# Patient Record
Sex: Male | Born: 1960 | Race: White | Hispanic: No | Marital: Single | Smoking: Current every day smoker
Health system: Southern US, Community
[De-identification: ages and names within clinical notes are randomized; demographics above are authoritative.]

## PROBLEM LIST (undated history)

## (undated) DIAGNOSIS — J439 Emphysema, unspecified: Secondary | ICD-10-CM

## (undated) DIAGNOSIS — I2510 Atherosclerotic heart disease of native coronary artery without angina pectoris: Secondary | ICD-10-CM

## (undated) DIAGNOSIS — Z789 Other specified health status: Secondary | ICD-10-CM

## (undated) DIAGNOSIS — I739 Peripheral vascular disease, unspecified: Secondary | ICD-10-CM

## (undated) DIAGNOSIS — E785 Hyperlipidemia, unspecified: Secondary | ICD-10-CM

## (undated) DIAGNOSIS — J449 Chronic obstructive pulmonary disease, unspecified: Secondary | ICD-10-CM

## (undated) DIAGNOSIS — I5189 Other ill-defined heart diseases: Secondary | ICD-10-CM

## (undated) DIAGNOSIS — Z720 Tobacco use: Secondary | ICD-10-CM

## (undated) HISTORY — DX: Tobacco use: Z72.0

## (undated) HISTORY — DX: Other ill-defined heart diseases: I51.89

## (undated) HISTORY — DX: Emphysema, unspecified: J43.9

## (undated) HISTORY — PX: BYPASS GRAFT: SHX909

## (undated) HISTORY — DX: Chronic obstructive pulmonary disease, unspecified: J44.9

## (undated) HISTORY — DX: Hyperlipidemia, unspecified: E78.5

## (undated) HISTORY — DX: Peripheral vascular disease, unspecified: I73.9

## (undated) HISTORY — DX: Atherosclerotic heart disease of native coronary artery without angina pectoris: I25.10

---

## 2003-08-18 ENCOUNTER — Other Ambulatory Visit: Payer: Self-pay

## 2011-12-07 ENCOUNTER — Ambulatory Visit: Payer: Self-pay | Admitting: Rheumatology

## 2013-02-03 ENCOUNTER — Ambulatory Visit: Payer: Self-pay | Admitting: Vascular Surgery

## 2013-02-03 LAB — CREATININE, SERUM: EGFR (Non-African Amer.): >60

## 2014-05-19 ENCOUNTER — Ambulatory Visit: Payer: Self-pay | Admitting: Vascular Surgery

## 2014-05-19 LAB — BASIC METABOLIC PANEL
Anion Gap: 5 — ABNORMAL LOW (ref 7–16)
BUN: 8 mg/dL (ref 7–18)
CHLORIDE: 106 mmol/L (ref 98–107)
CREATININE: 0.87 mg/dL (ref 0.60–1.30)
Calcium, Total: 8.8 mg/dL (ref 8.5–10.1)
Co2: 26 mmol/L (ref 21–32)
EGFR (African American): >60
EGFR (Non-African Amer.): >60
GLUCOSE: 89 mg/dL (ref 65–99)
Osmolality: 272 (ref 275–301)
Potassium: 4.8 mmol/L (ref 3.5–5.1)
Sodium: 137 mmol/L (ref 136–145)

## 2014-08-17 ENCOUNTER — Ambulatory Visit: Payer: Self-pay | Admitting: Vascular Surgery

## 2014-08-17 LAB — BASIC METABOLIC PANEL
ANION GAP: 4 — AB (ref 7–16)
BUN: 5 mg/dL — AB (ref 7–18)
Calcium, Total: 9 mg/dL (ref 8.5–10.1)
Chloride: 102 mmol/L (ref 98–107)
Co2: 30 mmol/L (ref 21–32)
Creatinine: 0.82 mg/dL (ref 0.60–1.30)
GLUCOSE: 104 mg/dL — AB (ref 65–99)
Osmolality: 270 (ref 275–301)
Potassium: 4.2 mmol/L (ref 3.5–5.1)
Sodium: 136 mmol/L (ref 136–145)

## 2014-08-17 LAB — CBC
HCT: 43.7 % (ref 40.0–52.0)
HGB: 14.7 g/dL (ref 13.0–18.0)
MCH: 30.4 pg (ref 26.0–34.0)
MCHC: 33.5 g/dL (ref 32.0–36.0)
MCV: 91 fL (ref 80–100)
PLATELETS: 263 10*3/uL (ref 150–440)
RBC: 4.83 10*6/uL (ref 4.40–5.90)
RDW: 14.3 % (ref 11.5–14.5)
WBC: 8.6 10*3/uL (ref 3.8–10.6)

## 2014-08-23 ENCOUNTER — Ambulatory Visit: Payer: Self-pay | Admitting: Vascular Surgery

## 2014-08-23 LAB — URINALYSIS, COMPLETE
BACTERIA: NONE SEEN
Bilirubin,UR: NEGATIVE
Blood: NEGATIVE
Glucose,UR: NEGATIVE mg/dL (ref 0–75)
Ketone: NEGATIVE
Leukocyte Esterase: NEGATIVE
Nitrite: NEGATIVE
PH: 6 (ref 4.5–8.0)
PROTEIN: NEGATIVE
RBC,UR: 3 /HPF (ref 0–5)
SPECIFIC GRAVITY: 1.021 (ref 1.003–1.030)
Squamous Epithelial: NONE SEEN
WBC UR: NONE SEEN /HPF (ref 0–5)

## 2014-08-23 LAB — PROTIME-INR
INR: 0.9
Prothrombin Time: 12.3 secs (ref 11.5–14.7)

## 2014-08-23 LAB — APTT: ACTIVATED PTT: 27.5 s (ref 23.6–35.9)

## 2014-08-25 ENCOUNTER — Inpatient Hospital Stay: Payer: Self-pay | Admitting: Vascular Surgery

## 2014-08-25 LAB — MRSA PCR SCREENING

## 2014-08-25 LAB — HEMATOCRIT: HCT: 37.9 % — ABNORMAL LOW (ref 40.0–52.0)

## 2014-08-25 LAB — HEMOGLOBIN: HGB: 12.5 g/dL — ABNORMAL LOW (ref 13.0–18.0)

## 2014-08-26 LAB — CBC WITH DIFFERENTIAL/PLATELET
BASOS ABS: 0.1 10*3/uL (ref 0.0–0.1)
Basophil %: 0.5 %
EOS ABS: 0 10*3/uL (ref 0.0–0.7)
Eosinophil %: 0.1 %
HCT: 35.7 % — ABNORMAL LOW (ref 40.0–52.0)
HGB: 11.9 g/dL — AB (ref 13.0–18.0)
Lymphocyte #: 1.7 10*3/uL (ref 1.0–3.6)
Lymphocyte %: 15.1 %
MCH: 30.3 pg (ref 26.0–34.0)
MCHC: 33.4 g/dL (ref 32.0–36.0)
MCV: 91 fL (ref 80–100)
MONO ABS: 1.2 x10 3/mm — AB (ref 0.2–1.0)
Monocyte %: 10.5 %
NEUTROS PCT: 73.8 %
Neutrophil #: 8.3 10*3/uL — ABNORMAL HIGH (ref 1.4–6.5)
Platelet: 157 10*3/uL (ref 150–440)
RBC: 3.93 10*6/uL — AB (ref 4.40–5.90)
RDW: 14 % (ref 11.5–14.5)
WBC: 11.3 10*3/uL — AB (ref 3.8–10.6)

## 2014-08-26 LAB — BASIC METABOLIC PANEL
ANION GAP: 8 (ref 7–16)
BUN: 9 mg/dL (ref 7–18)
CHLORIDE: 106 mmol/L (ref 98–107)
CO2: 26 mmol/L (ref 21–32)
Calcium, Total: 7.5 mg/dL — ABNORMAL LOW (ref 8.5–10.1)
Creatinine: 0.75 mg/dL (ref 0.60–1.30)
EGFR (African American): >60
EGFR (Non-African Amer.): >60
Glucose: 104 mg/dL — ABNORMAL HIGH (ref 65–99)
Osmolality: 278 (ref 275–301)
POTASSIUM: 4.1 mmol/L (ref 3.5–5.1)
Sodium: 140 mmol/L (ref 136–145)

## 2014-08-28 LAB — CBC WITH DIFFERENTIAL/PLATELET
Basophil #: 0.1 10*3/uL (ref 0.0–0.1)
Basophil %: 0.9 %
EOS ABS: 0.3 10*3/uL (ref 0.0–0.7)
Eosinophil %: 3.3 %
HCT: 34.3 % — AB (ref 40.0–52.0)
HGB: 11.7 g/dL — AB (ref 13.0–18.0)
LYMPHS ABS: 1.5 10*3/uL (ref 1.0–3.6)
LYMPHS PCT: 14.5 %
MCH: 30.4 pg (ref 26.0–34.0)
MCHC: 34 g/dL (ref 32.0–36.0)
MCV: 89 fL (ref 80–100)
Monocyte #: 1 x10 3/mm (ref 0.2–1.0)
Monocyte %: 10.3 %
NEUTROS ABS: 7.2 10*3/uL — AB (ref 1.4–6.5)
Neutrophil %: 71 %
Platelet: 165 10*3/uL (ref 150–440)
RBC: 3.84 10*6/uL — ABNORMAL LOW (ref 4.40–5.90)
RDW: 13.8 % (ref 11.5–14.5)
WBC: 10.2 10*3/uL (ref 3.8–10.6)

## 2014-08-28 LAB — BASIC METABOLIC PANEL
Anion Gap: 11 (ref 7–16)
BUN: 6 mg/dL — AB (ref 7–18)
Calcium, Total: 7.8 mg/dL — ABNORMAL LOW (ref 8.5–10.1)
Chloride: 103 mmol/L (ref 98–107)
Co2: 24 mmol/L (ref 21–32)
Creatinine: 0.65 mg/dL (ref 0.60–1.30)
EGFR (African American): >60
EGFR (Non-African Amer.): >60
Glucose: 72 mg/dL (ref 65–99)
Osmolality: 272 (ref 275–301)
Potassium: 3.6 mmol/L (ref 3.5–5.1)
Sodium: 138 mmol/L (ref 136–145)

## 2014-08-28 LAB — MAGNESIUM: MAGNESIUM: 1.6 mg/dL — AB

## 2015-02-04 NOTE — Discharge Summary (Signed)
PATIENT NAME:  [name], [name] MR#:  [id number] DATE OF BIRTH:  Mar 09, 1961  DATE OF ADMISSION:  02/03/2013 DATE OF DISCHARGE:  02/05/2013  DIAGNOSES: 1.  Atherosclerotic occlusive disease, bilateral lower extremities, with rest pain.  2.  Complication of vascular device.  3.  Tobacco abuse.   POSTOPERATIVE DIAGNOSES:  1.  Atherosclerotic occlusive disease, bilateral lower extremities, with rest pain.  2.  Complication of vascular device.  3.  Tobacco abuse.   PROCEDURES PERFORMED: 1.  Angiography, with angioplasty and stenting of the iliac arteries bilaterally, 02/03/2013.  2.  Angiography of the left lower extremity, with thrombectomy and angioplasty, 02/04/2013.   HISTORY OF PRESENT ILLNESS: The patient is a 54 year old gentleman who presented to the office with increasing pain in his left lower extremity. He had a history of angioplasty and stent placement in the iliac arteries at an outside institution, and was now found to have significant ischemia. The risks and benefits for re-intervention were discussed with the patient, and he wished to proceed.   HOSPITAL COURSE: On the day of admission he underwent successful angiography with recanalization of an occluded left common iliac stent. He also had a greater than 80% stenosis of the right external iliac. Common iliac lesions as well as the right external iliac lesion were treated with angioplasty and stent placement, as described in the operative reports. There were no immediate complications noted, however several hours after the study he was noted to have increased pain in his left leg, with coolness of the toes. His left leg remained warm, and on physical examination he had an easily palpable popliteal pulse as well as a femoral pulse, indicating patency of his recent intervention. He was started on Integrilin and observed overnight. It did not improve, and was returned to the angiography suite yesterday, 02/04/2013, where several  atherosclerotic lesions were identified as well as a probable small embolism associated with his iliac intervention. He underwent successful mechanical thrombectomy associated with t-PA infusion for lysis and then angioplasty of the distal popliteal tibioperoneal trunk as well as the posterior tibial. Following the procedure, he had a palpable posterior tibial at the ankle and his pain had resolved. He was observed overnight and continued on his Integrilin, which was stopped this morning. He is now pain-free and doing well. He is fit for discharge. He is discharged to home. He will follow up with me in the office in several weeks.   Diet is healthy-heart.   Is Plavix is added to his aspirin.   Tobacco cessation was stressed.    ____________________________ Anthony DillsGregory G. Anthony Wolf, White ggs:dm D: 02/05/2013 13:03:02 ET T: 02/05/2013 13:11:38 ET JOB#: [id number]  cc: Anthony DillsGregory G. Anthony Orr, White, <Dictator> Anthony LopeJeffrey D. Anthony SheenSparks, White Anthony DillsGREGORY G Anthony Eichel White ELECTRONICALLY SIGNED 02/23/2013 13:48

## 2015-02-04 NOTE — Op Note (Signed)
PATIENT NAME:  [name], [name] MR#:  [id number] DATE OF BIRTH:  09-17-1961  DATE OF PROCEDURE:  02/04/2013  PREOPERATIVE DIAGNOSES: 1.  Peripheral arterial disease with rest pain left lower extremity.  2.  Ischemia left lower extremity.  3.  Possible embolization.   POSTOPERATIVE DIAGNOSES:   1.  Peripheral arterial disease with rest pain left lower extremity.  2.  Ischemia left lower extremity.  3.  Embolization.   PROCEDURES PERFORMED: 1.  Left lower extremity angiography, third order catheter placement with additional third order.  2.  Percutaneous transluminal angioplasty of the left popliteal artery.  3.  Percutaneous transluminal angioplasty of the left posterior tibial artery.  4.  Mechanical thrombectomy of the popliteal artery.  5.  Mechanical thrombectomy of the posterior tibial artery on the left.  6.  Mechanical thrombectomy of the anterior tibial artery on the left.  7.  Infusion thrombolysis initial day.   SURGEON: Renford DillsGregory G Alante Weimann, MD   SEDATION: Versed 5 mg plus fentanyl 250 mcg administered IV. Continuous ECG, pulse oximetry and cardiopulmonary monitoring was performed throughout the entire procedure by the interventional radiology nurse. Total sedation time was 1 hour, 30 minutes.   ACCESS: A 6-French left common femoral artery, antegrade direction.   CONTRAST USED: Isovue 65 mL.   FLUOROSCOPY TIME:  10.5 minutes.   INDICATIONS: The patient is a 54 year old gentleman who underwent successful recanalization of his iliac artery. Last night he developed significant pain in his toes and on examination has maintained a 4+ popliteal pulse but does not have palpable pedal pulses. The risks and benefits for angiography were reviewed with the patient as well as his mother. All questions are answered, and he has agreed to proceed.   DESCRIPTION OF PROCEDURE: The patient is taken to special procedures and placed in the supine position. After adequate sedation is achieved,  the left groin is prepped and draped in a sterile fashion. Ultrasound is placed in a sterile sleeve. Ultrasound is utilized secondary to lack of appropriate landmarks and to avoid vascular injury. Under direct ultrasound visualization, the common femoral artery is identified, femoral bifurcation is also identified, and then the artery is scanned more proximally. A site estimated to be at least 2 to 3 cm above the bifurcation is selected. One percent lidocaine is infiltrated, and a Micropuncture needle is used to access the artery under direct visualization. Artery is pulsatile and echolucent, indicating patency. Image is recorded for the permanent record. Microwire is then advanced, Microsheath, J-wire followed by a 5-French sheath.  The 5-French sheath is found to be in the profunda, and it is then negotiated with a floppy Glidewire so that it is advanced into the SFA. Hand injection of contrast is then used to demonstrate the distal runoff down to the popliteal where there is an occlusion at the level of the tibial plateau, including the tibioperoneal trunk, origin of the anterior tibial and origin of the posterior tibial.   Heparin, 4000 units, is given. A wire and catheter are negotiated down and the lesion is crossed.  A Magic Torque Wire is exchanged, and 2 mg of cath flow are then reconstituted in 50 mL total volume, and this is laced through the occluded segment using the AngioJet. A 30-minute dwell time is allowed for thrombolysis using the TPA.   Multiple passes are then made across the lesion with the AngioJet in an aspiration. Total volume was 55 mL.  Follow-up demonstrated residual material present, and a 3 and subsequently a 4  mm balloon were used to angioplasty this area. Following angioplasty, there was still some residual material noted in the distal TP trunk.  A calcific stenosis was also identified in the TP trunk just above the origin of the posterior tibial, and this is what required the 4  mm balloon dilatation. Follow-up angiography now demonstrated there was thrombus within the AT and poor flow in the posterior tibial. Catheter and wire were then negotiated into posterior tibial representing first third order catheter placement, and subsequently a 3 mm balloon inflation was used in the mid posterior tibial within a stenotic area. A small amount of thrombotic material was also noted distally, and this was treated with AngioJet mechanical thrombectomy at the level of the malleolus. Follow-up imaging demonstrated significant improvement. Mechanical thrombectomy was then performed of the anterior tibial as well as the catheter was negotiated into the anterior tibial, representing the additional third order catheter placement. At the conclusion, there was flow in all 3 tibias.  There did appear to be some diffuse spasm, and a total of 400 mcg of nitroglycerin were administered intra-arterially.   Oblique view of the left groin was then obtained, and a Mynx device was deployed successfully.  Pressure was held and a safeguard was applied. The patient will be maintained overnight on an Integrilin drip.   INTERPRETATION: Initial views demonstrate that the common femoral, profunda femoris, and superficial femoral as well as the proximal 2/3 of the popliteal are widely patent. There is an occlusion which I am suspicious was a combination of calcific disease, as the lesion was identified on fluoroscopy, as well as a possible embolization from his earlier intervention. This was treated with both TPA infusion as well as a mechanical thrombectomy and a 4 mm balloon inflation. The anterior tibial and posterior tibial were both then treated with mechanical thrombectomy.  The posterior tibial had a lesion in its mid portion which was treated with a 3 mm balloon inflation.   SUMMARY: Successful salvage of left lower extremity as described above. At the conclusion of the procedure, the patient has a 4+ palpable  posterior tibial pulse.  ____________________________ Renford Dills, MD ggs:cb D: 02/04/2013 16:53:53 ET T: 02/04/2013 17:28:13 ET JOB#: [id number]  cc: Renford Dills, MD, <Dictator> Duane Lope. Judithann Sheen, MD Renford Dills MD ELECTRONICALLY SIGNED 02/23/2013 13:48

## 2015-02-04 NOTE — Op Note (Signed)
PATIENT NAME:  [name], [name] MR#:  [id number] DATE OF BIRTH:  20-Jan-1961  DATE OF PROCEDURE:  02/03/2013  PREOPERATIVE DIAGNOSIS: Atherosclerotic occlusive disease bilateral lower extremities, with rest pain of the left lower extremity and lifestyle-limiting claudication of the right.   POSTOPERATIVE DIAGNOSIS: Atherosclerotic occlusive disease bilateral lower extremities, with rest pain of the left lower extremity and lifestyle-limiting claudication of the right.  PROCEDURES PERFORMED: 1.  Abdominal aortogram.  2.  Pelvic angiography.  3.  Percutaneous transluminal angioplasty of the right common iliac artery.  4.  Percutaneous transluminal angioplasty and stent placement of the left common iliac artery.  5.  Percutaneous transluminal angioplasty and stent placement of the right external iliac artery.  6.  Bilateral StarClose devices common femoral arteries.   SURGEON: Levora Dredge, MD   SEDATION: Precedex drip with IV fentanyl. Continuous ECG, pulse oximetry and cardiopulmonary monitoring is performed throughout the entire procedure by the interventional radiology nurse. Total sedation time is 1 hour, 20 minutes.   ACCESS:  1.  A 7-French sheath, left common femoral artery.  2.  A 6-French sheath, right common femoral artery.   CONTRAST USED: Isovue 95 mL.   FLUOROSCOPY TIME: 8.0 minutes.   INDICATIONS: The patient is a 54 year old gentleman who presents with increasing pain of his left lower extremity and lifestyle-limiting claudication that has been a problem over the past many months bilaterally.  Physical examination as well as noninvasive studies confirm profound atherosclerotic occlusive disease, particularly on the left. The risks and benefits for angiography and possible intervention were reviewed. All questions were answered, and the patient agrees to proceed.  DESCRIPTION OF PROCEDURE: The patient is taken to special procedures and placed in the supine position. After  adequate sedation is achieved, both groins are prepped and draped in sterile fashion. Ultrasound is placed in a sterile sleeve. Ultrasound is utilized secondary to lack of appropriate landmarks and to avoid vascular injury. Under direct ultrasound visualization, access to the common femoral artery on the right is obtained. The common femoral artery is identified. It is pulsatile and echolucent, indicating patency. Image is recorded for the permanent record, and a Micropuncture needle is used to access the anterior wall under direct visualization, a Microwire followed by micro sheath, a J-wire followed by a 5-French sheath and a 5-French pigtail catheter. The pigtail catheter and J-wire are then advanced to the level of T12, and AP projection of the aorta is obtained. The pigtail catheter is then repositioned, and bilateral oblique views of the pelvis are obtained. It should be noted that with the sheath crossing the external iliac lesion it is occlusive. After imaging this, the sheath was repositioned so that it is below the level of the stenosis; 3000 units of heparin is given. The ultrasound previously used was maintained on the field. It is then used to image the left groin. The common femoral artery is identified. It is minimally pulsatile but it is echolucent, indicating patency. Patency is also noted from the angiography via collaterals reconstituting the external iliac and then filling a patent common femoral on the left. Image is recorded for the permanent record, and under direct ultrasound visualization a Microwire is utilized to access the common femoral artery, followed by a micro sheath, a J-wire followed by a 6-French sheath. Right side is upsized over the J-wire to a 6-French sheath as well.   Using a Stiff-Angled Glidewire and KMP catheter, the occluded common iliac artery and previously placed iliac stent are then crossed.  The  catheter is advanced into the aorta where blood is easily aspirated,  and hand injection of contrast demonstrates luminal filling.   Another 1000 units of heparin is given. Two 7 x 6 balloons are then used to dilate the right and left common iliac arteries simultaneously. Follow-up imaging on the left demonstrates significant residual stenosis. There does appear to be some thrombus noted in the distal native common iliac just below the stent. There is extensive filling of the internal iliac and then retrograde filling into the external iliac on the left. Given the appearance, clearly the patient will require stenting on the left, and it is elected to use Atrium Stents; an 8 x 59, and subsequently an additional 8 x 38 stent is then utilized to treat the entire length of the common iliac. In RAO projections with magnified views, the bifurcation of the internal and external iliacs are identified, and the second Atrium Stent is deployed so that the distal edge of the stent comes right down to the bifurcation essentially covering the common iliac artery completely. The initial stent placed also is elevated into the aorta by another 4 or 5 mm.  Again, simultaneous inflations on the right and left are performed as is typically seen in the kissing balloon fashion. A 9 x 2 balloon is then used to dilate the leading edge of the left Atrium Stent as well as the overlapped segment. Follow-up angiography via a pigtail advanced through the right side demonstrates there is now wide patency, iliac bifurcation is widely patent, as is the femoral bifurcation.   Attention is then turned to the external iliac lesion on the right, and a 6 x 28 Herculink Stent is advanced through the sheath and deployed across this lesion.  Inflation is to 14 atmospheres for 1 minute. Follow-up angiography demonstrates wide patency of the external iliac and filling of the common femoral, profunda femoris and proximal superficial femoral.   The pigtail catheter is then reintroduced, and oblique views of the pelvis are  obtained. After review of the images, oblique views of each groin are obtained, and StarClose devices are deployed without difficulty.   INTERPRETATION: The aorta is opacified with a bolus injection of contrast. There is a mild shelf-like projection in the distal aorta, but there is no evidence of a hemodynamically significant lesion within the aorta. On the right, there is moderate 50 to 60% restenosis within the common iliac artery stent. There is a high-grade greater than 85% stenosis of the right external iliac just distal to the origin. On the left, there is complete occlusion of the common iliac. There is reconstitution via extensive collaterals via the internal and then filling of the external common femoral and profunda femoris superficial femoral on the left.  Common femoral, profunda femoris and proximal superficial femoral on the right are widely patent.   After angioplasty of the common iliacs and placement of Atrium Stents on the left, there is now wide patency of the common iliac system bilaterally. After angioplasty and stenting of the right external, there is wide patency as well.   SUMMARY: Successful recanalization of the aortoiliac system as described above.   ____________________________ Renford DillsGregory G. Schnier, MD ggs:cb D: 02/04/2013 14:47:11 ET T: 02/04/2013 15:12:59 ET JOB#: [id number]  cc: Renford DillsGregory G. Schnier, MD, <Dictator> Duane LopeJeffrey D. Judithann SheenSparks, MD Renford DillsGREGORY G SCHNIER MD ELECTRONICALLY SIGNED 02/23/2013 13:47

## 2015-02-05 NOTE — Op Note (Signed)
PATIENT NAME:  [name], [name] MR#:  [id number] DATE OF BIRTH:  [birth year]  DATE OF PROCEDURE:  05/19/2014  PREOPERATIVE DIAGNOSES: 1. Atherosclerotic occlusive disease bilateral lower extremities, with rest pain of the left lower extremity.  2. Complication of vascular device with occlusion of left common iliac artery stents.  3. Complication of vascular device with occlusion of right external iliac artery stents.   PROCEDURES PERFORMED: 1. Introduction catheter into aorta, left femoral approach.  2. Introduction catheter into aorta, right femoral approach.  3. AngioJet thrombectomy of the left common iliac.  4. Percutaneous transluminal angioplasty and stent placement, left common iliac.   SURGEON: Katha Cabal, M.D.   SEDATION: Versed 5 mg plus fentanyl 200 mcg administered IV. Continuous ECG, pulse oximetry and cardiopulmonary monitoring is performed throughout the entire procedure by the interventional radiology nurse. Total sedation time was 1 hour, 30 minutes.   ACCESS:  1. A 6 French sheath, left common femoral artery.  2. A 5 French sheath, right common femoral artery.   CONTRAST USED: Isovue  FLUOROSCOPY TIME: 20.2 minutes.   INDICATIONS: [name] is a 54 year old gentleman, who has had multiple vascular procedures in the past, presented to the office with increasing pain in his left leg, and is found to have occlusion of the left common iliac artery as well as the right external iliac artery. The risks and benefits for angiography and possible intervention are reviewed. All questions are answered. The patient agrees to proceed.   DESCRIPTION OF PROCEDURE: The patient is taken to the special procedure suite, placed in the supine position. After adequate sedation is achieved, both groins are prepped and draped in sterile fashion. Ultrasound is placed in a sterile sleeve. The left common femoral artery is identified. It is echolucent and minimally pulsatile,  indicating patency. Image is recorded for the permanent record and access is obtained with a micropuncture needle under direct visualization. Microwire followed by micro sheath, J-wire followed by a 5 French sheath.   Using a combination of Glidewire and a Kumpe catheter, the occluded iliac is crossed. Subsequently, the Kumpe is advanced into the aorta and hand injection of contrast is used to verify intraluminal placement.   The right femoral artery is then identified with ultrasound. It is echolucent and minimally pulsatile, indicating patency. Image is recorded, and access is obtained in a similar fashion using a micropuncture kit. A significant amount of the fluoroscopy time used in this case was utilized in attempting to cross the occluded stent in the right external iliac. Unfortunately, a luminal pathway was never obtained. There was a subintimal plane created up to the common iliac. Multiple different wires, as well as several different catheters were utilized.   TPA 6 mg was then reconstituted in 50 mL, and using the AngioJet DVX device, this TPA was laced cross the occluded left common iliac. It was allowed to dwell for 30 minutes and then the DVX device was utilized for aspiration. The iliac artery was still occluded after this process by angiography, and, therefore, an 8 x 4 balloon was used to angioplasty, inflations were for 30 seconds to 12 to 14 atmospheres. Following inflation, there is now contrast flowing through the left common iliac. The right common iliac stent appears to be unchanged. There is a greater than 60% focal stenosis at the distal margin of the stent, and, therefore, it is elected to place an 8 x 19 Omnilink stent. Inflation was to 16 atmospheres for 30 seconds. Follow-up imaging now demonstrates  that the iliac on the left is patent. There does appear to be modest narrowing, less than 30%, at its leading edge proximally; however, given that crossing the right external iliac  occlusion was not attained, kissing balloons cannot be utilized to treat this area any further.   Oblique view was then obtained of the left groin after distal runoff has been obtained with hand injection through the 6 French sheath and a StarClose device deployed. The right sheath is pulled and pressure is held. There were no immediate complications.   INTERPRETATION: The aorta is opacified with a bolus of contrast. There does not appear to be any focal hemodynamically significant stenoses noted. There is approximately a 40% diameter reduction of the right common iliac stent. The right external iliac is occluded and there is a very large internal iliac on the right that collateralizes extensively with the profunda femoris and common femoral.   The left common iliac is occluded, internal and external are patent. Common femoral, profunda femoris, superficial femoral, popliteal and trifurcation are patent on the left.   After AngioJet, there was still no flow within the occluded segment; therefore, angioplasty was performed to 8 mm, and subsequently in a focal area where there was high-grade residual stenosis, an 8 x 19 balloon expandable stent was placed.   SUMMARY: Successful recanalization of the left common iliac as described above.    ____________________________ Katha Cabal, MD ggs:jr D: 05/19/2014 12:20:01 ET T: 05/19/2014 13:19:43 ET JOB#: [id number]  cc: Katha Cabal, MD, <Dictator> Katha Cabal MD ELECTRONICALLY SIGNED 05/25/2014 17:17

## 2015-02-05 NOTE — Discharge Summary (Signed)
PATIENT NAME:  [name], [name] MR#:  [id number] DATE OF BIRTH:  02-14-1961  DATE OF ADMISSION:  08/25/2014 DATE OF DISCHARGE:  08/29/2014   DIAGNOSES: Atherosclerotic occlusive disease, bilateral lower extremities, with lifestyle limiting claudication and mild rest pain.   PROCEDURE PERFORMED:  1. Aortobifemoral bypass grafting, 08/25/2014.  2. Endarterectomy of the common femoral and superficial femoral artery, right side.  3. Endarterectomy of the profunda femoris artery right side.   HISTORY: [name] is a 54 year old gentleman, who is still working and has had increasing difficulty with ambulation and maintaining his job secondary to his atherosclerotic occlusive disease. He has had several interventions on past, which have not been durable reconstructions. Because of his very physical occupation, I have discussed with him aortobifemoral bypass grafting and he has agreed to proceed.   HOSPITAL COURSE: On the day of admission, he underwent aortobifemoral bypass grafting without incident. He was hemodynamically stable throughout the surgery and was taken to the recovery room, where he extubated and did well. Over the course of the next 4 postoperative days, he had excellent control of his pain. He had return of his bowel function, was tolerating a regular diet. On postoperative day #4, he is felt fit for discharge. He  is discharged to home. He will follow up with me for staple removal in 2 weeks. He is not to return to work until after seen by me, no heavy lifting, no exertional activities. Medications are and unchanged from admission with the addition of Percocet for control of his pain. Diet is healthy heart. Condition on discharge is improved.   ____________________________ Renford DillsGregory G. Ivanell Deshotel, MD ggs:ap D: 09/20/2014 16:25:48 ET T: 09/20/2014 16:37:36 ET JOB#: [id number]  cc: Renford DillsGregory G. Reymundo Winship, MD, <Dictator> Renford DillsGREGORY G Britzy Graul MD ELECTRONICALLY SIGNED 09/24/2014 11:27

## 2015-02-05 NOTE — Op Note (Signed)
PATIENT NAME:  [name], [name] MR#:  [id number] DATE OF BIRTH:  24-Jun-1961  DATE OF PROCEDURE:  08/25/2014  PREOPERATIVE DIAGNOSES:  1.  Atherosclerotic occlusive disease bilateral lower extremities, with rest pain bilateral lower extremities.  2.  Complication of vascular device with occlusion/thrombosis of vascular stents.  3.  Tobacco abuse.   POSTOPERATIVE DIAGNOSES: 1.  Atherosclerotic occlusive disease bilateral lower extremities, with rest pain bilateral lower extremities.  2.  Complication of vascular device with occlusion/thrombosis of vascular stents.  3.  Tobacco abuse.   PROCEDURES PERFORMED:  1.  Aortobifemoral bypass grafting.  2.  Endarterectomy of the common femoral and superficial femoral artery.  3.  Endarterectomy of the profunda femoris.   SURGEON: Renford Dills, MD   CO-SURGEON: Annice Needy, MD  ANESTHESIA: General by endotracheal intubation.   FLUIDS: Per anesthesia record.   ESTIMATED BLOOD LOSS: 900 mL with approximately 700 returned in [facility].   SPECIMEN:  1.  Plaque from the common femoral and SFA as one specimen, right side common and superficial femoral, right deep femoral. 2.  Plaque from the deep femoral as a second specimen, right side common and superficial femoral, right deep femoral.   INDICATIONS: [name] is a 54 year old gentleman who has increasing pain in both of his legs, right side more so than left. Physical examination as well as noninvasive studies demonstrate profound ischemia, he describes rest pain like symptoms. He has undergone multiple angiograms with stenting, but these have not provided a durable reconstruction and he is therefore undergoing aortobifemoral bypassing. Risks and benefits were reviewed. All questions answered. The patient has agreed to proceed.   DESCRIPTION OF PROCEDURE: The patient is taken to the operating room and placed in the supine position. After adequate general anesthesia is induced and  appropriate invasive monitors are placed, he is positioned supine. He is then prepped from the nipple line down to the knees and then draped in a sterile fashion. Appropriate timeout is called.   The vertical incisions are then made in both groins and the dissection is carried down to expose the common femoral artery from the level of the ilioinguinal ligament down to the bifurcation. Dissection on the right side is somewhat more tedious secondary to fairly dense scar tissue on the left side. There is moderate scar tissue secondary to previous angiograms. StarClose devices are identified bilaterally. Dr. Wyn Quaker is working on the left while I am working on the right simultaneously. Once both groins have been exposed and the common femoral, profunda femoris and superficial femoral artery are looped with Silastic vessel loops, they are packed with saline moistened laparotomy pads.   Midline incision is then created and carried down to expose the fascia. The fascia is incised and the peritoneal cavity is opened without difficulty. The viscera are then inspected and then subsequently the colon is packed superiorly above the liver and the small intestines are swept into the right gutter and packed with a blue towel.   The retroperitoneal tissues are then opened, and using a combination of a Bovie cautery and scissors the dissection is carried northward up to the level of the left renal vein. The aorta is then dissected at this level. The IMA is looped with the Silastic vessel loops circumferentially and the bifurcation is exposed.   With myself working on the right, a tunnel is created staying immediately on top of the iliac artery identifying the ureter because he is so thin and ensuring that the umbilical tape is  passed posterior to the ureter. Umbilical tape is then secured from the groin the aortic bifurcation. In a similar fashion, Dr. Wyn Quaker created a tunnel on the left.   Heparin 5000 units was given and  allowed to circulate for approximately 5 minutes.   The aorta is then clamped proximally and distally. Arteriotomy is made and extended with Potts scissors. A lumen aorta appears very reasonable for bypass. A 16 x 8 collagen-impregnated Dacron graft is then delivered onto the field, soaked in sterile saline. It is then beveled to an appropriate shape and an end graft to side aorta anastomosis fashioned with 3-0 Prolene in a running fashion. Flushing maneuvers are performed and flow is then re-established to the distal aorta and IMA. Graft is irrigated copiously with heparinized saline and clamped just above the suture line.   The right limb is then pulled through the right tunnel, the left limb pulled through the left tunnel.   Because the left femoral appears to be in better condition, it was elected to perform this one first so that flow could be re-established. The arteriotomy is made slightly off-center and medially and extended with Potts scissors, 6-0 Prolene stay sutures are placed. The limb of the graft is checked for appropriate length and tension, and then subsequently beveled in graft to side, common femoral artery anastomosis is fashioned with running 4-0 Prolene. Flushing maneuvers are performed and flow is established, first in a retrograde fashion, then down the profunda and then down the SFA to prevent distal embolization. Distally, pulses are noted in the SFA and the profunda. Suture line is hemostatic. The wound is then packed with a laparotomy pad.   Attention is then turned to the right common femoral artery, which is controlled with Silastic vessel loops. Arteriotomy is made with an 11 blade and extended with Potts scissors. Extensive thromboatheromatous debris is noted, essentially occluding the common femoral and extending into the deep femoral as well as the superficial femoral. The arteriotomy is then extended with the Potts proximally and distally so that it extends approximately  0.5 cm, onto the SFA. The exposed origin of the profunda femoris is quite stenotic.   Beginning with the common femoral, endarterectomy is performed under direct visualization from the level of the ilioinguinal ligament down to the origin of the SFA. The plaque is then transected approximately 4 to 5 mm into the SFA with the Potts scissors and subsequently secured with multiple interrupted 6-0 Prolene stay sutures.   The profunda femoris is then treated using an eversion technique very similar to VAC used to the external carotid artery during carotid endarterectomy. Working with [name] forceps to control backbleeding, the profunda femoris is treated until a feathered edge is obtained. This is passed off as a second segment. The profundus is then irrigated with heparinized saline and controlled with a Silastic vessel loop.   The right limb of the graft is then checked for appropriate length and tension, beveled to cover the entire arteriotomy and an end graft to side common femoral, proximal SFA anastomosis is fashioned with running 4-0 Prolene. Flushing maneuvers are performed and subsequently flow is re-established, first retrograde, then down the profunda and then down the SFA to prevent distal embolization.   The suture line has 1 or 2 spots which are controlled with a single 5-0 Prolene suture. Subsequently, both anastomoses are re-evaluated and then packed with moistened gauze.   The proximal anastomosis once again inspected. Surgicel plus 5 mL of Evicel are placed in the  retroperitoneal space and along the suture line and then the retroperitoneal tissues are reapproximated using running 0 Vicryl. The viscera are then returned to their anatomic location and the omentum is placed over the midline. The fascia is then reapproximated using looped #1 PDS in a running fashion with interspersed [place] knots.   The groins are then closed simultaneously with Dr. Wyn Quakerew working on the left, myself working on  the right. Multiple layers are utilized, including two layers of 2-0 Vicryl, followed by three layers of 3-0 Vicryl, followed by 4-0 Monocryl subcuticular. Evicel and Surgicel were placed around the suture lines in the bed of each of these wounds as well.   The skin of the midline incision in the abdominal wall is then closed with staples. Sterile dressings are applied to all 3 incisions. The patient is awakened in the operating room and extubated, moving all extremities. He is in stable condition and transferred to the recovery area.   ____________________________ Renford DillsGregory G. Schnier, MD ggs:TT D: 08/25/2014 13:21:49 ET T: 08/25/2014 14:08:57 ET JOB#: [id number]  cc: Renford DillsGregory G. Schnier, MD, <Dictator> Duane LopeJeffrey D. Judithann SheenSparks, MD Renford DillsGREGORY G SCHNIER MD ELECTRONICALLY SIGNED 09/14/2014 12:59

## 2015-02-05 NOTE — Op Note (Signed)
PATIENT NAME:  [name], [name] MR#:  [id number] DATE OF BIRTH:  [birth year]  DATE OF PROCEDURE:  08/25/2014  PREOPERATIVE DIAGNOSES:  1.  Peripheral arterial disease with ulceration to bilateral lower extremities.  2.  Failed previous percutaneous revascularization.  3.  Chronic obstructive pulmonary disease.  4.  Hyperlipidemia.   POSTOPERATIVE DIAGNOSES: 1.  Peripheral arterial disease with ulceration to bilateral lower extremities.  2.  Failed previous percutaneous revascularization.  3.  Chronic obstructive pulmonary disease.  4.  Hyperlipidemia.   PROCEDURES:  1.  Aortobifemoral bypass with 16 mm diameter proximal 8 mm bifurcated Dacron graft.  2.  Right common femoral, superficial femoral, and profunda femoris endarterectomy.  CO-SURGEONS: Renford Dills, MD and Annice Needy, MD   ANESTHESIA: General.   ESTIMATED BLOOD LOSS: 925 mL.   INDICATION FOR PROCEDURE: A 54 year old gentleman with severe peripheral vascular disease and aortoiliac occlusion. He has ulcerations. He needs revascularization. Risks and benefits were discussed. Informed consent was obtained.   DESCRIPTION OF PROCEDURE: The patient is brought to the operative suite and after an adequate level of general anesthesia obtained, the abdomen and groins were sterilely prepped and draped and a sterile surgical field was created. We began by dissecting out the femoral artery. Dr. Gilda Crease dissected out the right femoral artery and I dissected out the left. These were encircled with vessel loops in the superficial femoral artery, profunda femoris artery and common femoral artery and prepared for control. We then made a long midline laparotomy incision and dissected out, used the Omni Trac retractor to help expose the aorta. The aorta was dissected out. The aorta and the mid to proximal segments were reasonably soft vessels and good for creation of our anastomosis. We dissected to the renal vein most north and then  prepared the artery for clamping. We then tunneled from the groin incisions through the retroperitoneum to the terminal aorta and selected a 16 x 8 bifurcated Dacron graft. After we tunneled, we gave 4000 units of intravenous heparin. The aorta was controlled proximally with the aortic clamp and a hypogastric clamp was used for retrograde control of the aorta. The IMA was encircled with vessel loop and protected from harm. An antral arteriotomy was created with an 11 blade and extended with Potts scissors, and the vessel was prepared for anastomosis. The graft was cut and beveled to an appropriate length leaving a short common lumen before the bifurcation. We created an anastomosis proximally with a 3-0 Prolene suture in the usual fashion. A single 3-0 Prolene patch suture was used for hemostasis and excellent pulsatile flow was seen through the graft. The graft was then clamped and the aorta was packed away. We pulled the limbs down to the femoral arteries. On the left, the femoral artery was controlled with vessel loops, it was opened with an 11 blade and extended with Potts scissors. This vessel was a reasonably good vessel and did not require an endarterectomy. We hooded the graft largely on the common femoral artery down to the proximal superficial femoral artery as his SFA was continuous. At this point, anastomosis was created with a running 4-0 Prolene suture in the usual fashion. The vessel was flushed and de-aired prior to releasing control. On release, there was an excellent pulse and then this artery was packed away. We then turned our attention to the right femoral artery. When this artery was opened, there was thrombus and disease within the femoral artery that required an endarterectomy. This tracked down to the  origin of the SFA, as well as to the origin of the profunda femoris artery. We took care to perform a very gentle eversion endarterectomy on the profunda femoris artery with the [facility] elevator  and get a good distal endpoint with excellent backbleeding. The SFA endpoint was cut flush and tacked down with 6-0 Prolene sutures. A long arteriotomy was created to help complete the endarterectomy proximally and a nice arteriotomy was created for the suture line. The graft was then cut and beveled to an appropriate length to match the arteriotomy and then anastomosis created with a 4-0 Prolene suture in the usual fashion. The vessel was flushed and de-aired prior to releasing control and a single 4-0 Prolene patch suture was used for hemostasis. Wounds were then irrigated. Surgicel and Evicel topical hemostatic agents were placed. Hemostasis was complete. The retroperitoneum was closed with a 0 Vicryl. The abdominal fascia was closed with 2 looped #1 PDS sutures in a running fashion and the skin was coapted with staples. The groin incisions were closed with layered 2-0 Vicryl, two layers of 3-0 Vicryl and a 4-0 Monocryl. Sterile dressings were placed. The patient was awakened from anesthesia and taken to the recovery room in stable condition.    ____________________________ Annice NeedyJason S. Jaydan Chretien, MD jsd:TT D: 08/25/2014 17:12:04 ET T: 08/25/2014 21:07:48 ET JOB#: [id number]  cc: Annice NeedyJason S. Carla Rashad, MD, <Dictator> Annice NeedyJASON S Kaikoa Magro MD ELECTRONICALLY SIGNED 08/30/2014 10:13

## 2015-02-07 LAB — SURGICAL PATHOLOGY

## 2017-08-15 DIAGNOSIS — I2510 Atherosclerotic heart disease of native coronary artery without angina pectoris: Secondary | ICD-10-CM

## 2017-08-15 HISTORY — DX: Atherosclerotic heart disease of native coronary artery without angina pectoris: I25.10

## 2017-09-13 ENCOUNTER — Other Ambulatory Visit: Payer: Self-pay

## 2017-09-13 ENCOUNTER — Inpatient Hospital Stay
Admission: EM | Admit: 2017-09-13 | Discharge: 2017-09-15 | DRG: 247 | Disposition: A | Discharge status: Home / Self Care | Payer: Medicaid Other | Attending: Internal Medicine | Admitting: Internal Medicine

## 2017-09-13 ENCOUNTER — Encounter (HOSPITAL_COMMUNITY): Payer: Self-pay

## 2017-09-13 ENCOUNTER — Ambulatory Visit (HOSPITAL_COMMUNITY): Admit: 2017-09-13 | Payer: Self-pay | Admitting: Cardiology

## 2017-09-13 ENCOUNTER — Encounter
Admission: EM | Disposition: A | Discharge status: Home / Self Care | Payer: Self-pay | Source: Home / Self Care | Attending: Internal Medicine

## 2017-09-13 DIAGNOSIS — I25119 Atherosclerotic heart disease of native coronary artery with unspecified angina pectoris: Secondary | ICD-10-CM | POA: Diagnosis present

## 2017-09-13 DIAGNOSIS — R402413 Glasgow coma scale score 13-15, at hospital admission: Secondary | ICD-10-CM | POA: Diagnosis present

## 2017-09-13 DIAGNOSIS — E785 Hyperlipidemia, unspecified: Secondary | ICD-10-CM | POA: Diagnosis present

## 2017-09-13 DIAGNOSIS — I10 Essential (primary) hypertension: Secondary | ICD-10-CM | POA: Diagnosis present

## 2017-09-13 DIAGNOSIS — F17210 Nicotine dependence, cigarettes, uncomplicated: Secondary | ICD-10-CM | POA: Diagnosis present

## 2017-09-13 DIAGNOSIS — Z95820 Peripheral vascular angioplasty status with implants and grafts: Secondary | ICD-10-CM | POA: Diagnosis not present

## 2017-09-13 DIAGNOSIS — I2102 ST elevation (STEMI) myocardial infarction involving left anterior descending coronary artery: Secondary | ICD-10-CM

## 2017-09-13 DIAGNOSIS — I2129 ST elevation (STEMI) myocardial infarction involving other sites: Secondary | ICD-10-CM | POA: Diagnosis present

## 2017-09-13 DIAGNOSIS — I2109 ST elevation (STEMI) myocardial infarction involving other coronary artery of anterior wall: Secondary | ICD-10-CM | POA: Diagnosis present

## 2017-09-13 DIAGNOSIS — Z7189 Other specified counseling: Secondary | ICD-10-CM

## 2017-09-13 DIAGNOSIS — I213 ST elevation (STEMI) myocardial infarction of unspecified site: Secondary | ICD-10-CM

## 2017-09-13 DIAGNOSIS — I739 Peripheral vascular disease, unspecified: Secondary | ICD-10-CM | POA: Diagnosis present

## 2017-09-13 DIAGNOSIS — I2510 Atherosclerotic heart disease of native coronary artery without angina pectoris: Secondary | ICD-10-CM

## 2017-09-13 DIAGNOSIS — Z8249 Family history of ischemic heart disease and other diseases of the circulatory system: Secondary | ICD-10-CM

## 2017-09-13 DIAGNOSIS — J449 Chronic obstructive pulmonary disease, unspecified: Secondary | ICD-10-CM | POA: Diagnosis present

## 2017-09-13 HISTORY — DX: Other specified health status: Z78.9

## 2017-09-13 HISTORY — PX: LEFT HEART CATH AND CORONARY ANGIOGRAPHY: CATH118249

## 2017-09-13 HISTORY — PX: CORONARY/GRAFT ACUTE MI REVASCULARIZATION: CATH118305

## 2017-09-13 LAB — PROTIME-INR
INR: 0.92
Prothrombin Time: 12.3 seconds (ref 11.4–15.2)

## 2017-09-13 LAB — CBC WITH DIFFERENTIAL/PLATELET
Basophils Absolute: 0.1 10*3/uL (ref 0–0.1)
Basophils Relative: 1 %
Eosinophils Absolute: 0.1 10*3/uL (ref 0–0.7)
Eosinophils Relative: 2 %
HEMATOCRIT: 43.3 % (ref 40.0–52.0)
Hemoglobin: 14.9 g/dL (ref 13.0–18.0)
LYMPHS ABS: 2.4 10*3/uL (ref 1.0–3.6)
LYMPHS PCT: 27 %
MCH: 30.5 pg (ref 26.0–34.0)
MCHC: 34.5 g/dL (ref 32.0–36.0)
MCV: 88.4 fL (ref 80.0–100.0)
MONOS PCT: 11 %
Monocytes Absolute: 0.9 10*3/uL (ref 0.2–1.0)
NEUTROS ABS: 5.1 10*3/uL (ref 1.4–6.5)
Neutrophils Relative %: 59 %
Platelets: 205 10*3/uL (ref 150–440)
RBC: 4.89 MIL/uL (ref 4.40–5.90)
RDW: 13.8 % (ref 11.5–14.5)
WBC: 8.7 10*3/uL (ref 3.8–10.6)

## 2017-09-13 LAB — COMPREHENSIVE METABOLIC PANEL
ALT: 31 U/L (ref 17–63)
AST: 56 U/L — AB (ref 15–41)
Albumin: 3.8 g/dL (ref 3.5–5.0)
Alkaline Phosphatase: 49 U/L (ref 38–126)
Anion gap: 11 (ref 5–15)
BILIRUBIN TOTAL: 0.6 mg/dL (ref 0.3–1.2)
BUN: 9 mg/dL (ref 6–20)
CO2: 23 mmol/L (ref 22–32)
CREATININE: 0.72 mg/dL (ref 0.61–1.24)
Calcium: 9 mg/dL (ref 8.9–10.3)
Chloride: 102 mmol/L (ref 101–111)
GFR calc Af Amer: >60 mL/min (ref >60)
GLUCOSE: 141 mg/dL — AB (ref 65–99)
Potassium: 3.9 mmol/L (ref 3.5–5.1)
Sodium: 136 mmol/L (ref 135–145)
TOTAL PROTEIN: 7.2 g/dL (ref 6.5–8.1)

## 2017-09-13 LAB — BASIC METABOLIC PANEL
ANION GAP: 6 (ref 5–15)
BUN: 9 mg/dL (ref 6–20)
CALCIUM: 8.7 mg/dL — AB (ref 8.9–10.3)
CO2: 26 mmol/L (ref 22–32)
CREATININE: 0.68 mg/dL (ref 0.61–1.24)
Chloride: 102 mmol/L (ref 101–111)
Glucose, Bld: 136 mg/dL — ABNORMAL HIGH (ref 65–99)
Potassium: 3.5 mmol/L (ref 3.5–5.1)
SODIUM: 134 mmol/L — AB (ref 135–145)

## 2017-09-13 LAB — PHOSPHORUS: PHOSPHORUS: 3.7 mg/dL (ref 2.5–4.6)

## 2017-09-13 LAB — URINE DRUG SCREEN, QUALITATIVE (ARMC ONLY)
Amphetamines, Ur Screen: NOT DETECTED
BARBITURATES, UR SCREEN: NOT DETECTED
BENZODIAZEPINE, UR SCRN: POSITIVE — AB
CANNABINOID 50 NG, UR ~~LOC~~: NOT DETECTED
Cocaine Metabolite,Ur ~~LOC~~: POSITIVE — AB
MDMA (Ecstasy)Ur Screen: NOT DETECTED
METHADONE SCREEN, URINE: NOT DETECTED
Opiate, Ur Screen: POSITIVE — AB
Phencyclidine (PCP) Ur S: NOT DETECTED
TRICYCLIC, UR SCREEN: NOT DETECTED

## 2017-09-13 LAB — LIPID PANEL
CHOL/HDL RATIO: 2.5 ratio
Cholesterol: 192 mg/dL (ref 0–200)
HDL: 78 mg/dL (ref >40)
LDL CALC: 100 mg/dL — AB (ref 0–99)
Triglycerides: 68 mg/dL (ref <150)
VLDL: 14 mg/dL (ref 0–40)

## 2017-09-13 LAB — POCT ACTIVATED CLOTTING TIME
Activated Clotting Time: 197 seconds
Activated Clotting Time: >1000 seconds

## 2017-09-13 LAB — TROPONIN I
TROPONIN I: 0.04 ng/mL — AB (ref <0.03)
Troponin I: 14.73 ng/mL (ref <0.03)

## 2017-09-13 LAB — MRSA PCR SCREENING: MRSA BY PCR: NEGATIVE

## 2017-09-13 LAB — MAGNESIUM: Magnesium: 1.7 mg/dL (ref 1.7–2.4)

## 2017-09-13 LAB — APTT: aPTT: <24 seconds — ABNORMAL LOW (ref 24–36)

## 2017-09-13 SURGERY — LEFT HEART CATH AND CORONARY ANGIOGRAPHY
Anesthesia: LOCAL

## 2017-09-13 SURGERY — CORONARY/GRAFT ACUTE MI REVASCULARIZATION
Anesthesia: Moderate Sedation

## 2017-09-13 MED ORDER — IOPAMIDOL (ISOVUE-300) INJECTION 61%
INTRAVENOUS | Status: DC | PRN
  Administered 2017-09-13: 170 mL via INTRA_ARTERIAL

## 2017-09-13 MED ORDER — HEPARIN SODIUM (PORCINE) 1000 UNIT/ML IJ SOLN
INTRAMUSCULAR | Status: AC
  Filled 2017-09-13: qty 1

## 2017-09-13 MED ORDER — NITROGLYCERIN 1 MG/10 ML FOR IR/CATH LAB
INTRA_ARTERIAL | Status: DC | PRN
  Administered 2017-09-13: 200 ug via INTRACORONARY

## 2017-09-13 MED ORDER — ATORVASTATIN CALCIUM 20 MG PO TABS
80.0000 mg | ORAL_TABLET | Freq: Once | ORAL | Status: AC
  Administered 2017-09-13: 80 mg via ORAL
  Filled 2017-09-13: qty 4

## 2017-09-13 MED ORDER — MIDAZOLAM HCL 2 MG/2ML IJ SOLN
INTRAMUSCULAR | Status: DC | PRN
  Administered 2017-09-13 (×2): 1 mg via INTRAVENOUS

## 2017-09-13 MED ORDER — TICAGRELOR 90 MG PO TABS
90.0000 mg | ORAL_TABLET | Freq: Two times a day (BID) | ORAL | Status: DC
  Administered 2017-09-13 – 2017-09-15 (×4): 90 mg via ORAL
  Filled 2017-09-13 (×4): qty 1

## 2017-09-13 MED ORDER — ONDANSETRON HCL 4 MG/2ML IJ SOLN: 4.0000 mg | Freq: Four times a day (QID) | INTRAMUSCULAR | Status: DC | PRN

## 2017-09-13 MED ORDER — LIDOCAINE HCL (PF) 1 % IJ SOLN
INTRAMUSCULAR | Status: AC
  Filled 2017-09-13: qty 30

## 2017-09-13 MED ORDER — SODIUM CHLORIDE 0.9 % WEIGHT BASED INFUSION: 1.0000 mL/kg/h | INTRAVENOUS | Status: AC

## 2017-09-13 MED ORDER — NITROGLYCERIN 0.4 MG SL SUBL
0.4000 mg | SUBLINGUAL_TABLET | SUBLINGUAL | Status: DC | PRN
  Administered 2017-09-13 – 2017-09-14 (×7): 0.4 mg via SUBLINGUAL
  Filled 2017-09-13 (×4): qty 1

## 2017-09-13 MED ORDER — ASPIRIN 81 MG PO CHEW
81.0000 mg | CHEWABLE_TABLET | Freq: Every day | ORAL | Status: DC
  Administered 2017-09-13 – 2017-09-15 (×3): 81 mg via ORAL
  Filled 2017-09-13 (×3): qty 1

## 2017-09-13 MED ORDER — HEPARIN SODIUM (PORCINE) 5000 UNIT/ML IJ SOLN: 60.0000 [IU]/kg | Freq: Once | INTRAMUSCULAR | Status: DC

## 2017-09-13 MED ORDER — ATORVASTATIN CALCIUM 80 MG PO TABS
80.0000 mg | ORAL_TABLET | Freq: Every day | ORAL | Status: DC
  Administered 2017-09-14: 80 mg via ORAL
  Filled 2017-09-13: qty 4
  Filled 2017-09-13: qty 1
  Filled 2017-09-13: qty 2

## 2017-09-13 MED ORDER — VERAPAMIL HCL 2.5 MG/ML IV SOLN
INTRAVENOUS | Status: DC | PRN
  Administered 2017-09-13: 2.5 mg via INTRAVENOUS

## 2017-09-13 MED ORDER — VERAPAMIL HCL 2.5 MG/ML IV SOLN
INTRAVENOUS | Status: AC
  Filled 2017-09-13: qty 2

## 2017-09-13 MED ORDER — HEPARIN (PORCINE) IN NACL 2-0.9 UNIT/ML-% IJ SOLN
INTRAMUSCULAR | Status: AC
  Filled 2017-09-13: qty 500

## 2017-09-13 MED ORDER — ACETAMINOPHEN 325 MG PO TABS: 650.0000 mg | ORAL_TABLET | ORAL | Status: DC | PRN

## 2017-09-13 MED ORDER — LIDOCAINE HCL (PF) 1 % IJ SOLN
INTRAMUSCULAR | Status: DC | PRN
  Administered 2017-09-13: 3 mL

## 2017-09-13 MED ORDER — ENOXAPARIN SODIUM 40 MG/0.4ML ~~LOC~~ SOLN
40.0000 mg | SUBCUTANEOUS | Status: DC
  Administered 2017-09-14 – 2017-09-15 (×2): 40 mg via SUBCUTANEOUS
  Filled 2017-09-13 (×2): qty 0.4

## 2017-09-13 MED ORDER — NITROGLYCERIN 5 MG/ML IV SOLN
INTRAVENOUS | Status: AC
  Filled 2017-09-13: qty 10

## 2017-09-13 MED ORDER — MIDAZOLAM HCL 2 MG/2ML IJ SOLN
INTRAMUSCULAR | Status: AC
  Filled 2017-09-13: qty 2

## 2017-09-13 MED ORDER — TICAGRELOR 90 MG PO TABS
180.0000 mg | ORAL_TABLET | Freq: Once | ORAL | Status: DC
  Filled 2017-09-13 (×2): qty 2

## 2017-09-13 MED ORDER — MORPHINE SULFATE (PF) 2 MG/ML IV SOLN
2.0000 mg | INTRAVENOUS | Status: DC | PRN
  Administered 2017-09-13 – 2017-09-14 (×2): 2 mg via INTRAVENOUS
  Filled 2017-09-13 (×2): qty 1

## 2017-09-13 MED ORDER — HEPARIN SODIUM (PORCINE) 1000 UNIT/ML IJ SOLN
INTRAMUSCULAR | Status: DC | PRN
  Administered 2017-09-13: 1500 [IU] via INTRAVENOUS
  Administered 2017-09-13: 4000 [IU] via INTRAVENOUS

## 2017-09-13 MED ORDER — HEPARIN (PORCINE) IN NACL 100-0.45 UNIT/ML-% IJ SOLN: 750.0000 [IU]/h | INTRAMUSCULAR | Status: DC

## 2017-09-13 MED ORDER — SODIUM CHLORIDE 0.9% FLUSH
3.0000 mL | Freq: Two times a day (BID) | INTRAVENOUS | Status: DC
  Administered 2017-09-14 – 2017-09-15 (×3): 3 mL via INTRAVENOUS

## 2017-09-13 MED ORDER — MORPHINE SULFATE (PF) 4 MG/ML IV SOLN
4.0000 mg | Freq: Once | INTRAVENOUS | Status: AC
  Administered 2017-09-13: 4 mg via INTRAVENOUS

## 2017-09-13 MED ORDER — TICAGRELOR 90 MG PO TABS
ORAL_TABLET | ORAL | Status: DC | PRN
Start: 2017-09-13 — End: 2017-09-13
  Administered 2017-09-13: 180 mg via ORAL

## 2017-09-13 MED ORDER — METOPROLOL TARTRATE 25 MG PO TABS
25.0000 mg | ORAL_TABLET | Freq: Two times a day (BID) | ORAL | Status: DC
  Administered 2017-09-13 – 2017-09-14 (×2): 25 mg via ORAL
  Filled 2017-09-13 (×3): qty 1

## 2017-09-13 MED ORDER — HEPARIN SODIUM (PORCINE) 5000 UNIT/ML IJ SOLN
60.0000 [IU]/kg | Freq: Once | INTRAMUSCULAR | Status: AC
  Administered 2017-09-13: 3800 [IU] via INTRAVENOUS

## 2017-09-13 MED ORDER — SODIUM CHLORIDE 0.9% FLUSH: 3.0000 mL | INTRAVENOUS | Status: DC | PRN

## 2017-09-13 MED ORDER — SODIUM CHLORIDE 0.9 % IV SOLN: 250.0000 mL | INTRAVENOUS | Status: DC | PRN

## 2017-09-13 SURGICAL SUPPLY — 14 items
BALLN TREK RX 2.25X20 (BALLOONS) ×3
BALLN ~~LOC~~ EUPHORA RX 2.5X20 (BALLOONS) ×3
BALLOON TREK RX 2.25X20 (BALLOONS) ×1 IMPLANT
BALLOON ~~LOC~~ EUPHORA RX 2.5X20 (BALLOONS) ×1 IMPLANT
CATH HEARTRAIL 6F IL3.5 (CATHETERS) ×3 IMPLANT
CATH INFINITI 5FR ANG PIGTAIL (CATHETERS) ×3 IMPLANT
DEVICE INFLAT 30 PLUS (MISCELLANEOUS) ×3 IMPLANT
DEVICE RAD TR BAND REGULAR (VASCULAR PRODUCTS) ×3 IMPLANT
GLIDESHEATH SLEND SS 6F .021 (SHEATH) ×3 IMPLANT
KIT MANI 3VAL PERCEP (MISCELLANEOUS) ×3 IMPLANT
PACK CARDIAC CATH (CUSTOM PROCEDURE TRAY) ×3 IMPLANT
STENT RESOLUTE ONYX 2.5X26 (Permanent Stent) ×3 IMPLANT
WIRE ROSEN-J .035X260CM (WIRE) ×3 IMPLANT
WIRE RUNTHROUGH .014X180CM (WIRE) ×3 IMPLANT

## 2017-09-13 NOTE — H&P (Signed)
[facility] - [place] [facility]   PATIENT NAME: [name]    MR#:  [id number]  DATE OF BIRTH:  [birth year]  DATE OF ADMISSION:  09/13/2017  PRIMARY CARE PHYSICIAN: Patient, No Pcp Per   REQUESTING/REFERRING PHYSICIAN: Arida  CHIEF COMPLAINT:   Chief Complaint  Patient presents with  . Chest Pain  . Code STEMI    HISTORY OF PRESENT ILLNESS: [name]  is a 56 y.o. male with a known history of No medical issues, he follows with no doctor, no OTC meds. 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 Did not get better by ASA by EMT, but NTG helped some. In ER noted to be STEMI- Cath done and drug elluting stent placed in 1st diagonal.   PAST MEDICAL HISTORY:   Past Medical History:  Diagnosis Date  . Medical history non-contributory     PAST SURGICAL HISTORY:  Past Surgical History:  Procedure Laterality Date  . BYPASS GRAFT     in both lower limbs    SOCIAL HISTORY:  Social History   Tobacco Use  . Smoking status: Current Every Day Smoker    Packs/day: 0.25    Types: Cigarettes  Substance Use Topics  . Alcohol use: Yes    Alcohol/week: 1.2 oz    Types: 2 Cans of beer per week    FAMILY HISTORY:  Family History  Problem Relation Age of Onset  . CAD Father     DRUG ALLERGIES: No Known Allergies  REVIEW OF SYSTEMS:   CONSTITUTIONAL: No fever, fatigue or weakness.  EYES: No blurred or double vision.  EARS, NOSE, AND THROAT: No tinnitus or ear pain.  RESPIRATORY: No cough, shortness of breath, wheezing or hemoptysis.  CARDIOVASCULAR: positive for chest pain,no orthopnea, edema.  GASTROINTESTINAL: No nausea, vomiting, diarrhea or abdominal pain.  GENITOURINARY: No dysuria, hematuria.  ENDOCRINE: No polyuria, nocturia,  HEMATOLOGY: No anemia, easy bruising or bleeding SKIN: No rash or lesion. MUSCULOSKELETAL: No joint pain or arthritis.   NEUROLOGIC: No tingling, numbness, weakness.   PSYCHIATRY: No anxiety or depression.   MEDICATIONS AT HOME:  Prior to Admission medications   Not on File      PHYSICAL EXAMINATION:   VITAL SIGNS: Blood pressure 128/89, pulse 88, resp. rate (!) 25, height 5\' 7"  (1.702 m), weight 63.5 kg (140 lb), SpO2 98 %.  GENERAL:  56 y.o.-year-old patient lying in the bed with no acute distress.  EYES: Pupils equal, round, reactive to light and accommodation. No scleral icterus. Extraocular muscles intact.  HEENT: Head atraumatic, normocephalic. Oropharynx and nasopharynx clear.  NECK:  Supple, no jugular venous distention. No thyroid enlargement, no tenderness.  LUNGS: Normal breath sounds bilaterally, no wheezing, rales,rhonchi or crepitation. No use of accessory muscles of respiration.  CARDIOVASCULAR: S1, S2 normal. No murmurs, rubs, or gallops.  ABDOMEN: Soft, nontender, nondistended. Bowel sounds present. No organomegaly or mass.  EXTREMITIES: No pedal edema, cyanosis, or clubbing. Right wrist- post cath pressure band present. NEUROLOGIC: Cranial nerves II through XII are intact. Muscle strength 5/5 in all extremities. Sensation intact. Gait not checked.  PSYCHIATRIC: The patient is alert and oriented x 3.  SKIN: No obvious rash, lesion, or ulcer.   LABORATORY PANEL:   CBC Recent Labs  Lab 09/13/17 1554  WBC 8.7  HGB 14.9  HCT 43.3  PLT 205  MCV 88.4  MCH 30.5  MCHC 34.5  RDW 13.8  LYMPHSABS 2.4  MONOABS 0.9  EOSABS  0.1  BASOSABS 0.1   ------------------------------------------------------------------------------------------------------------------  Chemistries  Recent Labs  Lab 09/13/17 1554  NA 136  K 3.9  CL 102  CO2 23  GLUCOSE 141*  BUN 9  CREATININE 0.72  CALCIUM 9.0  AST 56*  ALT 31  ALKPHOS 49  BILITOT 0.6   ------------------------------------------------------------------------------------------------------------------ estimated creatinine clearance is 93.7 mL/min (by C-G formula based on SCr of  0.72 mg/dL). ------------------------------------------------------------------------------------------------------------------ No results for input(s): TSH, T4TOTAL, T3FREE, THYROIDAB in the last 72 hours.  Invalid input(s): FREET3   Coagulation profile Recent Labs  Lab 09/13/17 1554  INR 0.92   ------------------------------------------------------------------------------------------------------------------- No results for input(s): DDIMER in the last 72 hours. -------------------------------------------------------------------------------------------------------------------  Cardiac Enzymes Recent Labs  Lab 09/13/17 1554  TROPONINI 0.04*   ------------------------------------------------------------------------------------------------------------------ Invalid input(s): POCBNP  ---------------------------------------------------------------------------------------------------------------  Urinalysis    Component Value Date/Time   COLORURINE Yellow 08/23/2014 0836   APPEARANCEUR Clear 08/23/2014 0836   LABSPEC 1.021 08/23/2014 0836   PHURINE 6.0 08/23/2014 0836   GLUCOSEU Negative 08/23/2014 0836   HGBUR Negative 08/23/2014 0836   BILIRUBINUR Negative 08/23/2014 0836   KETONESUR Negative 08/23/2014 0836   PROTEINUR Negative 08/23/2014 0836   NITRITE Negative 08/23/2014 0836   LEUKOCYTESUR Negative 08/23/2014 0836     RADIOLOGY: No results found.  EKG: Orders placed or performed during the hospital encounter of 09/13/17  . ED EKG  . ED EKG  . EKG 12-Lead immediately post procedure  . EKG 12-Lead  . EKG 12-Lead immediately post procedure    IMPRESSION AND PLAN:  * STEMI   Cath done- DES in 1st diagonal.   As per card- check lipid and HBa1c    Dual anti platelets, Statin, metoprolol.    Echo.  * Hyperlipidemia   LDL 100, statin.  * Hth   Metoprolol  * Active smoking   Counseled to Quit for 4 min.   All the records are reviewed and case  discussed with ED provider. Management plans discussed with the patient, family and they are in agreement.  CODE STATUS: Full.    Code Status Orders  (From admission, onward)        Start     Ordered   09/13/17 1820  Full code  Continuous     09/13/17 1819    Code Status History    Date Active Date Inactive Code Status Order ID Comments User Context   This patient has a current code status but no historical code status.       TOTAL TIME TAKING CARE OF THIS PATIENT: 45 minutes.    Altamese DillingVaibhavkumar Indigo Barbian M.D on 09/13/2017   Between 7am to 6pm - Pager - [phone]  After 6pm go to [email or web address] - password [email or web address]  [facility]  Office  [phone]  CC: Primary care physician; Patient, No Pcp Per   Note: This dictation was prepared with Dragon dictation along with smaller phrase technology. Any transcriptional errors that result from this process are unintentional.

## 2017-09-13 NOTE — ED Notes (Addendum)
Cardiologist at bedside. Pt giving verbal consent for cardiac catheretization.

## 2017-09-13 NOTE — ED Triage Notes (Signed)
Pt arrives to ER via [facility] from home. Pt reports that at 1430 he began to have centralized CP, diaphoresis and SOB. Pt received 4 nitro SL, 324 ASA and 4mg  morphine. Pt alert and oriented X 4. Pt hx cardiac stent and cardiac bypass.

## 2017-09-13 NOTE — Consult Note (Signed)
[facility] Consultation      Name: [name] Wayne Swallows MRN: [id number] DOB: 1960/10/26    ADMISSION DATE:  09/13/2017 CONSULTATION DATE:  09/13/17   CHIEF COMPLAINT:   Chest pain, STEMI   HISTORY OF PRESENT ILLNESS   56 years old gentleman with past medical history significant for peripheral arterial disease status post aortobifem bypass in 2015, dyslipidemia, ongoing tobacco abuse, reported history of COPD who presented to the hospital with chest pain and found to have ST elevation MI. He was taken to the Cath Lab emergently and underwent PCI. Post catheterization the patient was transferred to the ICU. He is hemodynamically stable.  He endorses significant improvement in his chest pain.  He stated that prior to presentation to the hospital he had severe crushing chest pain and was unable to breathe.  These symptoms have resolved.    PAST MEDICAL HISTORY    :  Past Medical History:  Diagnosis Date  . Medical history non-contributory    Past Surgical History:  Procedure Laterality Date  . BYPASS GRAFT     in both lower limbs   Prior to Admission medications   Not on File   No Known Allergies   FAMILY HISTORY   No family history on file.    SOCIAL HISTORY    reports that he has been smoking cigarettes.  He has been smoking about 0.25 packs per day. He does not have any smokeless tobacco history on file. He reports that he drinks about 1.2 oz of alcohol per week. His drug history is not on file.  ROS Full multisystem review of system was performed and was negative except for above   VITAL SIGNS    Pulse Rate:  [73-90] 88 (11/30 1614) Resp:  [14-35] 25 (11/30 1620) BP: (122-152)/(88-97) 128/89 (11/30 1620) SpO2:  [96 %-98 %] 98 % (11/30 1614) Weight:  [140 lb (63.5 kg)] 140 lb (63.5 kg) (11/30 1555) HEMODYNAMICS:   VENTILATOR SETTINGS:   INTAKE / OUTPUT: No intake or output data in the 24 hours ending 09/13/17  1837     PHYSICAL EXAM   Physical Exam Awake, alert, oriented.  No apparent distress. Sclera anicteric. Oral mucosa moist. Chest is clear to auscultation bilaterally with no rales rhonchi wheezes. CVS S1, S2, 0. Abdomen is soft, nontender, nondistended.  Bowel sounds are positive. No lower extremity edema. The catheterization performed from the right radial approach    LABS   LABS:  CBC Recent Labs  Lab 09/13/17 1554  WBC 8.7  HGB 14.9  HCT 43.3  PLT 205   Coag's Recent Labs  Lab 09/13/17 1554  APTT <24*  INR 0.92   BMET Recent Labs  Lab 09/13/17 1554  NA 136  K 3.9  CL 102  CO2 23  BUN 9  CREATININE 0.72  GLUCOSE 141*   Electrolytes Recent Labs  Lab 09/13/17 1554  CALCIUM 9.0   Sepsis Markers No results for input(s): LATICACIDVEN, PROCALCITON, O2SATVEN in the last 168 hours. ABG No results for input(s): PHART, PCO2ART, PO2ART in the last 168 hours. Liver Enzymes Recent Labs  Lab 09/13/17 1554  AST 56*  ALT 31  ALKPHOS 49  BILITOT 0.6  ALBUMIN 3.8   Cardiac Enzymes Recent Labs  Lab 09/13/17 1554  TROPONINI 0.04*   Glucose No results for input(s): GLUCAP in the last 168 hours.   No results found for this or any previous visit (from the past 240 hour(s)).   Current Facility-Administered Medications:  Marland Kitchen.  [  START ON 09/14/2017] 0.9 %  sodium chloride infusion, 250 mL, Intravenous, PRN, Kirke CorinArida, Muhammad A, MD .  0.9% sodium chloride infusion, 1 mL/kg/hr, Intravenous, Continuous, Arida, Muhammad A, MD .  acetaminophen (TYLENOL) tablet 650 mg, 650 mg, Oral, Q4H PRN, Kirke CorinArida, Muhammad A, MD .  aspirin chewable tablet 81 mg, 81 mg, Oral, Daily, Iran OuchArida, Muhammad A, MD .  Melene Muller[START ON 09/14/2017] atorvastatin (LIPITOR) tablet 80 mg, 80 mg, Oral, q1800, Iran OuchArida, Muhammad A, MD .  Melene Muller[START ON 09/14/2017] enoxaparin (LOVENOX) injection 40 mg, 40 mg, Subcutaneous, Q24H, Arida, Muhammad A, MD .  metoprolol tartrate (LOPRESSOR) tablet 25 mg, 25 mg, Oral,  BID, Arida, Muhammad A, MD .  nitroGLYCERIN (NITROSTAT) SL tablet 0.4 mg, 0.4 mg, Sublingual, Q5 min PRN, Dionne BucySiadecki, Sebastian, MD, 0.4 mg at 09/13/17 1600 .  ondansetron (ZOFRAN) injection 4 mg, 4 mg, Intravenous, Q6H PRN, Iran OuchArida, Muhammad A, MD .  Melene Muller[START ON 09/14/2017] sodium chloride flush (NS) 0.9 % injection 3 mL, 3 mL, Intravenous, Q12H, Arida, Muhammad A, MD .  Melene Muller[START ON 09/14/2017] sodium chloride flush (NS) 0.9 % injection 3 mL, 3 mL, Intravenous, PRN, Arida, Muhammad A, MD .  ticagrelor (BRILINTA) tablet 90 mg, 90 mg, Oral, BID, Arida, Muhammad A, MD  IMAGING    No results found.     MICRO DATA: MRSA PCR  Urine  Blood Resp     ASSESSMENT/PLAN   56 years old gentleman with past medical history significant for peripheral arterial disease status post aortobifem bypass in 2015, dyslipidemia, ongoing tobacco abuse, reported history of COPD who presented to the hospital with chest pain and found to have ST elevation MI.  Problem list  STEMI status post PCI Ongoing tobacco abuse Dyslipidemia Reported history of COPD though the patient denies it Peripheral arterial disease status post aortobifem bypass in 2015   Plan: Treatment of cardiac issues per cardiology. Brilinta per cardiology. Continue aspirin Continue statins Continue metoprolol Echocardiogram  Smoking cessation advised Should follow-up in pulmonary clinic after discharge for PFTs and lung cancer screening   Cristy HiltsIrtaza Kacelyn Rowzee, M.D.  Pulmonary and Critical Care Medicine

## 2017-09-13 NOTE — Consult Note (Signed)
Cardiology Consultation:   Patient ID: Anthony Snideroy Wayne Stangelo; [id number]; [birth year]   Admit date: 09/13/2017 Date of Consult: 09/13/2017  Primary Care Provider: Patient, No Pcp Per Primary Cardiologist:  Ellis ParentsNew Kirke Corin(Arida) Primary Electrophysiologist:  n/a   Patient Profile:   [name] is a 56 y.o. male with a hx of peripheral arterial disease status post aortobifemoral bypass in 2015, COPD, hyperlipidemia and tobacco use who is being seen today for the evaluation of chest pain and abnormal EKG at the request of Dr. Marisa SeverinSiadecki.  History of Present Illness:   [name] is a 56 year old male with the above medical problems who presented with acute onset of chest pain described as severe substernal chest tightness radiating to both arms which started this afternoon around 230.  It has been continuous since then and was associated with significant shortness of breath and fatigue. The patient called EMS and EKG showed anterolateral ST elevation.  A code STEMI was activated.  I saw the patient in the emergency room.  He was already given aspirin and 4000 units of unfractionated heparin.  I gave him 180 mg of Brilinta and recommended proceeding with emergent cardiac catheterization and possible PCI. The patient has not been following up with any physician in the last few years. He did have prior aortobifemoral bypass by Dr. Gilda CreaseSchnier in 2015. He saw Dr. Gwen PoundsKowalski in 2015 for chest pain with negative stress test.   History reviewed. No pertinent past medical history.  History reviewed. No pertinent surgical history.   Home Medications:  Prior to Admission medications   Not on File    Inpatient Medications: Scheduled Meds: . [MAR Hold] ticagrelor  180 mg Oral Once   Continuous Infusions: . heparin     PRN Meds: [MAR Hold] nitroGLYCERIN  Allergies:   No Known Allergies  Social History:   Social History   Socioeconomic History  . Marital status: Single    Spouse name:  Not on file  . Number of children: Not on file  . Years of education: Not on file  . Highest education level: Not on file  Social Needs  . Financial resource strain: Not on file  . Food insecurity - worry: Not on file  . Food insecurity - inability: Not on file  . Transportation needs - medical: Not on file  . Transportation needs - non-medical: Not on file  Occupational History  . Not on file  Tobacco Use  . Smoking status: Not on file  Substance and Sexual Activity  . Alcohol use: Not on file  . Drug use: Not on file  . Sexual activity: Not on file  Other Topics Concern  . Not on file  Social History Narrative  . Not on file    Family History:   There is family history of coronary artery disease.  ROS:  Please see the history of present illness.  ROS  All other ROS reviewed and negative.     Physical Exam/Data:   Vitals:   09/13/17 1610 09/13/17 1612 09/13/17 1614 09/13/17 1620  BP: (!) 140/95 130/88 (!) 141/93 128/89  Pulse: 78 85 88   Resp: (!) 35 (!) 23 14 (!) 25  SpO2: 96% 97% 98%   Weight:      Height:       No intake or output data in the 24 hours ending 09/13/17 1808 Filed Weights   09/13/17 1555  Weight: 140 lb (63.5 kg)   Body mass index is 21.93 kg/m.  General:  Well  nourished, well developed, in no acute distress HEENT: normal Lymph: no adenopathy Neck: no JVD Endocrine:  No thryomegaly Vascular: No carotid bruits; FA pulses 2+ bilaterally .  Surgical scar from previous aortobifemoral bypass Cardiac:  normal S1, S2; RRR; no murmur  Lungs:  clear to auscultation bilaterally, no wheezing, rhonchi or rales  Abd: soft, nontender, no hepatomegaly  Ext: no edema Musculoskeletal:  No deformities, BUE and BLE strength normal and equal Skin: warm and dry  Neuro:  CNs 2-12 intact, no focal abnormalities noted Psych:  Normal affect   EKG:  The EKG was personally reviewed and demonstrates: Normal sinus rhythm with anterolateral ST elevation with  reciprocal changes in the inferior leads  Relevant CV Studies:   Laboratory Data:  Chemistry Recent Labs  Lab 09/13/17 1554  NA 136  K 3.9  CL 102  CO2 23  GLUCOSE 141*  BUN 9  CREATININE 0.72  CALCIUM 9.0  GFRNONAA >60  GFRAA >60  ANIONGAP 11    Recent Labs  Lab 09/13/17 1554  PROT 7.2  ALBUMIN 3.8  AST 56*  ALT 31  ALKPHOS 49  BILITOT 0.6   Hematology Recent Labs  Lab 09/13/17 1554  WBC 8.7  RBC 4.89  HGB 14.9  HCT 43.3  MCV 88.4  MCH 30.5  MCHC 34.5  RDW 13.8  PLT 205   Cardiac Enzymes Recent Labs  Lab 09/13/17 1554  TROPONINI 0.04*   No results for input(s): TROPIPOC in the last 168 hours.  BNPNo results for input(s): BNP, PROBNP in the last 168 hours.  DDimer No results for input(s): DDIMER in the last 168 hours.  Radiology/Studies:  No results found.  Assessment and Plan:   1. Anterolateral ST elevation myocardial infarction: Cardiac catheterization showed subtotal occlusion of large first diagonal which was the culprit with moderate proximal LAD disease and moderate RCA disease.  His vessels are moderately calcified.  EF was normal.  Recommend dual antiplatelet therapy for at least one year.  I strongly advised him to improve his lifestyle.  Recommend smoking cessation and cardiac rehab. 2. Peripheral arterial disease status post aortobifemoral bypass in 2015: No significant claudication. 3. Hyperlipidemia: I started high-dose atorvastatin. 4. Tobacco use: I strongly advised him to quit smoking.   For questions or updates, please contact [facility] Please consult [email or web address] for contact info under Cardiology/STEMI.   Signed, Lorine BearsMuhammad Arida, MD  09/13/2017 6:08 PM

## 2017-09-13 NOTE — ED Notes (Signed)
Pt transferred to cath lab with this RN and Dr. Kirke CorinArida, no acute events. Reports to Fayrene FearingJames, cath lab tech.

## 2017-09-13 NOTE — ED Provider Notes (Signed)
[facility] Emergency Department Provider Note ____________________________________________   First MD Initiated Contact with Patient 09/13/17 1553     (approximate)  I have reviewed the triage vital signs and the nursing notes.   HISTORY  Chief Complaint Chest Pain and Code STEMI    HPI [name] Wayne Keenum is a 56 y.o. male with past medical history apparently of peripheral vascular disease but no prior cardiac history who presents with chest pain, acute onset when he was moving things around in his attic at approximately 2:30 PM, described as pressure-like and severe in intensity, associated with some shortness of breath.  Patient denies any prior history of similar pain.  He called EMS.  324 of aspirin, 4 nitro, and 4 of morphine given by EMS.  History reviewed. No pertinent past medical history.  There are no active problems to display for this patient.   History reviewed. No pertinent surgical history.  Prior to Admission medications   Not on File    Allergies Patient has no known allergies.  No family history on file.  Social History Social History   Tobacco Use  . Smoking status: Not on file  Substance Use Topics  . Alcohol use: Not on file  . Drug use: Not on file    Review of Systems  Constitutional: No fever.  Eyes: No redness. ENT: No neck pain. Cardiovascular: Positive for chest pain. Respiratory: Positive for shortness of breath. Gastrointestinal: No nausea, no vomiting.   Genitourinary: Negative for flank pain.  Musculoskeletal: Negative for back pain. Skin: Negative for rash. Neurological: Negative for headache.    ____________________________________________   PHYSICAL EXAM:  VITAL SIGNS: ED Triage Vitals  Enc Vitals Group     BP 09/13/17 1553 (!) 152/97     Pulse Rate 09/13/17 1558 73     Resp 09/13/17 1553 (!) 22     Temp --      Temp src --      SpO2 09/13/17 1558 97 %     Weight 09/13/17 1555  140 lb (63.5 kg)     Height 09/13/17 1555 5\' 7"  (1.702 m)     Head Circumference --      Peak Flow --      Pain Score 09/13/17 1553 8     Pain Loc --      Pain Edu? --      Excl. in GC? --     Constitutional: Alert and oriented.  Uncomfortable appearing and in no acute distress. Eyes: Conjunctivae are normal.  Head: Atraumatic. Nose: No congestion/rhinnorhea. Mouth/Throat: Mucous membranes are moist.   Neck: Normal range of motion.  Cardiovascular: Normal rate, regular rhythm. Grossly normal heart sounds.  Good peripheral circulation. Respiratory: Normal respiratory effort.  No retractions. Lungs CTAB. Gastrointestinal: Soft and nontender. No distention.  Genitourinary: No flank tenderness. Musculoskeletal: No lower extremity edema.  Extremities warm and well perfused.  Neurologic:  Normal speech and language. No gross focal neurologic deficits are appreciated.  Skin:  Skin is warm and dry. No rash noted. Psychiatric: Mood and affect are normal. Speech and behavior are normal.  ____________________________________________   LABS (all labs ordered are listed, but only abnormal results are displayed)  Labs Reviewed  CBC WITH DIFFERENTIAL/PLATELET  PROTIME-INR  APTT  COMPREHENSIVE METABOLIC PANEL  TROPONIN I  LIPID PANEL   ____________________________________________  EKG  ED ECG REPORT I, Dionne BucySebastian Sanjith Siwek, the attending physician, personally viewed and interpreted this ECG.  Date: 09/13/2017 EKG Time: 1555 Rate: 79  Rhythm: normal sinus rhythm QRS Axis: normal Intervals: normal ST/T Wave abnormalities: ST elevation in V2 and aVL, with reciprocal change in II, III, and aVF Narrative Interpretation: Acute ST elevation MI  ____________________________________________  RADIOLOGY    ____________________________________________   PROCEDURES  Procedure(s) performed: No    Critical Care performed: Yes  CRITICAL CARE Performed by: Dionne BucySebastian  Tyrek Lawhorn   Total critical care time: 40 minutes  Critical care time was exclusive of separately billable procedures and treating other patients.  Critical care was necessary to treat or prevent imminent or life-threatening deterioration.  Critical care was time spent personally by me on the following activities: development of treatment plan with patient and/or surrogate as well as nursing, discussions with consultants, evaluation of patient's response to treatment, examination of patient, obtaining history from patient or surrogate, ordering and performing treatments and interventions, ordering and review of laboratory studies, ordering and review of radiographic studies, pulse oximetry and re-evaluation of patient's condition. ____________________________________________   INITIAL IMPRESSION / ASSESSMENT AND PLAN / ED COURSE  Pertinent labs & imaging results that were available during my care of the patient were reviewed by me and considered in my medical decision making (see chart for details).  56 year old male with past medical history of peripheral vascular disease presents with acute onset of chest pain with EKG consistent with STEMI.  Patient given aspirin and nitro by EMS.  STEMI code activated.  On arrival to the ED, vital signs are stable, patient is uncomfortable but not acutely ill-appearing, and the remainder the exam is unremarkable.  EKG here confirms STEMI.  Patient given heparin and additional nitro, as well as Brilinta.  Evaluated by Dr. Kirke CorinArida in the emergency department, who I consulted with on patient's care.  He will proceed to cath lab.           ____________________________________________   FINAL CLINICAL IMPRESSION(S) / ED DIAGNOSES  Final diagnoses:  ST elevation myocardial infarction (STEMI), unspecified artery (HCC)      NEW MEDICATIONS STARTED DURING THIS VISIT:  This SmartLink is deprecated. Use AVSMEDLIST instead to display the medication list  for a patient.   Note:  This document was prepared using Dragon voice recognition software and may include unintentional dictation errors.    Dionne BucySiadecki, Drew Lips, MD 09/13/17 [phone]

## 2017-09-13 NOTE — Progress Notes (Signed)
ANTICOAGULATION CONSULT NOTE - Initial Consult  Pharmacy Consult for heparin Indication: chest pain/ACS  No Known Allergies  Patient Measurements: Height: 5\' 7"  (170.2 cm) Weight: 140 lb (63.5 kg) IBW/kg (Calculated) : 66.1 Heparin Dosing Weight: 63.5 kg  Vital Signs: BP: 128/89 (11/30 1620) Pulse Rate: 88 (11/30 1614)  Labs: Recent Labs    09/13/17 1554  HGB 14.9  HCT 43.3  PLT 205  APTT <24*  LABPROT 12.3  INR 0.92  CREATININE 0.72  TROPONINI 0.04*    Estimated Creatinine Clearance: 93.7 mL/min (by C-G formula based on SCr of 0.72 mg/dL).   Medical History: History reviewed. No pertinent past medical history.  Medications:  Infusions:  . heparin      Assessment: 56 yom cc CP, diaphoresis, SOB. PMH cardiac stent and bypass. Pharmacy consulted to dose UFH for ACS. PTA list not finalized. Need to f/u PTA meds.   Goal of Therapy:  Heparin level 0.3-0.7 units/ml Monitor platelets by anticoagulation protocol: Yes   Plan:  Give 3800 units bolus x 1, 4000 units IV x 1, 1500 units IV x 1.  Start heparin infusion at 750 units/hr Check anti-Xa level in 6 hours and daily while on heparin Continue to monitor H&H and platelets  It looks like post cath cardiologist has ordered LMWH 40 once daily for DVT ppx. Will follow up on the fate of this heparin consult.   Carola FrostNathan A Holt Woolbright, Pharm.D., BCPS Clinical Pharmacist 09/13/2017,5:58 PM

## 2017-09-13 NOTE — ED Notes (Signed)
Pharmacy called for Brilenta.

## 2017-09-14 ENCOUNTER — Inpatient Hospital Stay (HOSPITAL_COMMUNITY)
Admit: 2017-09-14 | Discharge: 2017-09-14 | Disposition: A | Discharge status: Home / Self Care | Payer: Medicaid Other | Attending: Cardiovascular Disease | Admitting: Cardiovascular Disease

## 2017-09-14 ENCOUNTER — Other Ambulatory Visit: Payer: Self-pay

## 2017-09-14 DIAGNOSIS — I739 Peripheral vascular disease, unspecified: Secondary | ICD-10-CM

## 2017-09-14 DIAGNOSIS — I5030 Unspecified diastolic (congestive) heart failure: Secondary | ICD-10-CM

## 2017-09-14 DIAGNOSIS — I2109 ST elevation (STEMI) myocardial infarction involving other coronary artery of anterior wall: Secondary | ICD-10-CM

## 2017-09-14 LAB — ECHOCARDIOGRAM COMPLETE
Area-P 1/2: 5.37 cm2
E decel time: 141 ms
E/e' ratio: 7.16
FS: 39 % (ref 28–44)
Height: 67 in
IVS/LV PW RATIO, ED: 0.98
LA ID, A-P, ES: 31 mm
LA diam end sys: 31 mm
LA diam index: 1.79 cm/m2
LA vol A4C: 23.3 mL
LA vol index: 14.3 mL/m2
LA vol: 24.8 mL
LV E/e' medial: 7.16
LV E/e'average: 7.16
LV PW d: 8.87 mm — AB (ref 0.6–1.1)
LV e' LATERAL: 10.3 cm/s
Lateral S' vel: 11.7 cm/s
MV Dec: 141
MV Peak grad: 2 mmHg
MV pk A vel: 61.1 m/s
MV pk E vel: 73.7 m/s
P 1/2 time: 41 ms
TAPSE: 24.3 mm
TDI e' lateral: 10.3
TDI e' medial: 8.27
Weight: 2240 [oz_av]

## 2017-09-14 LAB — BASIC METABOLIC PANEL
ANION GAP: 8 (ref 5–15)
BUN: 7 mg/dL (ref 6–20)
CHLORIDE: 107 mmol/L (ref 101–111)
CO2: 21 mmol/L — ABNORMAL LOW (ref 22–32)
Calcium: 8.6 mg/dL — ABNORMAL LOW (ref 8.9–10.3)
Creatinine, Ser: 0.52 mg/dL — ABNORMAL LOW (ref 0.61–1.24)
GFR calc Af Amer: >60 mL/min (ref >60)
Glucose, Bld: 96 mg/dL (ref 65–99)
POTASSIUM: 3.6 mmol/L (ref 3.5–5.1)
Sodium: 136 mmol/L (ref 135–145)

## 2017-09-14 LAB — TROPONIN I
Troponin I: 31.47 ng/mL
Troponin I: 23.96 ng/mL

## 2017-09-14 LAB — CBC
HCT: 39.4 % — ABNORMAL LOW (ref 40.0–52.0)
Hemoglobin: 13.4 g/dL (ref 13.0–18.0)
MCH: 30.4 pg (ref 26.0–34.0)
MCHC: 34 g/dL (ref 32.0–36.0)
MCV: 89.4 fL (ref 80.0–100.0)
Platelets: 187 10*3/uL (ref 150–440)
RBC: 4.41 MIL/uL (ref 4.40–5.90)
RDW: 14.2 % (ref 11.5–14.5)
WBC: 7.2 10*3/uL (ref 3.8–10.6)

## 2017-09-14 LAB — LIPID PANEL
Cholesterol: 171 mg/dL (ref 0–200)
HDL: 68 mg/dL
LDL Cholesterol: 85 mg/dL (ref 0–99)
Total CHOL/HDL Ratio: 2.5 ratio
Triglycerides: 91 mg/dL
VLDL: 18 mg/dL (ref 0–40)

## 2017-09-14 LAB — HEMOGLOBIN A1C
Hgb A1c MFr Bld: 5.5 % (ref 4.8–5.6)
Mean Plasma Glucose: 111.15 mg/dL

## 2017-09-14 MED ORDER — MAGNESIUM SULFATE 2 GM/50ML IV SOLN
2.0000 g | Freq: Once | INTRAVENOUS | Status: AC
  Administered 2017-09-14: 2 g via INTRAVENOUS
  Filled 2017-09-14: qty 50

## 2017-09-14 MED ORDER — LISINOPRIL 5 MG PO TABS
5.0000 mg | ORAL_TABLET | Freq: Every day | ORAL | Status: DC
  Administered 2017-09-14: 5 mg via ORAL
  Filled 2017-09-14 (×2): qty 1

## 2017-09-14 NOTE — Progress Notes (Signed)
[employer] -  at [facility]   PATIENT NAME: [name]    MR#:  [id number]  DATE OF BIRTH:  1961-01-08  SUBJECTIVE:  CHIEF COMPLAINT:   Chief Complaint  Patient presents with  . Chest Pain  . Code STEMI   came with STEMI- 1st diagonal stent is placed, stable now.  REVIEW OF SYSTEMS:  CONSTITUTIONAL: No fever, fatigue or weakness.  EYES: No blurred or double vision.  EARS, NOSE, AND THROAT: No tinnitus or ear pain.  RESPIRATORY: No cough, shortness of breath, wheezing or hemoptysis.  CARDIOVASCULAR: No chest pain, orthopnea, edema.  GASTROINTESTINAL: No nausea, vomiting, diarrhea or abdominal pain.  GENITOURINARY: No dysuria, hematuria.  ENDOCRINE: No polyuria, nocturia,  HEMATOLOGY: No anemia, easy bruising or bleeding SKIN: No rash or lesion. MUSCULOSKELETAL: No joint pain or arthritis.   NEUROLOGIC: No tingling, numbness, weakness.  PSYCHIATRY: No anxiety or depression.   ROS  DRUG ALLERGIES:  No Known Allergies  VITALS:  Blood pressure 111/75, pulse 78, temperature 98.2 F (36.8 C), temperature source Oral, resp. rate 18, height 5\' 7"  (1.702 m), weight 63.5 kg (140 lb), SpO2 98 %.  PHYSICAL EXAMINATION:  GENERAL:  56 y.o.-year-old patient lying in the bed with no acute distress.  EYES: Pupils equal, round, reactive to light and accommodation. No scleral icterus. Extraocular muscles intact.  HEENT: Head atraumatic, normocephalic. Oropharynx and nasopharynx clear.  NECK:  Supple, no jugular venous distention. No thyroid enlargement, no tenderness.  LUNGS: Normal breath sounds bilaterally, no wheezing, rales,rhonchi or crepitation. No use of accessory muscles of respiration.  CARDIOVASCULAR: S1, S2 normal. No murmurs, rubs, or gallops.  ABDOMEN: Soft, nontender, nondistended. Bowel sounds present. No organomegaly or mass.  EXTREMITIES: No pedal edema, cyanosis, or clubbing.  NEUROLOGIC: Cranial nerves II through XII are intact. Muscle  strength 5/5 in all extremities. Sensation intact. Gait not checked.  PSYCHIATRIC: The patient is alert and oriented x 3.  SKIN: No obvious rash, lesion, or ulcer.   Physical Exam LABORATORY PANEL:   CBC Recent Labs  Lab 09/14/17 0514  WBC 7.2  HGB 13.4  HCT 39.4*  PLT 187   ------------------------------------------------------------------------------------------------------------------  Chemistries  Recent Labs  Lab 09/13/17 1554 09/13/17 1925 09/14/17 0514  NA 136 134* 136  K 3.9 3.5 3.6  CL 102 102 107  CO2 23 26 21*  GLUCOSE 141* 136* 96  BUN 9 9 7   CREATININE 0.72 0.68 0.52*  CALCIUM 9.0 8.7* 8.6*  MG  --  1.7  --   AST 56*  --   --   ALT 31  --   --   ALKPHOS 49  --   --   BILITOT 0.6  --   --    ------------------------------------------------------------------------------------------------------------------  Cardiac Enzymes Recent Labs  Lab 09/13/17 2324 09/14/17 0514  TROPONINI 31.47* 23.96*   ------------------------------------------------------------------------------------------------------------------  RADIOLOGY:  No results found.  ASSESSMENT AND PLAN:   Active Problems:   ST elevation myocardial infarction (STEMI) (HCC)   PAD (peripheral artery disease) (HCC)   Acute ST elevation myocardial infarction (STEMI) of anterolateral wall (HCC)   * STEMI   Cath done- DES in 1st diagonal.   As per card- check lipid and HBa1c    Dual anti platelets, Statin, metoprolol.    Echo.   Monitor on tele today.  * Hyperlipidemia   LDL 100, statin.  * Hth   Metoprolol  * PAD- bypass- stable.  * Drug use    Counseled to stop.  * Active  smoking   Counseled to Quit for 4 min.    All the records are reviewed and case discussed with Care Management/Social Workerr. Management plans discussed with the patient, family and they are in agreement.  CODE STATUS: Full.  TOTAL TIME TAKING CARE OF THIS PATIENT: 35 minutes.     POSSIBLE D/C  IN 1-2 DAYS, DEPENDING ON CLINICAL CONDITION.   Altamese DillingVaibhavkumar Jasnoor Trussell M.D on 09/14/2017   Between 7am to 6pm - Pager - [phone]  After 6pm go to [email or web address] - password [email or web address]  [facility]  Office  [phone]  CC: Primary care physician; Patient, No Pcp Per  Note: This dictation was prepared with Dragon dictation along with smaller phrase technology. Any transcriptional errors that result from this process are unintentional.

## 2017-09-14 NOTE — Progress Notes (Addendum)
Pt. HR dropped to 27, upon assessing pt,  HR was back up to 70,  he was asleep and woke to voice, no c/o pain, SOB or acute distress. Pt is alert and oriented. When I explained to pt. What had happened he said his HR dropped on 11/30 to 12 and 13. Will continue to monitor pt. MD aware, no new orders at this time.

## 2017-09-14 NOTE — Progress Notes (Signed)
CRITICAL VALUE ALERT  Critical Value:  Troponin I 31.47  Date & Time Notied:  09/13/17 @ 2330  Provider Notified: Luci Bankukov, NP  Orders Received/Actions taken: Acknowledged, no new orders received at this time.  Also notified her of UDS results for positive benzodiazepine, cocaine, and opiate.

## 2017-09-14 NOTE — Progress Notes (Signed)
[facility] Pulmonary Medicine Consultation     Date: 09/14/2017,   MRN# [id number] [name] 02-Mar-1961 Code Status:     Code Status Orders  (From admission, onward)        Start     Ordered   09/13/17 1820  Full code  Continuous     09/13/17 1819    Code Status History    Date Active Date Inactive Code Status Order ID Comments User Context   This patient has a current code status but no historical code status.     Hosp day:@LENGTHOFSTAYDAYS @ Referring MD: @ATDPROV @     PCP:      AdmissionWeight: 140 lb (63.5 kg)                 CurrentWeight: 140 lb (63.5 kg) [name] is a 56 y.o. old male      CHIEF COMPLAINT:   STEMI status post PCI   SUBJECTIVE:   No acute complaints.  Endorses very mild chest soreness, 1-2 on the scale.  States this is much better compared to before. Denies shortness of breath.  No acute complaints.  MEDICATIONS    Home Medication:    Current Medication:   Current Facility-Administered Medications:  .  0.9 %  sodium chloride infusion, 250 mL, Intravenous, PRN, Kirke CorinArida, Muhammad A, MD .  acetaminophen (TYLENOL) tablet 650 mg, 650 mg, Oral, Q4H PRN, Arida, Muhammad A, MD .  aspirin chewable tablet 81 mg, 81 mg, Oral, Daily, Lorine BearsArida, Muhammad A, MD, 81 mg at 09/14/17 1038 .  atorvastatin (LIPITOR) tablet 80 mg, 80 mg, Oral, q1800, Arida, Muhammad A, MD .  enoxaparin (LOVENOX) injection 40 mg, 40 mg, Subcutaneous, Q24H, Arida, Muhammad A, MD, 40 mg at 09/14/17 1038 .  lisinopril (PRINIVIL,ZESTRIL) tablet 5 mg, 5 mg, Oral, Daily, Laurey MoraleMcLean, Dalton S, MD, 5 mg at 09/14/17 1250 .  metoprolol tartrate (LOPRESSOR) tablet 25 mg, 25 mg, Oral, BID, Arida, Muhammad A, MD, 25 mg at 09/14/17 1038 .  morphine 2 MG/ML injection 2-4 mg, 2-4 mg, Intravenous, Q3H PRN, Luci Bankukov, Magadalene S, NP, 2 mg at 09/14/17 0234 .  nitroGLYCERIN (NITROSTAT) SL tablet 0.4 mg, 0.4 mg, Sublingual, Q5 min PRN, Dionne BucySiadecki, Sebastian, MD, 0.4 mg at  09/14/17 0220 .  ondansetron (ZOFRAN) injection 4 mg, 4 mg, Intravenous, Q6H PRN, Arida, Muhammad A, MD .  sodium chloride flush (NS) 0.9 % injection 3 mL, 3 mL, Intravenous, Q12H, Arida, Muhammad A, MD, 3 mL at 09/14/17 1040 .  sodium chloride flush (NS) 0.9 % injection 3 mL, 3 mL, Intravenous, PRN, Kirke CorinArida, Muhammad A, MD .  ticagrelor (BRILINTA) tablet 90 mg, 90 mg, Oral, BID, Kirke CorinArida, Muhammad A, MD, 90 mg at 09/14/17 1038     REVIEW OF SYSTEMS     VS: BP 111/75   Pulse 78   Temp 98.2 F (36.8 C) (Oral)   Resp 18   Ht 5\' 7"  (1.702 m)   Wt 140 lb (63.5 kg)   SpO2 98%   BMI 21.93 kg/m      PHYSICAL EXAM   Physical Exam  Awake, alert, oriented.  No apparent distress. Sclera anicteric. Oral mucosa moist. Chest is clear to auscultation bilaterally with no rales rhonchi wheezes. CVS S1, S2, 0. Abdomen is soft, nontender, nondistended.  Bowel sounds are positive. No lower extremity edema.     LABS    Recent Labs    09/13/17 1554 09/13/17 1925 09/14/17 0514  HGB 14.9  --  13.4  HCT  43.3  --  39.4*  MCV 88.4  --  89.4  WBC 8.7  --  7.2  BUN 9 9 7   CREATININE 0.72 0.68 0.52*  GLUCOSE 141* 136* 96  CALCIUM 9.0 8.7* 8.6*  INR 0.92  --   --   ,    No results for input(s): PH in the last 72 hours.  Invalid input(s): PCO2, PO2, BASEEXCESS, BASEDEFICITE, TFT    CULTURE RESULTS   Recent Results (from the past 240 hour(s))  MRSA PCR Screening     Status: None   Collection Time: 09/13/17  6:10 PM  Result Value Ref Range Status   MRSA by PCR NEGATIVE NEGATIVE Final    Comment:        The GeneXpert MRSA Assay (FDA approved for NASAL specimens only), is one component of a comprehensive MRSA colonization surveillance program. It is not intended to diagnose MRSA infection nor to guide or monitor treatment for MRSA infections.           IMAGING    No results found.       ASSESSMENT/PLAN    56 years old gentleman with past medical history  significant for peripheral arterial disease status post aortobifem bypass in 2015, dyslipidemia, ongoing tobacco abuse, reported history of COPD who presented to the hospital with chest pain and found to have ST elevation MI.  Problem list  Antero-lateral STEMI status post PCI Ongoing tobacco abuse Dyslipidemia [place] positive for benzos, cocaine and opiates. Patient denies cocaine use.  Reported history of COPD though the patient denies it Peripheral arterial disease status post aortobifem bypass in 2015   Plan: Treatment of cardiac issues per cardiology. Brilinta per cardiology. Continue aspirin Continue statins Continue metoprolol ACEI per cards. Echocardiogram awaited  Smoking cessation repeatedly advised. Patient denies using cocaine or any other drugs of recreation/abuse.  Benzos and opiates positive could be from the medications he has received in the hospital but I cannot explain why he is testing positive for cocaine when he himself is denying its use. Regardless, I advised him to refrain from any drugs of abuse/recreation.  Should follow-up in pulmonary clinic after discharge for PFTs and lung cancer screening.  Stable to be transferred to telemetry today.    Cristy HiltsIrtaza Miniya Miguez, M.D.  Pulmonary & Critical Care Medicine

## 2017-09-14 NOTE — Progress Notes (Signed)
Patient ID: [name] Deblois, male   DOB: Nov 03, 1960, 56 y.o.   MRN: [id number]   Progress Note  Patient Name: [name] Emanuele Date of Encounter: 09/14/2017  Primary Cardiologist: Kirke CorinArida  Subjective   Very minimal chest soreness.  Overall feels fine.  Has not been out of bed.   LHC (11/30): 60% pLAD, 99% D1, 50% mRCA => DES to culprit D1.    Inpatient Medications    Scheduled Meds: . aspirin  81 mg Oral Daily  . atorvastatin  80 mg Oral q1800  . enoxaparin (LOVENOX) injection  40 mg Subcutaneous Q24H  . lisinopril  5 mg Oral Daily  . metoprolol tartrate  25 mg Oral BID  . sodium chloride flush  3 mL Intravenous Q12H  . ticagrelor  90 mg Oral BID   Continuous Infusions: . sodium chloride     PRN Meds: sodium chloride, acetaminophen, morphine injection, nitroGLYCERIN, ondansetron (ZOFRAN) IV, sodium chloride flush   Vital Signs    Vitals:   09/14/17 0700 09/14/17 0800 09/14/17 0900 09/14/17 1000  BP: 108/84 113/75 108/75 106/77  Pulse: 68 (!) 55 67 68  Resp: 13 20 13 15   Temp:  97.6 F (36.4 C)    TempSrc:  Oral    SpO2: 95% 97% 94% 95%  Weight:      Height:        Intake/Output Summary (Last 24 hours) at 09/14/2017 1119 Last data filed at 09/14/2017 1000 Gross per 24 hour  Intake 508 ml  Output 1675 ml  Net -1167 ml   Filed Weights   09/13/17 1555  Weight: 140 lb (63.5 kg)    Telemetry    NSR - Personally Reviewed   Physical Exam   General: NAD Neck: No JVD, no thyromegaly or thyroid nodule.  Lungs: Occasional rhonchi. CV: Nondisplaced PMI.  Heart regular S1/S2, no S3/S4, no murmur.  No peripheral edema.  No carotid bruit.  Normal pedal pulses.  Abdomen: Soft, nontender, no hepatosplenomegaly, no distention.  Skin: Intact without lesions or rashes.  Neurologic: Alert and oriented x 3.  Psych: Normal affect. Extremities: No clubbing or cyanosis. Right radial cath site benign.   HEENT: Normal.    Labs    Chemistry Recent Labs  Lab  09/13/17 1554 09/13/17 1925 09/14/17 0514  NA 136 134* 136  K 3.9 3.5 3.6  CL 102 102 107  CO2 23 26 21*  GLUCOSE 141* 136* 96  BUN 9 9 7   CREATININE 0.72 0.68 0.52*  CALCIUM 9.0 8.7* 8.6*  PROT 7.2  --   --   ALBUMIN 3.8  --   --   AST 56*  --   --   ALT 31  --   --   ALKPHOS 49  --   --   BILITOT 0.6  --   --   GFRNONAA >60 >60 >60  GFRAA >60 >60 >60  ANIONGAP 11 6 8      Hematology Recent Labs  Lab 09/13/17 1554 09/14/17 0514  WBC 8.7 7.2  RBC 4.89 4.41  HGB 14.9 13.4  HCT 43.3 39.4*  MCV 88.4 89.4  MCH 30.5 30.4  MCHC 34.5 34.0  RDW 13.8 14.2  PLT 205 187    Cardiac Enzymes Recent Labs  Lab 09/13/17 1554 09/13/17 1925 09/13/17 2324 09/14/17 0514  TROPONINI 0.04* 14.73* 31.47* 23.96*   No results for input(s): TROPIPOC in the last 168 hours.   BNPNo results for input(s): BNP, PROBNP in the last 168 hours.  DDimer No results for input(s): DDIMER in the last 168 hours.   Radiology    No results found.   Patient Profile     56 y.o. male with PAD s/p aortobifemoral bypass, COPD/active smoker presented with anterolateral STEMI.   Assessment & Plan    1. CAD: Anterolateral STEMI, s/p DES to D1 on 11/30. Stable today.  - Continue ASA 81 + ticagrelor 90 bid.  - Continue high dose statin.  - Continue metoprolol 25 mg bid.   - Add ACEI, lisinopril 5 mg daily.  - EF preserved on LV-gram, echo has been ordered.  2. Smoking/COPD: I strongly encouraged him to stop smoking.  3. PAD: s/p aortobifemoral bypass, denies claudication.   If he remains stable, may go home tomorrow.  Can go to telemetry today.   For questions or updates, please contact [facility] Please consult [email or web address] for contact info under Cardiology/STEMI.      Signed, Marca Anconaalton Woodie Trusty, MD  09/14/2017, 11:19 AM

## 2017-09-14 NOTE — Progress Notes (Signed)
Paged Dr. Anne HahnWillis for BP running 98/65, with HR 64, new order to hold HS lopressor.

## 2017-09-15 LAB — CBC
HCT: 39.8 % — ABNORMAL LOW (ref 40.0–52.0)
Hemoglobin: 13.7 g/dL (ref 13.0–18.0)
MCH: 30.3 pg (ref 26.0–34.0)
MCHC: 34.3 g/dL (ref 32.0–36.0)
MCV: 88.2 fL (ref 80.0–100.0)
PLATELETS: 206 10*3/uL (ref 150–440)
RBC: 4.52 MIL/uL (ref 4.40–5.90)
RDW: 13.7 % (ref 11.5–14.5)
WBC: 8.1 10*3/uL (ref 3.8–10.6)

## 2017-09-15 LAB — BASIC METABOLIC PANEL
Anion gap: 7 (ref 5–15)
BUN: 6 mg/dL (ref 6–20)
CHLORIDE: 107 mmol/L (ref 101–111)
CO2: 22 mmol/L (ref 22–32)
CREATININE: 0.69 mg/dL (ref 0.61–1.24)
Calcium: 8.8 mg/dL — ABNORMAL LOW (ref 8.9–10.3)
Glucose, Bld: 93 mg/dL (ref 65–99)
Potassium: 3.5 mmol/L (ref 3.5–5.1)
SODIUM: 136 mmol/L (ref 135–145)

## 2017-09-15 MED ORDER — NITROGLYCERIN 0.4 MG SL SUBL: 0.4000 mg | SUBLINGUAL_TABLET | SUBLINGUAL | 0 refills | Status: DC | PRN

## 2017-09-15 MED ORDER — LISINOPRIL 5 MG PO TABS: 2.5000 mg | ORAL_TABLET | Freq: Every day | ORAL | Status: DC

## 2017-09-15 MED ORDER — TICAGRELOR 90 MG PO TABS: 90.0000 mg | ORAL_TABLET | Freq: Two times a day (BID) | ORAL | 0 refills | Status: DC

## 2017-09-15 MED ORDER — METOPROLOL TARTRATE 25 MG PO TABS: 12.5000 mg | ORAL_TABLET | Freq: Two times a day (BID) | ORAL | Status: DC

## 2017-09-15 MED ORDER — ATORVASTATIN CALCIUM 80 MG PO TABS: 80.0000 mg | ORAL_TABLET | Freq: Every day | ORAL | 0 refills | Status: DC

## 2017-09-15 MED ORDER — METOPROLOL TARTRATE 25 MG PO TABS: 12.5000 mg | ORAL_TABLET | Freq: Two times a day (BID) | ORAL | 0 refills | Status: DC

## 2017-09-15 MED ORDER — ASPIRIN 81 MG PO CHEW: 81.0000 mg | CHEWABLE_TABLET | Freq: Every day | ORAL | 0 refills | Status: AC

## 2017-09-15 NOTE — Progress Notes (Signed)
Pt. HR dropped to 36 unsustained,per CCMD he also had a junctional rhythm, also unsustained. Pt. Is alert and oriented with no c/o pain, SOB or acute distress observed. MD aware no new orders. Will continue to monitor pt.

## 2017-09-15 NOTE — Plan of Care (Signed)
Pt verifies understanding of medication regimen, and need to follow up with cardiologist at this time.

## 2017-09-15 NOTE — Progress Notes (Addendum)
Pt. Is very anxious to leave this morning, he reports he has a funeral to attend tomorrow, BP 99/71  soft this am, HR 73, however pt is fully dressed and pacing room. MD notified of v/s and pt activity/anxiety and concerns will await further interventions at this time.

## 2017-09-15 NOTE — Discharge Summary (Signed)
[facility] [place] [facility]   PATIENT NAME: [name]    MR#:  [id number]  DATE OF BIRTH:  24-Jul-1961  DATE OF ADMISSION:  09/13/2017 ADMITTING PHYSICIAN: Iran Ouch, MD  DATE OF DISCHARGE: 09/15/2017   PRIMARY CARE PHYSICIAN: Iran Ouch, MD    ADMISSION DIAGNOSIS:  ST elevation myocardial infarction (STEMI), unspecified artery (HCC) [I21.3] Acute ST elevation myocardial infarction (STEMI) of anterolateral wall (HCC) [I21.09]  DISCHARGE DIAGNOSIS:  Active Problems:   ST elevation myocardial infarction (STEMI) (HCC)   PAD (peripheral artery disease) (HCC)   Acute ST elevation myocardial infarction (STEMI) of anterolateral wall (HCC)   SECONDARY DIAGNOSIS:   Past Medical History:  Diagnosis Date  . Medical history non-contributory     HOSPITAL COURSE:   * STEMI Cath done- DES in 1st diagonal. As per card- check lipid and HBa1c Dual anti platelets, Statin, metoprolol. Echo reviewed EF 55%   Monitor on tele today.   Refer to cardio pulm rehab clinic and to cardiology clinic.   Case manager provided with coupons for meds.  * Hyperlipidemia LDL 100, statin.  * Hth Metoprolol, had lower normal BP- so could not give lisinopril for now, may add from clinic - if needed.  * PAD- bypass- stable.  * Drug use    Counseled to stop.  * Active smoking Counseled to Quit for 4 min.   MI / NSTEMI Discharge Checklist  Aspirin prescribed at discharge:   Yes   High Intensity Statin Prescribed? (Lipitor 40-80mg  or Crestor 20-40mg ):Yes   Beta Blocker Prescribed: Yes   ADP Receptor Inhibitor Prescribed? (i.e. Plavix etc.- Includes Medically Managed Patients): Yes   Was EF assessed during THIS hospitalization? Yes  (YES = Measured in current episode of care or document plan to evaluate after discharge.)  Was EF < 40%?   no For EF < 40%, was ACE/ARB prescribed?  For EF < 40%, was Aldosterone  Inhibitor prescribed?   (If contraindicated, provide explanation.)    Was Cardiac Rehab Phase II ordered prior to discharge? (Includes Medically Managed Patients): Yes    DISCHARGE CONDITIONS:   Stable.  CONSULTS OBTAINED:    DRUG ALLERGIES:  No Known Allergies  DISCHARGE MEDICATIONS:   Allergies as of 09/15/2017   No Known Allergies     Medication List    TAKE these medications   aspirin 81 MG chewable tablet Chew 1 tablet (81 mg total) by mouth daily. Start taking on:  09/16/2017   atorvastatin 80 MG tablet Commonly known as:  LIPITOR Take 1 tablet (80 mg total) by mouth daily at 6 PM.   metoprolol tartrate 25 MG tablet Commonly known as:  LOPRESSOR Take 0.5 tablets (12.5 mg total) by mouth 2 (two) times daily.   nitroGLYCERIN 0.4 MG SL tablet Commonly known as:  NITROSTAT Place 1 tablet (0.4 mg total) under the tongue every 5 (five) minutes as needed for chest pain.   ticagrelor 90 MG Tabs tablet Commonly known as:  BRILINTA Take 1 tablet (90 mg total) by mouth 2 (two) times daily.        DISCHARGE INSTRUCTIONS:    Follow with cardiology clinic in 1-2 weeks.  If you experience worsening of your admission symptoms, develop shortness of breath, life threatening emergency, suicidal or homicidal thoughts you must seek medical attention immediately by calling 911 or calling your MD immediately  if symptoms less severe.  You Must read complete instructions/literature along with all the possible adverse reactions/side effects  for all the Medicines you take and that have been prescribed to you. Take any new Medicines after you have completely understood and accept all the possible adverse reactions/side effects.   Please note  You were cared for by a hospitalist during your hospital stay. If you have any questions about your discharge medications or the care you received while you were in the hospital after you are discharged, you can call the unit and asked to  speak with the hospitalist on call if the hospitalist that took care of you is not available. Once you are discharged, your primary care physician will handle any further medical issues. Please note that NO REFILLS for any discharge medications will be authorized once you are discharged, as it is imperative that you return to your primary care physician (or establish a relationship with a primary care physician if you do not have one) for your aftercare needs so that they can reassess your need for medications and monitor your lab values.    Today   CHIEF COMPLAINT:   Chief Complaint  Patient presents with  . Chest Pain  . Code STEMI    HISTORY OF PRESENT ILLNESS:  [name]  is a 56 y.o. male with a known history of No medical issues, he follows with no doctor, no OTC meds. 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 Did not get better by ASA by EMT, but NTG helped some. In ER noted to be STEMI- Cath done and drug elluting stent placed in 1st diagonal.    VITAL SIGNS:  Blood pressure 99/71, pulse 73, temperature 97.9 F (36.6 C), temperature source Oral, resp. rate 18, height 5\' 7"  (1.702 m), weight 63.5 kg (140 lb), SpO2 98 %.  I/O:    Intake/Output Summary (Last 24 hours) at 09/15/2017 1141 Last data filed at 09/15/2017 0955 Gross per 24 hour  Intake 3 ml  Output 600 ml  Net -597 ml    PHYSICAL EXAMINATION:  GENERAL:  56 y.o.-year-old patient lying in the bed with no acute distress.  EYES: Pupils equal, round, reactive to light and accommodation. No scleral icterus. Extraocular muscles intact.  HEENT: Head atraumatic, normocephalic. Oropharynx and nasopharynx clear.  NECK:  Supple, no jugular venous distention. No thyroid enlargement, no tenderness.  LUNGS: Normal breath sounds bilaterally, no wheezing, rales,rhonchi or crepitation. No use of accessory muscles of respiration.  CARDIOVASCULAR: S1, S2 normal. No murmurs, rubs, or  gallops.  ABDOMEN: Soft, non-tender, non-distended. Bowel sounds present. No organomegaly or mass.  EXTREMITIES: No pedal edema, cyanosis, or clubbing.  NEUROLOGIC: Cranial nerves II through XII are intact. Muscle strength 5/5 in all extremities. Sensation intact. Gait not checked.  PSYCHIATRIC: The patient is alert and oriented x 3.  SKIN: No obvious rash, lesion, or ulcer.   DATA REVIEW:   CBC Recent Labs  Lab 09/15/17 0419  WBC 8.1  HGB 13.7  HCT 39.8*  PLT 206    Chemistries  Recent Labs  Lab 09/13/17 1554 09/13/17 1925  09/15/17 0419  NA 136 134*   < > 136  K 3.9 3.5   < > 3.5  CL 102 102   < > 107  CO2 23 26   < > 22  GLUCOSE 141* 136*   < > 93  BUN 9 9   < > 6  CREATININE 0.72 0.68   < > 0.69  CALCIUM 9.0 8.7*   < > 8.8*  MG  --  1.7  --   --   AST 56*  --   --   --   ALT 31  --   --   --   ALKPHOS 49  --   --   --   BILITOT 0.6  --   --   --    < > = values in this interval not displayed.    Cardiac Enzymes Recent Labs  Lab 09/14/17 0514  TROPONINI 23.96*    Microbiology Results  Results for orders placed or performed during the hospital encounter of 09/13/17  MRSA PCR Screening     Status: None   Collection Time: 09/13/17  6:10 PM  Result Value Ref Range Status   MRSA by PCR NEGATIVE NEGATIVE Final    Comment:        The GeneXpert MRSA Assay (FDA approved for NASAL specimens only), is one component of a comprehensive MRSA colonization surveillance program. It is not intended to diagnose MRSA infection nor to guide or monitor treatment for MRSA infections.     RADIOLOGY:  No results found.  EKG:   Orders placed or performed during the hospital encounter of 09/13/17  . ED EKG  . ED EKG  . EKG 12-Lead immediately post procedure  . EKG 12-Lead  . EKG 12-Lead immediately post procedure  . EKG 12-Lead      Management plans discussed with the patient, family and they are in agreement.  CODE STATUS:     Code Status Orders   (From admission, onward)        Start     Ordered   09/13/17 1820  Full code  Continuous     09/13/17 1819    Code Status History    Date Active Date Inactive Code Status Order ID Comments User Context   This patient has a current code status but no historical code status.      TOTAL TIME TAKING CARE OF THIS PATIENT: 35 minutes.    Altamese DillingVaibhavkumar Myson Levi M.D on 09/15/2017 at 11:41 AM  Between 7am to 6pm - Pager - [phone]  After 6pm go to [email or web address] - password [email or web address]  [facility]  Office  [phone]  CC: Primary care physician; Iran OuchArida, Muhammad A, MD   Note: This dictation was prepared with Dragon dictation along with smaller phrase technology. Any transcriptional errors that result from this process are unintentional.

## 2017-09-15 NOTE — Care Management Note (Signed)
Case Management Note  Patient Details  Name: [name] Wayne Mcclarty MRN: [id number] Date of Birth: October 12, 1961  Subjective/Objective:      Uninsured Mr Thamas JaegersWinchester was provided with a MATCH coupon and instructed about how and where to use it. A Brilinta coupon was provided with a note and verbal instruction to contact Dr Jari SportsmanArida's office on Monday if pharmacy would not honor the [employer] coupon. No home health services ordered.             Action/Plan:   Expected Discharge Date:  09/15/17               Expected Discharge Plan:     In-House Referral:     Discharge planning Services     Post Acute Care Choice:    Choice offered to:     DME Arranged:    DME Agency:     HH Arranged:    HH Agency:     Status of Service:     If discussed at [employer] Length of [employer] Meetings, dates discussed:    Additional Comments:  Emonte Dieujuste A, RN 09/15/2017, 12:44 PM

## 2017-09-15 NOTE — Progress Notes (Signed)
Patient ID: [name], male   DOB: 11/01/1960, 56 y.o.   MRN: [id number]   Progress Note  Patient Name: [name] Date of Encounter: 09/15/2017  Primary Cardiologist: Kirke CorinArida  Subjective   No chest pain, no complaints.   Echo: EF 55-60%, mid anterolateral severe hypokinesis.   LHC (11/30): 60% pLAD, 99% D1, 50% mRCA => DES to culprit D1.    Inpatient Medications    Scheduled Meds: . aspirin  81 mg Oral Daily  . atorvastatin  80 mg Oral q1800  . enoxaparin (LOVENOX) injection  40 mg Subcutaneous Q24H  . metoprolol tartrate  12.5 mg Oral BID  . sodium chloride flush  3 mL Intravenous Q12H  . ticagrelor  90 mg Oral BID   Continuous Infusions: . sodium chloride     PRN Meds: sodium chloride, acetaminophen, morphine injection, nitroGLYCERIN, ondansetron (ZOFRAN) IV, sodium chloride flush   Vital Signs    Vitals:   09/14/17 1947 09/15/17 0449 09/15/17 0952 09/15/17 0954  BP: 98/65 (!) 94/58  99/71  Pulse: 64 65 77 73  Resp: 18 17 18 18   Temp: 98.5 F (36.9 C) 98.3 F (36.8 C)  97.9 F (36.6 C)  TempSrc: Oral Oral  Oral  SpO2: 96% 94% 98% 98%  Weight:      Height:        Intake/Output Summary (Last 24 hours) at 09/15/2017 1203 Last data filed at 09/15/2017 0955 Gross per 24 hour  Intake 3 ml  Output 600 ml  Net -597 ml   Filed Weights   09/13/17 1555  Weight: 140 lb (63.5 kg)    Telemetry    NSR - Personally Reviewed   Physical Exam   General: NAD Neck: No JVD, no thyromegaly or thyroid nodule.  Lungs: Occasional rhonchi. CV: Nondisplaced PMI.  Heart regular S1/S2, no S3/S4, no murmur.  No peripheral edema.  No carotid bruit.  Normal pedal pulses.  Abdomen: Soft, nontender, no hepatosplenomegaly, no distention.  Skin: Intact without lesions or rashes.  Neurologic: Alert and oriented x 3.  Psych: Normal affect. Extremities: No clubbing or cyanosis. Right radial cath site benign.   HEENT: Normal.    Labs    Chemistry Recent  Labs  Lab 09/13/17 1554 09/13/17 1925 09/14/17 0514 09/15/17 0419  NA 136 134* 136 136  K 3.9 3.5 3.6 3.5  CL 102 102 107 107  CO2 23 26 21* 22  GLUCOSE 141* 136* 96 93  BUN 9 9 7 6   CREATININE 0.72 0.68 0.52* 0.69  CALCIUM 9.0 8.7* 8.6* 8.8*  PROT 7.2  --   --   --   ALBUMIN 3.8  --   --   --   AST 56*  --   --   --   ALT 31  --   --   --   ALKPHOS 49  --   --   --   BILITOT 0.6  --   --   --   GFRNONAA >60 >60 >60 >60  GFRAA >60 >60 >60 >60  ANIONGAP 11 6 8 7      Hematology Recent Labs  Lab 09/13/17 1554 09/14/17 0514 09/15/17 0419  WBC 8.7 7.2 8.1  RBC 4.89 4.41 4.52  HGB 14.9 13.4 13.7  HCT 43.3 39.4* 39.8*  MCV 88.4 89.4 88.2  MCH 30.5 30.4 30.3  MCHC 34.5 34.0 34.3  RDW 13.8 14.2 13.7  PLT 205 187 206    Cardiac Enzymes Recent Labs  Lab 09/13/17  1554 09/13/17 1925 09/13/17 2324 09/14/17 0514  TROPONINI 0.04* 14.73* 31.47* 23.96*   No results for input(s): TROPIPOC in the last 168 hours.   BNPNo results for input(s): BNP, PROBNP in the last 168 hours.   DDimer No results for input(s): DDIMER in the last 168 hours.   Radiology    No results found.   Patient Profile     56 y.o. male with PAD s/p aortobifemoral bypass, COPD/active smoker presented with anterolateral STEMI.   Assessment & Plan    1. CAD: Anterolateral STEMI, s/p DES to D1 on 11/30. Stable today.  - Continue ASA 81 + ticagrelor 90 bid.  - Continue high dose statin.  - Continue metoprolol 12.5 mg bid.   - BP too soft to add ACEI.   - EF preserved on echo with anterolateral hypokinesis.   2. Smoking/COPD: I strongly encouraged him to stop smoking.  3. PAD: s/p aortobifemoral bypass, denies claudication.   Home today, followup Dr Kirke CorinArida.   For questions or updates, please contact [facility] Please consult [email or web address] for contact info under Cardiology/STEMI.      Signed, Marca Anconaalton Evert Wenrich, MD  09/15/2017, 12:03 PM

## 2017-09-15 NOTE — Progress Notes (Signed)
[facility]         [place]   09/15/2017  Patient: [name]   Date of Birth:  1961-03-06  Date of admission:  09/13/2017  Date of Discharge  09/15/2017    To Whom it May Concern:   [name]  may return to work on 09/22/17.  PHYSICAL ACTIVITY:  Full  If you have any questions or concerns, please don't hesitate to call.  Sincerely,   Altamese DillingVaibhavkumar Jaqulyn Chancellor M.D Pager [phone]- [phone] Office : [phone]   .

## 2017-09-15 NOTE — Progress Notes (Signed)
Pt. Slept well throughout the night. HR ranged between 40's to 80 throughout the night except the 2 incidents already charted on.

## 2017-09-15 NOTE — Progress Notes (Signed)
Pt. Given discharge instructions, and prescriptions, voices understanding of medication regimen and site care.  VSS at this time, will discharge home via wheelchair with his friend.

## 2017-09-16 ENCOUNTER — Telehealth: Payer: Self-pay | Admitting: *Deleted

## 2017-09-16 ENCOUNTER — Encounter: Payer: Self-pay | Admitting: Cardiovascular Disease

## 2017-09-16 LAB — GLUCOSE, CAPILLARY: GLUCOSE-CAPILLARY: 114 mg/dL — AB (ref 65–99)

## 2017-09-16 NOTE — Telephone Encounter (Signed)
Left voicemail message to call back  

## 2017-09-16 NOTE — Telephone Encounter (Signed)
Pt returning our call °Please call back ° °

## 2017-09-16 NOTE — Telephone Encounter (Signed)
Patient contacted regarding discharge from [facility] on 09/15/17.  Patient understands to follow up with provider Dr. Kirke CorinArida on 09/23/17 at 2:40 PM [facility]. Patient understands discharge instructions? Yes Patient understands medications and regiment? Yes Patient understands to bring all medications to this visit? Yes  Patient verbalized understanding and confirmed appointment with no further questions at this time.

## 2017-09-16 NOTE — Telephone Encounter (Signed)
-----   Message from Coralee RudSabrina F Gilley sent at 09/16/2017  9:31 AM EST ----- Regarding: tcm/ph 12/10 2:40 Dr. Kirke CorinArida

## 2017-09-19 ENCOUNTER — Telehealth: Payer: Self-pay | Admitting: Cardiovascular Disease

## 2017-09-19 ENCOUNTER — Encounter: Payer: Self-pay | Admitting: Cardiovascular Disease

## 2017-09-19 ENCOUNTER — Ambulatory Visit: Payer: Self-pay | Admitting: Cardiovascular Disease

## 2017-09-19 VITALS — BP 106/78 | HR 76 | Ht 67.0 in | Wt 141.5 lb

## 2017-09-19 DIAGNOSIS — E785 Hyperlipidemia, unspecified: Secondary | ICD-10-CM

## 2017-09-19 DIAGNOSIS — Z720 Tobacco use: Secondary | ICD-10-CM

## 2017-09-19 DIAGNOSIS — I252 Old myocardial infarction: Secondary | ICD-10-CM

## 2017-09-19 DIAGNOSIS — I739 Peripheral vascular disease, unspecified: Secondary | ICD-10-CM

## 2017-09-19 MED ORDER — CLOPIDOGREL BISULFATE 75 MG PO TABS: 75.0000 mg | ORAL_TABLET | Freq: Every day | ORAL | 1 refills | Status: DC

## 2017-09-19 NOTE — Patient Instructions (Signed)
Medication Instructions:  Your physician has recommended you make the following change in your medication:  START taking plavix 75mg  daily AFTER you finish your Brilinta prescription. On the first day you take plavix, take 2 tablets (150mg ) then start taking plavix, 1 tablet daily.    Labwork: none  Testing/Procedures: none  Follow-Up: Your physician recommends that you schedule a follow-up appointment in: 3 months with Dr. Kirke CorinArida.    Any Other Special Instructions Will Be Listed Below (If Applicable).     If you need a refill on your cardiac medications before your next appointment, please call your pharmacy.

## 2017-09-19 NOTE — Telephone Encounter (Signed)
Pt given verbal and written instructed to wait until he finishes current Brilinta prescription before starting Plavix.  [facility] to confirm pt may not fill new Plavix prescription until December 30.

## 2017-09-19 NOTE — Progress Notes (Signed)
Cardiology Office Note   Date:  09/19/2017   ID:  Darrol [place]okeroy [place], [place] 1961/09/24, MRN [id number]  PCP:  Iran OuchArida, Kalijah Zeiss A, MD  Cardiologist:   Lorine BearsMuhammad Yazmyn Valbuena, MD   Chief Complaint  Patient presents with  . other    Follow up from cardiac cath & stent placement. Meds reviewed by the pt. verbally. Pt. c/o chest pain and some shortness of breath.       History of Present Illness: [name] Wayne Vierra is a 56 y.o. male who presents for follow-up visit regarding coronary artery disease with recent anterolateral ST elevation myocardial infarction. He has known history of peripheral arterial disease status post aortobifemoral bypass in 2015 done by Dr. Gilda CreaseSchnier, COPD, hyperlipidemia and tobacco use who presented recently with acute onset of chest pain.  He was found to have anterolateral ST elevation.  I proceeded with emergent cardiac catheterization which showed subtotal occlusion of large first diagonal with moderate proximal LAD disease and moderate RCA disease.  Coronary arteries were moderately calcified.  Ejection fraction was normal.  I performed successful angioplasty and drug-eluting stent placement to the first diagonal.  Drug screen was positive for cocaine. He has been doing well since hospital discharge.  He reports chest pain only in the morning after he wakes up described as aching different from his myocardial infarction.  He has no exertional symptoms.  No shortness of breath.  He reports that he quit smoking since hospital discharge. He has been taking his medications regularly.  Past Medical History:  Diagnosis Date  . COPD (chronic obstructive pulmonary disease) (HCC)   . Coronary artery disease 08/2017   Anterolateral ST elevation myocardial infarction.  Cardiac catheterization showed subtotal occlusion of first diagonal treated successfully with PCI and drug-eluting stent placement.  There was moderate disease affecting the proximal LAD and mid to distal RCA.   Ejection fraction was normal.  . Hyperlipidemia   . Medical history non-contributory   . PAD (peripheral artery disease) (HCC)    Status post aortobifemoral bypass in 2015  . Tobacco use     Past Surgical History:  Procedure Laterality Date  . BYPASS GRAFT     in both lower limbs  . CORONARY/GRAFT ACUTE MI REVASCULARIZATION N/A 09/13/2017   Procedure: Coronary/Graft Acute MI Revascularization;  Surgeon: Iran OuchArida, Keiara Sneeringer A, MD;  Location: [facility] INVASIVE CV LAB;  Service: Cardiovascular;  Laterality: N/A;  . LEFT HEART CATH AND CORONARY ANGIOGRAPHY N/A 09/13/2017   Procedure: LEFT HEART CATH AND CORONARY ANGIOGRAPHY;  Surgeon: Iran OuchArida, Afia Messenger A, MD;  Location: [facility] INVASIVE CV LAB;  Service: Cardiovascular;  Laterality: N/A;     Current Outpatient Medications  Medication Sig Dispense Refill  . aspirin 81 MG chewable tablet Chew 1 tablet (81 mg total) by mouth daily. 30 tablet 0  . atorvastatin (LIPITOR) 80 MG tablet Take 1 tablet (80 mg total) by mouth daily at 6 PM. 30 tablet 0  . metoprolol tartrate (LOPRESSOR) 25 MG tablet Take 0.5 tablets (12.5 mg total) by mouth 2 (two) times daily. 60 tablet 0  . nitroGLYCERIN (NITROSTAT) 0.4 MG SL tablet Place 1 tablet (0.4 mg total) under the tongue every 5 (five) minutes as needed for chest pain. 20 tablet 0  . clopidogrel (PLAVIX) 75 MG tablet Take 1 tablet (75 mg total) by mouth daily. 90 tablet 1   No current facility-administered medications for this visit.     Allergies:   Patient has no known allergies.    Social History:  The  patient  reports that he quit smoking 6 days ago. His smoking use included cigarettes. He has a 11.25 pack-year smoking history. he has never used smokeless tobacco. He reports that he drinks about 1.2 oz of alcohol per week. He reports that he does not use drugs.   Family History:  The patient's family history includes CAD in his father; Heart attack (age of onset: 3152) in his father.    ROS:  Please see the  history of present illness.   Otherwise, review of systems are positive for none.   All other systems are reviewed and negative.    PHYSICAL EXAM: VS:  BP 106/78 (BP Location: Left Arm, Patient Position: Sitting, Cuff Size: Normal)   Pulse 76   Ht 5\' 7"  (1.702 m)   Wt 141 lb 8 oz (64.2 kg)   BMI 22.16 kg/m  , BMI Body mass index is 22.16 kg/m. GEN: Well nourished, well developed, in no acute distress  HEENT: normal  Neck: no JVD, carotid bruits, or masses Cardiac: RRR; no murmurs, rubs, or gallops,no edema  Respiratory:  clear to auscultation bilaterally, normal work of breathing GI: soft, nontender, nondistended, + BS MS: no deformity or atrophy  Skin: warm and dry, no rash Neuro:  Strength and sensation are intact Psych: euthymic mood, full affect Right radial pulse is normal with no hematoma   EKG:  EKG is ordered today. The ekg ordered today demonstrates normal sinus rhythm with anterior T wave changes suggestive of ischemia.   Recent Labs: 09/13/2017: ALT 31; Magnesium 1.7 09/15/2017: BUN 6; Creatinine, Ser 0.69; Hemoglobin 13.7; Platelets 206; Potassium 3.5; Sodium 136    Lipid Panel    Component Value Date/Time   CHOL 171 09/14/2017 0514   TRIG 91 09/14/2017 0514   HDL 68 09/14/2017 0514   CHOLHDL 2.5 09/14/2017 0514   VLDL 18 09/14/2017 0514   LDLCALC 85 09/14/2017 0514      Wt Readings from Last 3 Encounters:  09/19/17 141 lb 8 oz (64.2 kg)  09/13/17 140 lb (63.5 kg)      No flowsheet data found.    ASSESSMENT AND PLAN:  1.  Recent anterolateral ST elevation myocardial infarction: Treated successfully with PCI and drug-eluting stent placement to the first diagonal.  The patient has residual moderate disease involving proximal LAD and right coronary artery.  I discussed with him the importance of healthy lifestyle changes. Unfortunately, he is not able to attend cardiac rehab.  He does not have health insurance. I stressed the importance of taking  medications regularly.  He is not going to be able to afford Brilinta and thus we will switch him to clopidogrel after 1 month of treatment.  2.  Hyperlipidemia: Continue high-dose atorvastatin.  He will need a follow-up lipid and liver profile in 2 months.  3.  Tobacco use: He reports quitting smoking after his cardiac event.  4.  Peripheral arterial disease status post aortobifemoral bypass: No claudication.  5.  Drug use: I discussed with him the issue of positive cocaine.  He reports that he does not use this on a regular basis.  I strongly advised him to avoid this altogether.    Disposition:   FU with me in 3 months  Signed,  Lorine BearsMuhammad Lenton Gendreau, MD  09/19/2017 2:30 PM    [facility]

## 2017-09-23 ENCOUNTER — Ambulatory Visit: Payer: Self-pay | Admitting: Cardiovascular Disease

## 2017-10-28 ENCOUNTER — Other Ambulatory Visit: Payer: Self-pay | Admitting: Cardiovascular Disease

## 2017-12-05 ENCOUNTER — Other Ambulatory Visit: Payer: Self-pay

## 2017-12-05 MED ORDER — METOPROLOL TARTRATE 25 MG PO TABS: 12.5000 mg | ORAL_TABLET | Freq: Two times a day (BID) | ORAL | 0 refills | Status: DC

## 2017-12-05 MED ORDER — ATORVASTATIN CALCIUM 80 MG PO TABS: ORAL_TABLET | ORAL | 0 refills | Status: DC

## 2017-12-05 MED ORDER — CLOPIDOGREL BISULFATE 75 MG PO TABS: 75.0000 mg | ORAL_TABLET | Freq: Every day | ORAL | 0 refills | Status: DC

## 2017-12-05 NOTE — Telephone Encounter (Signed)
*  STAT* If patient is at the pharmacy, call can be transferred to refill team.   1. Which medications need to be refilled? (please list name of each medication and dose if known) Lipitor, Plavix, Metoprolol  2. Which pharmacy/location (including street and city if local pharmacy) is medication to be sent to?[facility] [address]  3. Do they need a 30 day or 90 day supply? Pt not sure

## 2017-12-20 ENCOUNTER — Encounter: Payer: Self-pay | Admitting: Cardiovascular Disease

## 2017-12-20 ENCOUNTER — Ambulatory Visit: Payer: Medicaid Other | Admitting: Cardiovascular Disease

## 2017-12-20 VITALS — BP 128/82 | HR 84 | Ht 67.0 in | Wt 152.0 lb

## 2017-12-20 DIAGNOSIS — I252 Old myocardial infarction: Secondary | ICD-10-CM

## 2017-12-20 DIAGNOSIS — I739 Peripheral vascular disease, unspecified: Secondary | ICD-10-CM | POA: Diagnosis not present

## 2017-12-20 DIAGNOSIS — I2510 Atherosclerotic heart disease of native coronary artery without angina pectoris: Secondary | ICD-10-CM

## 2017-12-20 DIAGNOSIS — E7800 Pure hypercholesterolemia, unspecified: Secondary | ICD-10-CM

## 2017-12-20 MED ORDER — NITROGLYCERIN 0.4 MG SL SUBL: 0.4000 mg | SUBLINGUAL_TABLET | SUBLINGUAL | 3 refills | Status: DC | PRN

## 2017-12-20 MED ORDER — ATORVASTATIN CALCIUM 80 MG PO TABS: ORAL_TABLET | ORAL | 5 refills | Status: DC

## 2017-12-20 MED ORDER — CLOPIDOGREL BISULFATE 75 MG PO TABS: 75.0000 mg | ORAL_TABLET | Freq: Every day | ORAL | 5 refills | Status: DC

## 2017-12-20 MED ORDER — METOPROLOL SUCCINATE ER 25 MG PO TB24: 25.0000 mg | ORAL_TABLET | Freq: Every day | ORAL | 5 refills | Status: DC

## 2017-12-20 NOTE — Progress Notes (Signed)
Cardiology Office Note   Date:  12/20/2017   ID:  [name] [place], [place] July 20, 1961, MRN [id number]  PCP:  Iran OuchArida, Muhammad A, MD  Cardiologist:   Lorine BearsMuhammad Arida, MD   Chief Complaint  Patient presents with  . Other    3 month follow up. Patient c/o SOB and Chest pain. Meds reviewed verbally with patient.       History of Present Illness: [name] [name] is a 57 y.o. male who presents for follow-up visit regarding coronary artery disease. He has known history of peripheral arterial disease status post aortobifemoral bypass in 2015 done by Dr. Gilda CreaseSchnier, COPD, hyperlipidemia and tobacco use.  He had anterolateral ST elevation myocardial infarction in November 2018.  Emergent cardiac catheterization showed subtotal occlusion of large first diagonal with moderate proximal LAD disease and moderate RCA disease.  Coronary arteries were moderately calcified.  Ejection fraction was normal.  I performed successful angioplasty and drug-eluting stent placement to the first diagonal.  Drug screen was positive for cocaine.  He quit smoking after myocardial infarction.  He has been doing well overall from a cardiac standpoint.  However, he is suffering from an upper respiratory tract infection.  He reports recent episodes of sharp chest pain when he takes a deep breath.  He has been taking his medications regularly but he forgets to take the evening dose of metoprolol.  Past Medical History:  Diagnosis Date  . COPD (chronic obstructive pulmonary disease) (HCC)   . Coronary artery disease 08/2017   Anterolateral ST elevation myocardial infarction.  Cardiac catheterization showed subtotal occlusion of first diagonal treated successfully with PCI and drug-eluting stent placement.  There was moderate disease affecting the proximal LAD and mid to distal RCA.  Ejection fraction was normal.  . Hyperlipidemia   . Medical history non-contributory   . PAD (peripheral artery disease) (HCC)    Status post aortobifemoral bypass in 2015  . Tobacco use     Past Surgical History:  Procedure Laterality Date  . BYPASS GRAFT     in both lower limbs  . CORONARY/GRAFT ACUTE MI REVASCULARIZATION N/A 09/13/2017   Procedure: Coronary/Graft Acute MI Revascularization;  Surgeon: Iran OuchArida, Muhammad A, MD;  Location: [facility] INVASIVE CV LAB;  Service: Cardiovascular;  Laterality: N/A;  . LEFT HEART CATH AND CORONARY ANGIOGRAPHY N/A 09/13/2017   Procedure: LEFT HEART CATH AND CORONARY ANGIOGRAPHY;  Surgeon: Iran OuchArida, Muhammad A, MD;  Location: [facility] INVASIVE CV LAB;  Service: Cardiovascular;  Laterality: N/A;     Current Outpatient Medications  Medication Sig Dispense Refill  . aspirin 81 MG chewable tablet Chew 1 tablet (81 mg total) by mouth daily. 30 tablet 0  . atorvastatin (LIPITOR) 80 MG tablet TAKE 1 TABLET BY MOUTH EVERY DAY AT 6PM 30 tablet 0  . clopidogrel (PLAVIX) 75 MG tablet Take 1 tablet (75 mg total) by mouth daily. 30 tablet 0  . metoprolol tartrate (LOPRESSOR) 25 MG tablet Take 0.5 tablets (12.5 mg total) by mouth 2 (two) times daily. 60 tablet 0  . nitroGLYCERIN (NITROSTAT) 0.4 MG SL tablet Place 1 tablet (0.4 mg total) under the tongue every 5 (five) minutes as needed for chest pain. 20 tablet 0   No current facility-administered medications for this visit.     Allergies:   Patient has no known allergies.    Social History:  The patient  reports that he quit smoking about 3 months ago. His smoking use included cigarettes. He has a 11.25 pack-year smoking history. he has  never used smokeless tobacco. He reports that he drinks about 1.2 oz of alcohol per week. He reports that he does not use drugs.   Family History:  The patient's family history includes CAD in his father; Heart attack (age of onset: 36) in his father.    ROS:  Please see the history of present illness.   Otherwise, review of systems are positive for none.   All other systems are reviewed and negative.     PHYSICAL EXAM: VS:  BP 128/82 (BP Location: Left Arm, Patient Position: Sitting, Cuff Size: Normal)   Pulse 84   Ht 5\' 7"  (1.702 m)   Wt 152 lb (68.9 kg)   BMI 23.81 kg/m  , BMI Body mass index is 23.81 kg/m. GEN: Well nourished, well developed, in no acute distress  HEENT: normal  Neck: no JVD, carotid bruits, or masses Cardiac: RRR; no murmurs, rubs, or gallops,no edema  Respiratory:  clear to auscultation bilaterally, normal work of breathing GI: soft, nontender, nondistended, + BS MS: no deformity or atrophy  Skin: warm and dry, no rash Neuro:  Strength and sensation are intact Psych: euthymic mood, full affect   EKG:  EKG is ordered today. The ekg ordered today demonstrates normal sinus rhythm with no significant ST or T wave changes.   Recent Labs: 09/13/2017: ALT 31; Magnesium 1.7 09/15/2017: BUN 6; Creatinine, Ser 0.69; Hemoglobin 13.7; Platelets 206; Potassium 3.5; Sodium 136    Lipid Panel    Component Value Date/Time   CHOL 171 09/14/2017 0514   TRIG 91 09/14/2017 0514   HDL 68 09/14/2017 0514   CHOLHDL 2.5 09/14/2017 0514   VLDL 18 09/14/2017 0514   LDLCALC 85 09/14/2017 0514      Wt Readings from Last 3 Encounters:  12/20/17 152 lb (68.9 kg)  09/19/17 141 lb 8 oz (64.2 kg)  09/13/17 140 lb (63.5 kg)      No flowsheet data found.    ASSESSMENT AND PLAN:  1.  Coronary artery disease involving native coronary arteries without angina: He is overall doing very well.  His current chest pain is atypical and seems to be pleuritic likely related to his recent upper respiratory tract infection.  EKG is back to normal.  Continue dual antiplatelet therapy at least until November 2019. I switched metoprolol tartrate to metoprolol succinate 25 mg once daily for ease of administration given that he forgets the evening dose.  2.  Hyperlipidemia: Continue high-dose atorvastatin.  I ordered lipid and liver profile to be done today.  3.  Previous tobacco  use: No recurrent tobacco use since his myocardial infarction.  4.  Peripheral arterial disease status post aortobifemoral bypass: No claudication.   Disposition:   FU with me in 6 months  Signed,  Lorine Bears, MD  12/20/2017 10:54 AM    [facility]

## 2017-12-20 NOTE — Patient Instructions (Signed)
Medication Instructions:  Your physician has recommended you make the following change in your medication:  STOP taking metoprolol tartrate START taking metoprolol ER 25mg  once daily. [facility] [address] [place]    Labwork: Lipid and liver profile today  Testing/Procedures: none  Follow-Up: Your physician wants you to follow-up in: 6 months with Dr. Kirke CorinArida.  You will receive a reminder letter in the mail two months in advance. If you don't receive a letter, please call our office to schedule the follow-up appointment.   Any Other Special Instructions Will Be Listed Below (If Applicable).     If you need a refill on your cardiac medications before your next appointment, please call your pharmacy.

## 2017-12-21 LAB — LIPID PANEL
CHOLESTEROL TOTAL: 170 mg/dL (ref 100–199)
Chol/HDL Ratio: 3.3 ratio (ref 0.0–5.0)
HDL: 51 mg/dL (ref >39)
LDL CALC: 94 mg/dL (ref 0–99)
Triglycerides: 123 mg/dL (ref 0–149)
VLDL Cholesterol Cal: 25 mg/dL (ref 5–40)

## 2017-12-21 LAB — HEPATIC FUNCTION PANEL
ALT: 20 IU/L (ref 0–44)
AST: 28 IU/L (ref 0–40)
Albumin: 4.5 g/dL (ref 3.5–5.5)
Alkaline Phosphatase: 75 IU/L (ref 39–117)
Bilirubin Total: 0.3 mg/dL (ref 0.0–1.2)
Bilirubin, Direct: 0.09 mg/dL (ref 0.00–0.40)
Total Protein: 8 g/dL (ref 6.0–8.5)

## 2018-01-02 ENCOUNTER — Telehealth: Payer: Self-pay | Admitting: Cardiovascular Disease

## 2018-01-02 NOTE — Telephone Encounter (Signed)
Received records request Disability Determination Services, forwarded to [employer] for processing.  

## 2018-02-10 NOTE — Telephone Encounter (Signed)
Received records request Disability Determination Services via mail , forwarded to [facility] for processing.

## 2018-07-11 ENCOUNTER — Other Ambulatory Visit: Payer: Self-pay | Admitting: Cardiovascular Disease

## 2018-07-11 ENCOUNTER — Telehealth: Payer: Self-pay | Admitting: Cardiovascular Disease

## 2018-07-11 NOTE — Telephone Encounter (Signed)
°*  STAT* If patient is at the pharmacy, call can be transferred to refill team.   1. Which medications need to be refilled? (please list name of each medication and dose if known) Metoprolol  and Clopidogrel  2. Which pharmacy/location (including street and city if local pharmacy) is medication to be sent to? [facility] [address]   3. Do they need a 30 day or 90 day supply? 30 day

## 2018-07-16 ENCOUNTER — Telehealth: Payer: Self-pay | Admitting: Cardiovascular Disease

## 2018-07-16 NOTE — Telephone Encounter (Signed)
Recieved request from : [employer] [place]   Scanned to release   Forwarded to [employer] for processing

## 2018-07-22 ENCOUNTER — Ambulatory Visit (INDEPENDENT_AMBULATORY_CARE_PROVIDER_SITE_OTHER): Payer: Medicaid Other | Admitting: Cardiovascular Disease

## 2018-07-22 ENCOUNTER — Encounter: Payer: Self-pay | Admitting: Cardiovascular Disease

## 2018-07-22 ENCOUNTER — Other Ambulatory Visit: Payer: Self-pay | Admitting: Cardiovascular Disease

## 2018-07-22 VITALS — BP 132/66 | HR 89 | Ht 67.0 in | Wt 150.8 lb

## 2018-07-22 DIAGNOSIS — I739 Peripheral vascular disease, unspecified: Secondary | ICD-10-CM | POA: Diagnosis not present

## 2018-07-22 DIAGNOSIS — I2510 Atherosclerotic heart disease of native coronary artery without angina pectoris: Secondary | ICD-10-CM | POA: Diagnosis not present

## 2018-07-22 DIAGNOSIS — E785 Hyperlipidemia, unspecified: Secondary | ICD-10-CM

## 2018-07-22 DIAGNOSIS — Z720 Tobacco use: Secondary | ICD-10-CM

## 2018-07-22 MED ORDER — ATORVASTATIN CALCIUM 80 MG PO TABS: ORAL_TABLET | ORAL | 5 refills | Status: DC

## 2018-07-22 MED ORDER — CLOPIDOGREL BISULFATE 75 MG PO TABS: 75.0000 mg | ORAL_TABLET | Freq: Every day | ORAL | 3 refills | Status: DC

## 2018-07-22 MED ORDER — NITROGLYCERIN 0.4 MG SL SUBL: 0.4000 mg | SUBLINGUAL_TABLET | SUBLINGUAL | 3 refills | Status: DC | PRN

## 2018-07-22 MED ORDER — METOPROLOL SUCCINATE ER 25 MG PO TB24: 25.0000 mg | ORAL_TABLET | Freq: Every day | ORAL | 3 refills | Status: DC

## 2018-07-22 NOTE — Progress Notes (Signed)
Cardiology Office Note   Date:  07/22/2018   ID:  [name], [place] September 15, 1961, MRN [id number]  PCP:  Iran Ouch, MD  Cardiologist:   Lorine Bears, MD   Chief Complaint  Patient presents with  . other    6 mo F/u CP and SOB in the AM and right arm pain since stent..Medications reviewed verbally.       History of Present Illness: [name] is a 57 y.o. male who presents for follow-up visit regarding coronary artery disease. He has known history of peripheral arterial disease status post aortobifemoral bypass in 2015 done by Dr. Gilda Crease, COPD, hyperlipidemia and tobacco use.  He had anterolateral ST elevation myocardial infarction in November 2018.  Emergent cardiac catheterization showed subtotal occlusion of large first diagonal with moderate proximal LAD disease and moderate RCA disease.  Coronary arteries were moderately calcified.  Ejection fraction was normal.  I performed successful angioplasty and drug-eluting stent placement to the first diagonal.  Drug screen was positive for cocaine.  He quit smoking after myocardial infarction.  However, he relapsed again after the death of his friend and he is smoking again.  He does not think he can quit at the present time.  He has been relatively stable from a cardiac standpoint with no chest pain.  He has chronic exertional dyspnea with no recent worsening.  Past Medical History:  Diagnosis Date  . COPD (chronic obstructive pulmonary disease) (HCC)   . Coronary artery disease 08/2017   Anterolateral ST elevation myocardial infarction.  Cardiac catheterization showed subtotal occlusion of first diagonal treated successfully with PCI and drug-eluting stent placement.  There was moderate disease affecting the proximal LAD and mid to distal RCA.  Ejection fraction was normal.  . Hyperlipidemia   . Medical history non-contributory   . PAD (peripheral artery disease) (HCC)    Status post aortobifemoral  bypass in 2015  . Tobacco use     Past Surgical History:  Procedure Laterality Date  . BYPASS GRAFT     in both lower limbs  . CORONARY/GRAFT ACUTE MI REVASCULARIZATION N/A 09/13/2017   Procedure: Coronary/Graft Acute MI Revascularization;  Surgeon: Iran Ouch, MD;  Location: [facility] INVASIVE CV LAB;  Service: Cardiovascular;  Laterality: N/A;  . LEFT HEART CATH AND CORONARY ANGIOGRAPHY N/A 09/13/2017   Procedure: LEFT HEART CATH AND CORONARY ANGIOGRAPHY;  Surgeon: Iran Ouch, MD;  Location: [facility] INVASIVE CV LAB;  Service: Cardiovascular;  Laterality: N/A;     Current Outpatient Medications  Medication Sig Dispense Refill  . aspirin 81 MG chewable tablet Chew 1 tablet (81 mg total) by mouth daily. 30 tablet 0  . atorvastatin (LIPITOR) 80 MG tablet TAKE 1 TABLET BY MOUTH EVERY DAY AT 6PM 30 tablet 5  . clopidogrel (PLAVIX) 75 MG tablet TAKE 1 TABLET BY MOUTH ONCE DAILY 90 tablet 3  . metoprolol succinate (TOPROL-XL) 25 MG 24 hr tablet TAKE 1 TABLET BY MOUTH ONCE DAILY (TAKE  WITH  OR  IMMEDIATELY  FOLLOWING  A  MEAL) 90 tablet 3  . nitroGLYCERIN (NITROSTAT) 0.4 MG SL tablet Place 1 tablet (0.4 mg total) under the tongue every 5 (five) minutes as needed for chest pain. 25 tablet 3   No current facility-administered medications for this visit.     Allergies:   Patient has no known allergies.    Social History:  The patient  reports that he quit smoking about 10 months ago. His smoking use included cigarettes. He  has a 11.25 pack-year smoking history. He has never used smokeless tobacco. He reports that he drinks about 2.0 standard drinks of alcohol per week. He reports that he does not use drugs.   Family History:  The patient's family history includes CAD in his father; Heart attack (age of onset: 30) in his father.    ROS:  Please see the history of present illness.   Otherwise, review of systems are positive for none.   All other systems are reviewed and negative.     PHYSICAL EXAM: VS:  BP 132/66 (BP Location: Left Arm, Patient Position: Sitting, Cuff Size: Normal)   Pulse 89   Ht 5\' 7"  (1.702 m)   Wt 150 lb 12 oz (68.4 kg)   BMI 23.61 kg/m  , BMI Body mass index is 23.61 kg/m. GEN: Well nourished, well developed, in no acute distress  HEENT: normal  Neck: no JVD, carotid bruits, or masses Cardiac: RRR; no murmurs, rubs, or gallops,no edema  Respiratory:  clear to auscultation bilaterally, normal work of breathing GI: soft, nontender, nondistended, + BS MS: no deformity or atrophy  Skin: warm and dry, no rash Neuro:  Strength and sensation are intact Psych: euthymic mood, full affect   EKG:  EKG is ordered today. The ekg ordered today demonstrates normal sinus rhythm with no significant ST or T wave changes.   Recent Labs: 09/13/2017: Magnesium 1.7 09/15/2017: BUN 6; Creatinine, Ser 0.69; Hemoglobin 13.7; Platelets 206; Potassium 3.5; Sodium 136 12/20/2017: ALT 20    Lipid Panel    Component Value Date/Time   CHOL 170 12/20/2017 1124   TRIG 123 12/20/2017 1124   HDL 51 12/20/2017 1124   CHOLHDL 3.3 12/20/2017 1124   CHOLHDL 2.5 09/14/2017 0514   VLDL 18 09/14/2017 0514   LDLCALC 94 12/20/2017 1124      Wt Readings from Last 3 Encounters:  07/22/18 150 lb 12 oz (68.4 kg)  12/20/17 152 lb (68.9 kg)  09/19/17 141 lb 8 oz (64.2 kg)      No flowsheet data found.    ASSESSMENT AND PLAN:  1.  Coronary artery disease involving native coronary arteries without angina: He is overall doing very well.  No anginal symptoms.  Continue medical therapy.  Dual antiplatelet therapy is optional after November.  I might consider switching him from Plavix to low-dose Xarelto given CAD and PAD.  2.  Hyperlipidemia: Continue high-dose atorvastatin.  I reviewed lipid profile with him which showed a total cholesterol of 170, triglyceride of 123, HDL of 51 and an LDL of 94.  I discussed with him the importance of improving his diet.  If LDL  remains above 70, I will consider adding Zetia.  3.  Tobacco use: He quit after his myocardial infarction but unfortunately started smoking again.  I discussed with him the importance of smoking cessation.  4.  Peripheral arterial disease status post aortobifemoral bypass: No claudication.   Disposition:   FU with me in 6 months  Signed,  Lorine Bears, MD  07/22/2018 3:27 PM    [facility]

## 2018-07-22 NOTE — Patient Instructions (Signed)
Medication Instructions: Continue same medications.   Labwork: None.   Procedures/Testing: None.   Follow-Up: 6 months with Dr. Arida.   Any Additional Special Instructions Will Be Listed Below (If Applicable).     If you need a refill on your cardiac medications before your next appointment, please call your pharmacy.   

## 2018-07-22 NOTE — Addendum Note (Signed)
Addended by: Aurelio Jew on: 07/22/2018 04:04 PM   Modules accepted: Orders

## 2018-08-08 ENCOUNTER — Telehealth: Payer: Self-pay | Admitting: Cardiovascular Disease

## 2018-08-08 NOTE — Telephone Encounter (Signed)
  Received records request [employer], forwarded to [facility] for processing. Faxed and interoffice to [employer]

## 2018-12-31 ENCOUNTER — Emergency Department: Payer: Medicaid Other

## 2018-12-31 ENCOUNTER — Emergency Department
Admission: EM | Admit: 2018-12-31 | Discharge: 2018-12-31 | Disposition: A | Discharge status: Home / Self Care | Payer: Medicaid Other | Attending: Emergency Medicine | Admitting: Emergency Medicine

## 2018-12-31 ENCOUNTER — Encounter: Payer: Self-pay | Admitting: Emergency Medicine

## 2018-12-31 ENCOUNTER — Other Ambulatory Visit: Payer: Self-pay

## 2018-12-31 DIAGNOSIS — Z87891 Personal history of nicotine dependence: Secondary | ICD-10-CM | POA: Insufficient documentation

## 2018-12-31 DIAGNOSIS — Z7901 Long term (current) use of anticoagulants: Secondary | ICD-10-CM | POA: Diagnosis not present

## 2018-12-31 DIAGNOSIS — Z7982 Long term (current) use of aspirin: Secondary | ICD-10-CM | POA: Insufficient documentation

## 2018-12-31 DIAGNOSIS — R0789 Other chest pain: Secondary | ICD-10-CM | POA: Diagnosis present

## 2018-12-31 DIAGNOSIS — J449 Chronic obstructive pulmonary disease, unspecified: Secondary | ICD-10-CM | POA: Diagnosis not present

## 2018-12-31 DIAGNOSIS — I2510 Atherosclerotic heart disease of native coronary artery without angina pectoris: Secondary | ICD-10-CM | POA: Diagnosis not present

## 2018-12-31 DIAGNOSIS — J40 Bronchitis, not specified as acute or chronic: Secondary | ICD-10-CM | POA: Insufficient documentation

## 2018-12-31 DIAGNOSIS — R0602 Shortness of breath: Secondary | ICD-10-CM

## 2018-12-31 DIAGNOSIS — Z79899 Other long term (current) drug therapy: Secondary | ICD-10-CM | POA: Diagnosis not present

## 2018-12-31 LAB — COMPREHENSIVE METABOLIC PANEL
ALT: 15 U/L (ref 0–44)
AST: 21 U/L (ref 15–41)
Albumin: 4.1 g/dL (ref 3.5–5.0)
Alkaline Phosphatase: 63 U/L (ref 38–126)
Anion gap: 11 (ref 5–15)
BILIRUBIN TOTAL: 0.7 mg/dL (ref 0.3–1.2)
BUN: 7 mg/dL (ref 6–20)
CO2: 21 mmol/L — ABNORMAL LOW (ref 22–32)
Calcium: 8.8 mg/dL — ABNORMAL LOW (ref 8.9–10.3)
Chloride: 102 mmol/L (ref 98–111)
Creatinine, Ser: 0.67 mg/dL (ref 0.61–1.24)
GFR calc Af Amer: >60 mL/min (ref >60)
GFR calc non Af Amer: >60 mL/min (ref >60)
Glucose, Bld: 105 mg/dL — ABNORMAL HIGH (ref 70–99)
POTASSIUM: 3.9 mmol/L (ref 3.5–5.1)
Sodium: 134 mmol/L — ABNORMAL LOW (ref 135–145)
TOTAL PROTEIN: 7.8 g/dL (ref 6.5–8.1)

## 2018-12-31 LAB — DIFFERENTIAL
BASOS ABS: 0.1 10*3/uL (ref 0.0–0.1)
Band Neutrophils: 0 %
Basophils Relative: 1 %
Blasts: 0 %
EOS ABS: 0.1 10*3/uL (ref 0.0–0.5)
Eosinophils Relative: 1 %
LYMPHS PCT: 13 %
Lymphs Abs: 1.8 10*3/uL (ref 0.7–4.0)
MONO ABS: 1 10*3/uL (ref 0.1–1.0)
Metamyelocytes Relative: 0 %
Monocytes Relative: 7 %
Myelocytes: 0 %
Neutro Abs: 11.2 10*3/uL — ABNORMAL HIGH (ref 1.7–7.7)
Neutrophils Relative %: 78 %
Other: 0 %
Promyelocytes Relative: 0 %
nRBC: 0 /100 WBC

## 2018-12-31 LAB — TROPONIN I: Troponin I: <0.03 ng/mL (ref <0.03)

## 2018-12-31 LAB — CBC
HCT: 43.8 % (ref 39.0–52.0)
HCT: 43.9 % (ref 39.0–52.0)
Hemoglobin: 15.2 g/dL (ref 13.0–17.0)
Hemoglobin: 15.2 g/dL (ref 13.0–17.0)
MCH: 30 pg (ref 26.0–34.0)
MCH: 30 pg (ref 26.0–34.0)
MCHC: 34.6 g/dL (ref 30.0–36.0)
MCHC: 34.7 g/dL (ref 30.0–36.0)
MCV: 86.6 fL (ref 80.0–100.0)
MCV: 86.6 fL (ref 80.0–100.0)
PLATELETS: 281 10*3/uL (ref 150–400)
Platelets: 280 10*3/uL (ref 150–400)
RBC: 5.06 MIL/uL (ref 4.22–5.81)
RBC: 5.07 MIL/uL (ref 4.22–5.81)
RDW: 13.9 % (ref 11.5–15.5)
RDW: 13.9 % (ref 11.5–15.5)
WBC: 14.6 10*3/uL — ABNORMAL HIGH (ref 4.0–10.5)
WBC: 14.2 10*3/uL — ABNORMAL HIGH (ref 4.0–10.5)
nRBC: 0 % (ref 0.0–0.2)
nRBC: 0 % (ref 0.0–0.2)

## 2018-12-31 LAB — INFLUENZA PANEL BY PCR (TYPE A & B)
Influenza A By PCR: NEGATIVE
Influenza B By PCR: NEGATIVE

## 2018-12-31 MED ORDER — ALBUTEROL SULFATE HFA 108 (90 BASE) MCG/ACT IN AERS
1.0000 | INHALATION_SPRAY | RESPIRATORY_TRACT | Status: DC
  Administered 2018-12-31 (×2): 1 via RESPIRATORY_TRACT
  Filled 2018-12-31: qty 6.7

## 2018-12-31 MED ORDER — DOXYCYCLINE HYCLATE 100 MG PO CAPS: 100.0000 mg | ORAL_CAPSULE | Freq: Two times a day (BID) | ORAL | 0 refills | Status: AC

## 2018-12-31 MED ORDER — PREDNISONE 20 MG PO TABS: 60.0000 mg | ORAL_TABLET | Freq: Every day | ORAL | 0 refills | Status: AC

## 2018-12-31 MED ORDER — DOXYCYCLINE HYCLATE 100 MG PO TABS
100.0000 mg | ORAL_TABLET | Freq: Once | ORAL | Status: AC
  Administered 2018-12-31: 100 mg via ORAL
  Filled 2018-12-31: qty 1

## 2018-12-31 MED ORDER — ALBUTEROL SULFATE (2.5 MG/3ML) 0.083% IN NEBU: 3.0000 mL | INHALATION_SOLUTION | RESPIRATORY_TRACT | Status: DC

## 2018-12-31 MED ORDER — IOHEXOL 350 MG/ML SOLN
75.0000 mL | Freq: Once | INTRAVENOUS | Status: AC | PRN
  Administered 2018-12-31: 75 mL via INTRAVENOUS

## 2018-12-31 MED ORDER — METHYLPREDNISOLONE SODIUM SUCC 125 MG IJ SOLR
125.0000 mg | Freq: Once | INTRAMUSCULAR | Status: AC
  Administered 2018-12-31: 125 mg via INTRAVENOUS
  Filled 2018-12-31: qty 2

## 2018-12-31 MED ORDER — ALBUTEROL SULFATE HFA 108 (90 BASE) MCG/ACT IN AERS: 2.0000 | INHALATION_SPRAY | Freq: Four times a day (QID) | RESPIRATORY_TRACT | 2 refills | Status: AC | PRN

## 2018-12-31 NOTE — ED Provider Notes (Signed)
[facility] Emergency Department Provider Note  ____________________________________________  Time seen: Approximately 12:32 PM  I have reviewed the triage vital signs and the nursing notes.   HISTORY  Chief Complaint Chest Pain; Cough; and Shortness of Breath   HPI [name] is a 58 y.o. male with a history of COPD, CAD status post stent, PAD status post aortobifemoral bypass on Plavix who presents for evaluation of chest pain or shortness of breath.  Patient endorses 5 days of sore throat, congestion, cough, intermittent hemoptysis and progressively worsening shortness of breath and chest tightness.  This morning patient's shortness of breath became more severe.  He called his primary care doctor who recommended that he came to the emergency room for evaluation.  Patient has had subjective fevers at home.  No vomiting or diarrhea, no abdominal pain.  He denies any personal or family history of blood clots, recent travel immobilization, leg pain or swelling, or exogenous hormones.   Past Medical History:  Diagnosis Date  . COPD (chronic obstructive pulmonary disease) (HCC)   . Coronary artery disease 08/2017   Anterolateral ST elevation myocardial infarction.  Cardiac catheterization showed subtotal occlusion of first diagonal treated successfully with PCI and drug-eluting stent placement.  There was moderate disease affecting the proximal LAD and mid to distal RCA.  Ejection fraction was normal.  . Hyperlipidemia   . Medical history non-contributory   . PAD (peripheral artery disease) (HCC)    Status post aortobifemoral bypass in 2015  . Tobacco use     Patient Active Problem List   Diagnosis Date Noted  . PAD (peripheral artery disease) (HCC) 09/13/2017  . Acute ST elevation myocardial infarction (STEMI) of anterolateral wall (HCC) 09/13/2017  . ST elevation myocardial infarction (STEMI) [facility])     Past Surgical History:  Procedure  Laterality Date  . BYPASS GRAFT     in both lower limbs  . CORONARY/GRAFT ACUTE MI REVASCULARIZATION N/A 09/13/2017   Procedure: Coronary/Graft Acute MI Revascularization;  Surgeon: Iran Ouch, MD;  Location: [facility] INVASIVE CV LAB;  Service: Cardiovascular;  Laterality: N/A;  . LEFT HEART CATH AND CORONARY ANGIOGRAPHY N/A 09/13/2017   Procedure: LEFT HEART CATH AND CORONARY ANGIOGRAPHY;  Surgeon: Iran Ouch, MD;  Location: [facility] INVASIVE CV LAB;  Service: Cardiovascular;  Laterality: N/A;    Prior to Admission medications   Medication Sig Start Date End Date Taking? Authorizing Provider  albuterol (PROVENTIL HFA;VENTOLIN HFA) 108 (90 Base) MCG/ACT inhaler Inhale 2 puffs into the lungs every 6 (six) hours as needed for wheezing or shortness of breath. 12/31/18   Nita Sickle, MD  aspirin 81 MG chewable tablet Chew 1 tablet (81 mg total) by mouth daily. 09/16/17   Altamese Dilling, MD  atorvastatin (LIPITOR) 80 MG tablet TAKE 1 TABLET BY MOUTH EVERY DAY AT 6PM 07/22/18   Iran Ouch, MD  atorvastatin (LIPITOR) 80 MG tablet TAKE 1 TABLET BY MOUTH ONCE DAILY AT  6PM 07/22/18   Iran Ouch, MD  clopidogrel (PLAVIX) 75 MG tablet Take 1 tablet (75 mg total) by mouth daily. 07/22/18   Iran Ouch, MD  doxycycline (VIBRAMYCIN) 100 MG capsule Take 1 capsule (100 mg total) by mouth 2 (two) times daily for 7 days. 12/31/18 01/07/19  Nita Sickle, MD  metoprolol succinate (TOPROL-XL) 25 MG 24 hr tablet Take 1 tablet (25 mg total) by mouth daily. 07/22/18   Iran Ouch, MD  nitroGLYCERIN (NITROSTAT) 0.4 MG SL tablet Place 1 tablet (  0.4 mg total) under the tongue every 5 (five) minutes as needed for chest pain. 07/22/18   Iran Ouch, MD  predniSONE (DELTASONE) 20 MG tablet Take 3 tablets (60 mg total) by mouth daily for 4 days. 12/31/18 01/04/19  Nita Sickle, MD    Allergies Patient has no known allergies.  Family History  Problem Relation Age of  Onset  . CAD Father   . Heart attack Father 81    Social History Social History   Tobacco Use  . Smoking status: Former Smoker    Packs/day: 0.25    Years: 45.00    Pack years: 11.25    Types: Cigarettes    Last attempt to quit: 09/13/2017    Years since quitting: 1.2  . Smokeless tobacco: Never Used  Substance Use Topics  . Alcohol use: Yes    Alcohol/week: 2.0 standard drinks    Types: 2 Cans of beer per week  . Drug use: No    Review of Systems  Constitutional: + subjective fever. Eyes: Negative for visual changes. ENT: + sore throat, congestion Neck: No neck pain  Cardiovascular: + chest tightness Respiratory: + shortness of breath, cough, hemoptysis Gastrointestinal: Negative for abdominal pain, vomiting or diarrhea. Genitourinary: Negative for dysuria. Musculoskeletal: Negative for back pain. Skin: Negative for rash. Neurological: Negative for headaches, weakness or numbness. Psych: No SI or HI  ____________________________________________   PHYSICAL EXAM:  VITAL SIGNS: ED Triage Vitals  Enc Vitals Group     BP 12/31/18 1210 127/90     Pulse Rate 12/31/18 1210 (!) 103     Resp 12/31/18 1210 (!) 22     Temp 12/31/18 1210 98.2 F (36.8 C)     Temp Source 12/31/18 1210 Oral     SpO2 12/31/18 1210 96 %     Weight 12/31/18 1208 145 lb (65.8 kg)     Height 12/31/18 1208  (1.702 m)     Head Circumference --      Peak Flow --      Pain Score 12/31/18 1208 7     Pain Loc --      Pain Edu? --      Excl. in GC? --     Constitutional: Alert and oriented, mild increased work of breathing  HEENT:      Head: Normocephalic and atraumatic.         Eyes: Conjunctivae are normal. Sclera is non-icteric.       Mouth/Throat: Mucous membranes are moist.       Neck: Supple with no signs of meningismus. Cardiovascular: Tachycardic with regular rhythm. No murmurs, gallops, or rubs. 2+ symmetrical distal pulses are present in all extremities. No JVD.  Respiratory: Tachypneic with diffuse coarse rhonchi bilaterally, normal sats  Gastrointestinal: Soft, non tender, and non distended with positive bowel sounds. No rebound or guarding. Musculoskeletal: Nontender with normal range of motion in all extremities. No edema, cyanosis, or erythema of extremities. Neurologic: Normal speech and language. Face is symmetric. Moving all extremities. No gross focal neurologic deficits are appreciated. Skin: Skin is warm, dry and intact. No rash noted. Psychiatric: Mood and affect are normal. Speech and behavior are normal.  ____________________________________________   LABS (all labs ordered are listed, but only abnormal results are displayed)  Labs Reviewed  CBC - Abnormal; Notable for the following components:      Result Value   WBC 14.6 (*)    All other components within normal limits  DIFFERENTIAL - Abnormal; Notable for  the following components:   Neutro Abs 11.2 (*)    All other components within normal limits  CBC - Abnormal; Notable for the following components:   WBC 14.2 (*)    All other components within normal limits  COMPREHENSIVE METABOLIC PANEL - Abnormal; Notable for the following components:   Sodium 134 (*)    CO2 21 (*)    Glucose, Bld 105 (*)    Calcium 8.8 (*)    All other components within normal limits  INFLUENZA PANEL BY PCR (TYPE A & B)  TROPONIN I   ____________________________________________  EKG  ED ECG REPORT I, Nita Sickle, the attending physician, personally viewed and interpreted this ECG.  Normal sinus rhythm, rate of 96, normal intervals, normal axis, no ST elevations or depressions.  Unchanged from prior. ____________________________________________  RADIOLOGY  I have personally reviewed the images performed during this visit and I agree with the Radiologist's read.   Interpretation by Radiologist:  Ct Angio Chest Pe W Or Wo Contrast  Result Date: 12/31/2018 CLINICAL DATA:  Hemoptysis  EXAM: CT ANGIOGRAPHY CHEST WITH CONTRAST TECHNIQUE: Multidetector CT imaging of the chest was performed using the standard protocol during bolus administration of intravenous contrast. Multiplanar CT image reconstructions and MIPs were obtained to evaluate the vascular anatomy. CONTRAST:  75mL OMNIPAQUE IOHEXOL 350 MG/ML SOLN COMPARISON:  Plain film from earlier in the same day. FINDINGS: Cardiovascular: Thoracic aorta demonstrates mild atherosclerotic calcifications without aneurysmal dilatation or dissection. Coronary calcifications are noted. Pulmonary artery demonstrates a normal branching pattern without evidence of pulmonary embolism. No cardiac enlargement is seen. No pericardial effusion is seen. Mediastinum/Nodes: Thoracic inlet is within normal limits. Some circumferential thickening of the esophagus is noted which may be related to reflux disease. No significant hilar or mediastinal adenopathy is noted. Lungs/Pleura: Diffuse emphysematous changes are noted. The lungs are well aerated bilaterally with mild bibasilar atelectasis. No focal infiltrate or sizable effusion is seen. No parenchymal nodule is noted. Upper Abdomen: Within normal limits. Musculoskeletal: Mild degenerative change of thoracic spine without acute bony abnormality. Review of the MIP images confirms the above findings. IMPRESSION: No evidence of pulmonary emboli. Mild bibasilar atelectasis. Aortic Atherosclerosis (ICD10-I70.0) and Emphysema (ICD10-J43.9). Electronically Signed   By: Alcide Clever M.D.   On: 12/31/2018 14:52   Dg Chest Port 1 View  Result Date: 12/31/2018 CLINICAL DATA:  Shortness of breath and cough. EXAM: PORTABLE CHEST 1 VIEW COMPARISON:  08/17/2014 FINDINGS: The cardiomediastinal silhouette is within normal limits. Aortic atherosclerosis is noted. Lung volumes are lower than on the prior study, and the interstitial markings are mildly prominent diffusely. No confluent airspace opacity, overt pulmonary edema,  pleural effusion, or pneumothorax is identified. No acute osseous abnormality is seen. IMPRESSION: Decreased lung volumes with mild prominence of the interstitial markings, possibly viral/atypical infection. Electronically Signed   By: Sebastian Ache M.D.   On: 12/31/2018 13:02     ____________________________________________   PROCEDURES  Procedure(s) performed: None Procedures Critical Care performed:  None ____________________________________________   INITIAL IMPRESSION / ASSESSMENT AND PLAN / ED COURSE  58 y.o. male with a history of COPD, CAD status post stent, PAD status post aortobifemoral bypass on Plavix who presents for evaluation of chest tightness, shortness of breath, cough, hemoptysis, subjective fever, sore throat, congestion.  Patient with mild increased work of breathing, no hypoxia, coarse rhonchi bilaterally.  Differential diagnosis including COPD exacerbation versus pneumonia versus flu versus Covid versus PE versus bronchitis. Labs showing leukocytosis, normal troponin, flu negative. CXR concerning for  viral or atypical infection. Will get CT of the chest. Will give albuterol, solumedrol.    _________________________ 3:00 PM on 12/31/2018 -----------------------------------------  Based on results of labs, CT, and lack of fever I have very low suspicion for Covid 19.  CT with no evidence of groundglass infiltrates or pneumonia or PE.  After albuterol and Solu-Medrol patient's breathing is back to normal, he is moving good air with no further episodes of coarse rhonchi or wheezing, normal sats.  Will discharge patient home on prednisone, albuterol, doxycycline for bronchitis.  Discussed return precautions for worsening shortness of breath, fever or chest pain   As part of my medical decision making, I reviewed the following data within the electronic MEDICAL RECORD NUMBER Nursing notes reviewed and incorporated, Labs reviewed , EKG interpreted , Old EKG reviewed, Old chart  reviewed, Radiograph reviewed  Notes from prior ED visits and [place] Controlled Substance Database    Pertinent labs & imaging results that were available during my care of the patient were reviewed by me and considered in my medical decision making (see chart for details).    ____________________________________________   FINAL CLINICAL IMPRESSION(S) / ED DIAGNOSES  Final diagnoses:  Bronchitis      NEW MEDICATIONS STARTED DURING THIS VISIT:  ED Discharge Orders         Ordered    albuterol (PROVENTIL HFA;VENTOLIN HFA) 108 (90 Base) MCG/ACT inhaler  Every 6 hours PRN     12/31/18 1454    predniSONE (DELTASONE) 20 MG tablet  Daily     12/31/18 1454    doxycycline (VIBRAMYCIN) 100 MG capsule  2 times daily     12/31/18 1454           Note:  This document was prepared using Dragon voice recognition software and may include unintentional dictation errors.    Don Perking, [place], MD 12/31/18 [phone]

## 2018-12-31 NOTE — ED Triage Notes (Signed)
Pt states he has URI symptoms that started Friday including sore throat, runny nose, and cough that had progressively worsened in severity and now are causing pt chest pain and shortness of breath. Pt denies travel or known exposure to sick individuals.

## 2018-12-31 NOTE — ED Notes (Signed)
Patient transported to CT 

## 2018-12-31 NOTE — ED Notes (Signed)
When patient tries to swallow he reports central chest pain.

## 2019-01-15 ENCOUNTER — Telehealth: Payer: Self-pay | Admitting: Cardiovascular Disease

## 2019-01-15 NOTE — Telephone Encounter (Signed)
Patient declined Evisit .  Rescheduled to Preferred date in June.

## 2019-01-22 ENCOUNTER — Ambulatory Visit: Payer: Medicaid Other | Admitting: Cardiovascular Disease

## 2019-03-13 ENCOUNTER — Telehealth: Payer: Self-pay

## 2019-03-13 NOTE — Telephone Encounter (Signed)
Left voicemail message requesting for patient to call back to discuss switching 03/20/2019 face to face visit with Dr. Kirke Corin to a telehealth visit however patient previously declined.

## 2019-03-20 ENCOUNTER — Ambulatory Visit: Payer: Medicaid Other | Admitting: Cardiovascular Disease

## 2019-04-27 ENCOUNTER — Other Ambulatory Visit: Payer: Self-pay | Admitting: Cardiovascular Disease

## 2019-05-14 ENCOUNTER — Ambulatory Visit: Payer: Medicaid Other | Admitting: Cardiovascular Disease

## 2019-06-23 ENCOUNTER — Ambulatory Visit (INDEPENDENT_AMBULATORY_CARE_PROVIDER_SITE_OTHER): Payer: Medicaid Other | Admitting: Physician Assistant

## 2019-06-23 ENCOUNTER — Other Ambulatory Visit: Payer: Self-pay

## 2019-06-23 ENCOUNTER — Encounter: Payer: Self-pay | Admitting: Cardiovascular Disease

## 2019-06-23 ENCOUNTER — Other Ambulatory Visit
Admission: RE | Admit: 2019-06-23 | Discharge: 2019-06-23 | Disposition: A | Discharge status: Home / Self Care | Payer: Medicaid Other | Source: Ambulatory Visit | Attending: Physician Assistant | Admitting: Physician Assistant

## 2019-06-23 VITALS — BP 100/78 | HR 87 | Ht 68.0 in | Wt 148.8 lb

## 2019-06-23 DIAGNOSIS — I739 Peripheral vascular disease, unspecified: Secondary | ICD-10-CM

## 2019-06-23 DIAGNOSIS — I25118 Atherosclerotic heart disease of native coronary artery with other forms of angina pectoris: Secondary | ICD-10-CM

## 2019-06-23 DIAGNOSIS — E785 Hyperlipidemia, unspecified: Secondary | ICD-10-CM | POA: Insufficient documentation

## 2019-06-23 DIAGNOSIS — R0600 Dyspnea, unspecified: Secondary | ICD-10-CM

## 2019-06-23 DIAGNOSIS — R0609 Other forms of dyspnea: Secondary | ICD-10-CM

## 2019-06-23 DIAGNOSIS — Z720 Tobacco use: Secondary | ICD-10-CM

## 2019-06-23 LAB — CBC WITH DIFFERENTIAL/PLATELET
Abs Immature Granulocytes: 0.02 10*3/uL (ref 0.00–0.07)
Basophils Absolute: 0.1 10*3/uL (ref 0.0–0.1)
Basophils Relative: 2 %
Eosinophils Absolute: 0.1 10*3/uL (ref 0.0–0.5)
Eosinophils Relative: 2 %
HCT: 40.5 % (ref 39.0–52.0)
Hemoglobin: 14 g/dL (ref 13.0–17.0)
Immature Granulocytes: 0 %
Lymphocytes Relative: 29 %
Lymphs Abs: 2.4 10*3/uL (ref 0.7–4.0)
MCH: 29.7 pg (ref 26.0–34.0)
MCHC: 34.6 g/dL (ref 30.0–36.0)
MCV: 86 fL (ref 80.0–100.0)
Monocytes Absolute: 0.7 10*3/uL (ref 0.1–1.0)
Monocytes Relative: 9 %
Neutro Abs: 4.9 10*3/uL (ref 1.7–7.7)
Neutrophils Relative %: 58 %
Platelets: 241 10*3/uL (ref 150–400)
RBC: 4.71 MIL/uL (ref 4.22–5.81)
RDW: 14 % (ref 11.5–15.5)
WBC: 8.3 10*3/uL (ref 4.0–10.5)
nRBC: 0 % (ref 0.0–0.2)

## 2019-06-23 LAB — LIPID PANEL
Cholesterol: 170 mg/dL (ref 0–200)
HDL: 67 mg/dL
LDL Cholesterol: 80 mg/dL (ref 0–99)
Total CHOL/HDL Ratio: 2.5 ratio
Triglycerides: 113 mg/dL
VLDL: 23 mg/dL (ref 0–40)

## 2019-06-23 LAB — COMPREHENSIVE METABOLIC PANEL WITH GFR
ALT: 25 U/L (ref 0–44)
AST: 33 U/L (ref 15–41)
Albumin: 4.3 g/dL (ref 3.5–5.0)
Alkaline Phosphatase: 69 U/L (ref 38–126)
Anion gap: 13 (ref 5–15)
BUN: 5 mg/dL — ABNORMAL LOW (ref 6–20)
CO2: 20 mmol/L — ABNORMAL LOW (ref 22–32)
Calcium: 8.8 mg/dL — ABNORMAL LOW (ref 8.9–10.3)
Chloride: 97 mmol/L — ABNORMAL LOW (ref 98–111)
Creatinine, Ser: 0.59 mg/dL — ABNORMAL LOW (ref 0.61–1.24)
GFR calc Af Amer: >60 mL/min
GFR calc non Af Amer: >60 mL/min
Glucose, Bld: 90 mg/dL (ref 70–99)
Potassium: 3.7 mmol/L (ref 3.5–5.1)
Sodium: 130 mmol/L — ABNORMAL LOW (ref 135–145)
Total Bilirubin: 0.6 mg/dL (ref 0.3–1.2)
Total Protein: 7.7 g/dL (ref 6.5–8.1)

## 2019-06-23 LAB — LDL CHOLESTEROL, DIRECT: Direct LDL: 87.4 mg/dL (ref 0–99)

## 2019-06-23 NOTE — Progress Notes (Signed)
Cardiology Office Note    Date:  06/23/2019   ID:  [name], [place] 1961/02/10, MRN [id number]  PCP:  Iran Ouch, MD  Cardiologist:  Lorine Bears, MD  Electrophysiologist:  None   Chief Complaint: Follow up  History of Present Illness:   [name] is a 58 y.o. male with history of CAD, PAD status post aortobifemoral bypass in 2015 by Dr. Gilda Crease, COPD secondary to tobacco abuse, and HLD who presents for follow-up of his CAD.  Patient was admitted to the hospital with an anterior lateral ST elevation MI in 08/2017.  Emergent cardiac cath showed subtotal occlusion of a large D1 with moderate proximal LAD disease and moderate RCA disease as outlined below.  The coronary arteries were moderately calcified.  EF was normal.  Patient underwent successful PCI/DES to the D1.  Of note, patient was positive for cocaine at that time.  Following his MI, he briefly quit smoking though did relapse.  He was most recently seen in the office in 07/2018 and was doing reasonably well from a cardiac perspective.  He was noted to have chronic exertional dyspnea that was stable.  He was seen in the ED in 12/2018 with bronchitis.  Troponin negative x 1.  EKG showed sinus rhythm with no acute ischemic changes.   Patient indicates he has not done well for the better part of this year.  He notes daily substernal chest discomfort that is described as "a burning."  He states this feels like reflux and he typically does not get heartburn.  He has tried Tums with minimal relief.  Chest pain is nonexertional.  His chronic dyspnea is stable.  He currently notes a 2 out of 10 substernal chest pain and is speaking in 4-5 word phrases.  He also notes worsening lower extremity claudication, particularly when ambulating up an incline which seems to be new over the past 6 to 9 months as well.  He continues to smoke less than half pack daily and is uninterested in quitting completely.  He is compliant with  all medications including dual antiplatelet therapy.  He denies any falls, BRBPR, or melena.  No lower extremity swelling, abdominal distention, dizziness, presyncope, or syncope.   Labs: 12/2018 - K+ 3.9, SCr 0.67, albumin 4.1, AST/ALT normal, WBC 14.2, HGB 15.2, PLT 280 12/2017 - TC 170, TG 123, HDL 51, LDL 94   Past Medical History:  Diagnosis Date  . COPD (chronic obstructive pulmonary disease) (HCC)   . Coronary artery disease 08/2017   Anterolateral ST elevation myocardial infarction.  Cardiac catheterization showed subtotal occlusion of first diagonal treated successfully with PCI and drug-eluting stent placement.  There was moderate disease affecting the proximal LAD and mid to distal RCA.  Ejection fraction was normal.  . Emphysema lung (HCC)   . Hyperlipidemia   . Medical history non-contributory   . PAD (peripheral artery disease) (HCC)    Status post aortobifemoral bypass in 2015  . Tobacco use     Past Surgical History:  Procedure Laterality Date  . BYPASS GRAFT     in both lower limbs  . CORONARY/GRAFT ACUTE MI REVASCULARIZATION N/A 09/13/2017   Procedure: Coronary/Graft Acute MI Revascularization;  Surgeon: Iran Ouch, MD;  Location: [facility] INVASIVE CV LAB;  Service: Cardiovascular;  Laterality: N/A;  . LEFT HEART CATH AND CORONARY ANGIOGRAPHY N/A 09/13/2017   Procedure: LEFT HEART CATH AND CORONARY ANGIOGRAPHY;  Surgeon: Iran Ouch, MD;  Location: [facility] INVASIVE CV LAB;  Service: Cardiovascular;  Laterality: N/A;    Current Medications: Current Meds  Medication Sig  . albuterol (PROVENTIL HFA;VENTOLIN HFA) 108 (90 Base) MCG/ACT inhaler Inhale 2 puffs into the lungs every 6 (six) hours as needed for wheezing or shortness of breath.  Marland Kitchen aspirin 81 MG chewable tablet Chew 1 tablet (81 mg total) by mouth daily.  Marland Kitchen atorvastatin (LIPITOR) 80 MG tablet TAKE 1 TABLET BY MOUTH EVERY DAY AT 6PM  . clopidogrel (PLAVIX) 75 MG tablet Take 1 tablet (75 mg total) by  mouth daily.  . metoprolol succinate (TOPROL-XL) 25 MG 24 hr tablet Take 1 tablet (25 mg total) by mouth daily.  . nitroGLYCERIN (NITROSTAT) 0.4 MG SL tablet Place 1 tablet (0.4 mg total) under the tongue every 5 (five) minutes as needed for chest pain.    Allergies:   Patient has no known allergies.   Social History   Socioeconomic History  . Marital status: Single    Spouse name: Not on file  . Number of children: Not on file  . Years of education: Not on file  . Highest education level: Not on file  Occupational History  . Not on file  Social Needs  . Financial resource strain: Not on file  . Food insecurity    Worry: Not on file    Inability: Not on file  . Transportation needs    Medical: Not on file    Non-medical: Not on file  Tobacco Use  . Smoking status: Current Every Day Smoker    Packs/day: 0.25    Years: 45.00    Pack years: 11.25    Types: Cigarettes    Last attempt to quit: 09/13/2017    Years since quitting: 1.7  . Smokeless tobacco: Never Used  Substance and Sexual Activity  . Alcohol use: Yes    Alcohol/week: 2.0 standard drinks    Types: 2 Cans of beer per week  . Drug use: No  . Sexual activity: Not on file  Lifestyle  . Physical activity    Days per week: Not on file    Minutes per session: Not on file  . Stress: Not on file  Relationships  . Social Musician on phone: Not on file    Gets together: Not on file    Attends religious service: Not on file    Active member of club or organization: Not on file    Attends meetings of clubs or organizations: Not on file    Relationship status: Not on file  Other Topics Concern  . Not on file  Social History Narrative  . Not on file     Family History:  The patient's family history includes CAD in his father; Heart attack (age of onset: 43) in his father.  ROS:   Review of Systems  Constitutional: Positive for malaise/fatigue. Negative for chills, diaphoresis, fever and weight  loss.  HENT: Negative for congestion.   Eyes: Negative for discharge and redness.  Respiratory: Positive for shortness of breath. Negative for cough, hemoptysis, sputum production and wheezing.   Cardiovascular: Positive for chest pain and claudication. Negative for palpitations, orthopnea, leg swelling and PND.  Gastrointestinal: Positive for heartburn. Negative for abdominal pain, blood in stool, melena, nausea and vomiting.  Genitourinary: Negative for hematuria.  Musculoskeletal: Negative for falls and myalgias.  Skin: Negative for rash.  Neurological: Positive for weakness. Negative for dizziness, tingling, tremors, sensory change, speech change, focal weakness and loss of consciousness.  Endo/Heme/Allergies: Bruises/bleeds  easily.  Psychiatric/Behavioral: Negative for substance abuse. The patient is not nervous/anxious.   All other systems reviewed and are negative.    EKGs/Labs/Other Studies Reviewed:    Studies reviewed were summarized above. The additional studies were reviewed today:  LHC 08/2017:  Prox LAD lesion is 60% stenosed.  Ost 1st Diag to 1st Diag lesion is 99% stenosed.  A drug-eluting stent was successfully placed using a STENT RESOLUTE ONYX 2.5X26.  Post intervention, there is a 100% residual stenosis.  Mid RCA lesion is 45% stenosed.  The left ventricular systolic function is normal.  LV end diastolic pressure is mildly elevated.  There is trivial (1+) mitral regurgitation.  The left ventricular ejection fraction is 55-65% by visual estimate.   1.  Severe one-vessel coronary artery disease with subtotal occlusion of large first diagonal which is the culprit for anterolateral ST elevation myocardial infarction.  There is also moderate proximal LAD disease at the origin of the first diagonal branch and moderate RCA disease. 2.  Normal LV systolic function and mildly elevated left ventricular end-diastolic pressure. 3.  Successful angioplasty and  drug-eluting stent placement to the first diagonal.  Recommendations: Dual antiplatelet therapy for at least one year.  Aggressive treatment of risk factors, smoking cessation and cardiac rehab. __________  2D Echo 09/2017: - Left ventricle: The cavity size was normal. Wall thickness was   normal. Systolic function was normal. The estimated ejection   fraction was in the range of 55% to 60%. Mid anterolateral severe   hypokinesis. Doppler parameters are consistent with abnormal left   ventricular relaxation (grade 1 diastolic dysfunction). - Aortic valve: There was no stenosis. - Mitral valve: There was no regurgitation. - Right ventricle: The cavity size was normal. Systolic function   was normal. - Pulmonary arteries: PA peak pressure: 28 mm Hg (S). - Inferior vena cava: The vessel was normal in size. The   respirophasic diameter changes were in the normal range (>= 50%),   consistent with normal central venous pressure.  Impressions:  - Normal LV size with EF 55-60%. Severe mid anterolateral   hypokinesis. Normal RV size and systolic function. No significant   valvular abnormalities.   EKG:  EKG is ordered today.  The EKG ordered today demonstrates NSR, 87 bpm, anterior T wave inversion disease consistent with prior tracing)  Recent Labs: 12/31/2018: ALT 15; BUN 7; Creatinine, Ser 0.67; Hemoglobin 15.2; Hemoglobin 15.2; Platelets 281; Platelets 280; Potassium 3.9; Sodium 134  Recent Lipid Panel    Component Value Date/Time   CHOL 170 12/20/2017 1124   TRIG 123 12/20/2017 1124   HDL 51 12/20/2017 1124   CHOLHDL 3.3 12/20/2017 1124   CHOLHDL 2.5 09/14/2017 0514   VLDL 18 09/14/2017 0514   LDLCALC 94 12/20/2017 1124    PHYSICAL EXAM:    VS:  BP 100/78 (BP Location: Left Arm, Patient Position: Sitting, Cuff Size: Normal)   Pulse 87   Ht 5\' 8"  (1.727 m)   Wt 148 lb 12 oz (67.5 kg)   BMI 22.62 kg/m   BMI: Body mass index is 22.62 kg/m.  Physical Exam   Constitutional: He is oriented to person, place, and time. He appears well-developed and well-nourished.  HENT:  Head: Normocephalic and atraumatic.  Eyes: Right eye exhibits no discharge. Left eye exhibits no discharge.  Neck: Normal range of motion. No JVD present.  Cardiovascular: Normal rate, regular rhythm, S1 normal, S2 normal and normal heart sounds. Exam reveals no distant heart sounds, no friction rub,  no midsystolic click and no opening snap.  No murmur heard. Pulses:      Dorsalis pedis pulses are 2+ on the right side and 1+ on the left side.       Posterior tibial pulses are 1+ on the right side and 1+ on the left side.  Palpation of the anterior chest wall does not reproduce symptoms  Pulmonary/Chest: Effort normal and breath sounds normal. No respiratory distress. He has no decreased breath sounds. He has no wheezes. He has no rales. He exhibits no tenderness.  Abdominal: Soft. He exhibits no distension. There is no abdominal tenderness.  Musculoskeletal:        General: No edema.  Neurological: He is alert and oriented to person, place, and time.  Skin: Skin is warm and dry. No cyanosis. Nails show no clubbing.  Psychiatric: He has a normal mood and affect. His speech is normal and behavior is normal. Judgment and thought content normal.    Wt Readings from Last 3 Encounters:  06/23/19 148 lb 12 oz (67.5 kg)  12/31/18 145 lb (65.8 kg)  07/22/18 150 lb 12 oz (68.4 kg)     ASSESSMENT & PLAN:   1. CAD involving the native coronary arteries with ongoing angina: Patient currently notes 2 out of 10 chest pain with self-reported chronic stable dyspnea and is speaking in 4-5 word phrases.  I have strongly recommended the patient be transferred from our office directly to the ED for further evaluation including high-sensitivity troponin cycling, CBC, CMP, and follow-up EKG.  Patient adamantly refuses this and is aware of the risks of this decision up to and including potential  death.  I then outlined a plan for the patient to undergo outpatient diagnostic ischemic evaluation via noninvasive or invasive measures which were also refused by the patient at this time.  Again, he is aware of the risks of this decision including death.  Ultimately, the patient has agreed to obtain a CMP, CBC, lipid, and direct LDL today and he will then decide after hearing these results if he would like to proceed with a [place] for diagnostic cardiac cath.  He is unable to treadmill secondary to chronic dyspnea and known PAD with worsening claudication.  For now, he will continue dual antiplatelet therapy with aspirin and Plavix along with continued secondary prevention with atorvastatin and Toprol-XL.  2. PAD: Patient has noted worsening claudication-like symptoms over the past 6 to 9 months, particularly when ambulating up an incline.  He has 1-2+ bilateral dorsalis pedis, posterior tibialis pulses on exam today.  Schedule lower extremity ABIs and aortoiliac ultrasound.  3. Hyperlipidemia: LDL of 94 from 12/2017 with a goal LDL less than 70.  Check liver function, lipid panel, and direct LDL.  Remains on atorvastatin 80 mg daily.  If his LDL remains above goal at today's check recommend adding Zetia 10 mg daily.  4. Chronic dyspnea: Discussed with patient unable to completely exclude cardiac etiology even in the setting of his known pulmonary disease secondary to tobacco abuse.  Recommended ED evaluation as above which was declined.  Recommended outpatient ischemic evaluation as above which was declined.  Patient will let us know when he has made a decision regarding further work-up.  He is aware of the risks of his decision.  5. Tobacco abuse: Complete cessation is strongly recommended.  Disposition: F/u with Dr. Fletcher Anon in 2-3 weeks.   Medication Adjustments/Labs and Tests Ordered: Current medicines are reviewed at length with the patient today.  Concerns regarding medicines are  outlined above. Medication changes, Labs and Tests ordered today are summarized above and listed in the Patient Instructions accessible in Encounters.   Signed, Eula Listenyan Maliya Marich, PA-C 06/23/2019 3:12 PM     [facility] [address] [place], [place] [address] [phone]

## 2019-06-23 NOTE — Patient Instructions (Signed)
Medication Instructions:  Your physician recommends that you continue on your current medications as directed. Please refer to the Current Medication list given to you today.  If you need a refill on your cardiac medications before your next appointment, please call your pharmacy.   Lab work: Health visitor, Lipid, Cbc, Direct LDL  Please have your labs drawn today upstairs at the medical mall  If you have labs (blood work) drawn today and your tests are completely normal, you will receive your results only by: Marland Kitchen MyChart Message (if you have MyChart) OR . A paper copy in the mail If you have any lab test that is abnormal or we need to change your treatment, we will call you to review the results.  Testing/Procedures: Your physician has requested that you have an ankle brachial index (ABI). During this test an ultrasound and blood pressure cuff are used to evaluate the arteries that supply the arms and legs with blood. Allow thirty minutes for this exam. There are no restrictions or special instructions.  Iliac/Aorta duplex    Follow-Up: At [facility], you and your health needs are our priority.  As part of our continuing mission to provide you with exceptional heart care, we have created designated Provider Care Teams.  These Care Teams include your primary Cardiologist (physician) and Advanced Practice Providers (APPs -  Physician Assistants and Nurse Practitioners) who all work together to provide you with the care you need, when you need it.  You will need a follow up appointment in 2-3 weeks with Dr. Fletcher Anon.     Any Other Special Instructions Will Be Listed Below (If Applicable). Please let us know if you decide to proceed with a stress test vs cardiac cath

## 2019-06-24 ENCOUNTER — Telehealth: Payer: Self-pay

## 2019-06-24 MED ORDER — EZETIMIBE 10 MG PO TABS: 10.0000 mg | ORAL_TABLET | Freq: Every day | ORAL | 3 refills | Status: DC

## 2019-06-24 NOTE — Telephone Encounter (Signed)
Call to patient to discuss lab results and POC. Pt verbalized understanding and cooperative w POC>   Zetia Rx sent to pt preferred pharmacy.   Advised pt to call for any further questions or concerns.

## 2019-06-24 NOTE — Telephone Encounter (Signed)
-----   Message from Rise Mu, PA-C sent at 06/23/2019  4:49 PM EDT ----- Sodium level is low, not necessarily a new finding, though lower than prior. Liver function normal Potassium is low normal Renal function is normal Bad cholesterol is above goal Blood count is normal  Recommendations: 1) restrict fluids 2) follow up with pcp this week regarding further testing of low sodium (urine sodium, etc) 3) add Zetia 10 mg daily 4) let us know what he decided regarding stress testing vs cath given symptoms

## 2019-06-26 ENCOUNTER — Telehealth: Payer: Self-pay | Admitting: Cardiovascular Disease

## 2019-06-26 NOTE — Telephone Encounter (Signed)
Recieved request from : scanned Forwarded to [employer] for processing

## 2019-07-17 ENCOUNTER — Other Ambulatory Visit: Payer: Self-pay | Admitting: Physician Assistant

## 2019-07-17 DIAGNOSIS — I739 Peripheral vascular disease, unspecified: Secondary | ICD-10-CM

## 2019-07-21 ENCOUNTER — Ambulatory Visit (INDEPENDENT_AMBULATORY_CARE_PROVIDER_SITE_OTHER): Payer: Medicaid Other

## 2019-07-21 ENCOUNTER — Other Ambulatory Visit: Payer: Self-pay

## 2019-07-21 ENCOUNTER — Encounter

## 2019-07-21 DIAGNOSIS — I739 Peripheral vascular disease, unspecified: Secondary | ICD-10-CM

## 2019-07-22 ENCOUNTER — Other Ambulatory Visit: Payer: Self-pay | Admitting: Cardiovascular Disease

## 2019-07-27 ENCOUNTER — Other Ambulatory Visit: Payer: Self-pay | Admitting: Cardiovascular Disease

## 2019-07-31 ENCOUNTER — Other Ambulatory Visit: Payer: Self-pay

## 2019-07-31 ENCOUNTER — Encounter

## 2019-07-31 ENCOUNTER — Ambulatory Visit (INDEPENDENT_AMBULATORY_CARE_PROVIDER_SITE_OTHER): Payer: Medicaid Other | Admitting: Cardiovascular Disease

## 2019-07-31 ENCOUNTER — Encounter: Payer: Self-pay | Admitting: Cardiovascular Disease

## 2019-07-31 VITALS — BP 100/70 | HR 74 | Ht 67.0 in | Wt 150.5 lb

## 2019-07-31 DIAGNOSIS — Z720 Tobacco use: Secondary | ICD-10-CM

## 2019-07-31 DIAGNOSIS — I739 Peripheral vascular disease, unspecified: Secondary | ICD-10-CM | POA: Diagnosis not present

## 2019-07-31 DIAGNOSIS — E785 Hyperlipidemia, unspecified: Secondary | ICD-10-CM

## 2019-07-31 DIAGNOSIS — I25118 Atherosclerotic heart disease of native coronary artery with other forms of angina pectoris: Secondary | ICD-10-CM | POA: Diagnosis not present

## 2019-07-31 NOTE — Patient Instructions (Signed)
Medication Instructions:  Your physician recommends that you continue on your current medications as directed. Please refer to the Current Medication list given to you today.  *If you need a refill on your cardiac medications before your next appointment, please call your pharmacy*  Lab Work: 1- Your physician recommends that you return for lab work 1 week prior to cath at the medical mall. No appt is needed. Hours are M-F 7AM- 6 PM. Date______ Time_______  2- CV19 Pre admit testing DRIVE THRU  Please report to the PAT testing site (medical arts building) on _________ date ______12:30-2:30PM_____ time for your DRIVE THRU covid testing that is required prior to your procedure.  Following covid testing, please remain in quarantine. If you must be around others, please wash hands, avoid touching face and wear your mask.    If you have labs (blood work) drawn today and your tests are completely normal, you will receive your results only by: Marland Kitchen MyChart Message (if you have MyChart) OR . A paper copy in the mail If you have any lab test that is abnormal or we need to change your treatment, we will call you to review the results.  Testing/Procedures: 1- L heart cath     [place] [address], [place] [address] Dept: [phone] Loc: [place]  07/31/2019  You are scheduled for a L heart cath on _________ date with Dr. Fletcher Anon.   1. Please arrive at the medical mall at [facility]________ (This time is 1 HR before your procedure to ensure your preparation). Free valet parking service is available.   Special note: Every effort is made to have your procedure done on time. Please understand that emergencies sometimes delay scheduled procedures.  2. Diet: Do not eat solid foods after midnight.  The patient may have clear liquids until 5am upon the day of the procedure.  3.  Labs: 1 week prior to cath.   4. Medication instructions in preparation for your procedure: Take all medications as ordered   On the morning of your procedure, take your Aspirin and Plavix and any morning medicines NOT listed above.  You may use sips of water.  5. Plan for one night stay--bring personal belongings. 6. Bring a current list of your medications and current insurance cards. 7. You MUST have a responsible person to drive you home. 8. Someone MUST be with you the first 24 hours after you arrive home or your discharge will be delayed. 9. Please wear clothes that are easy to get on and off and wear slip-on shoes.  Thank you for allowing [place] to care for you!   -- [facility]   Follow-Up: At [facility], you and your health needs are our priority.  As part of our continuing mission to provide you with exceptional heart care, we have created designated Provider Care Teams.  These Care Teams include your primary Cardiologist (physician) and Advanced Practice Providers (APPs -  Physician Assistants and Nurse Practitioners) who all work together to provide you with the care you need, when you need it.  Your next appointment:   5-6 weeks  The format for your next appointment:   In Person  Provider:    You may see Dr. Fletcher Anon or one of the following Advanced Practice Providers on your designated Care Team:    Murray Hodgkins, NP  Christell Faith, PA-C  Marrianne Mood, PA-C

## 2019-07-31 NOTE — Progress Notes (Signed)
Cardiology Office Note   Date:  07/31/2019   ID:  [name], [place] [birth year], MRN [id number]  PCP:  Medicine, [facility]  Cardiologist:   Lorine Bears, MD   Chief Complaint  Patient presents with  . other    Follow up vascular test. Meds reviewed by the pt. verbally. Pt. c/o shortness of breath, chest pain and bilateral leg pain.       History of Present Illness: [name] is a 58 y.o. male who presents for follow-up visit regarding coronary artery disease. He has known history of peripheral arterial disease status post aortobifemoral bypass in 2015 done by Dr. Gilda Crease, COPD, hyperlipidemia and tobacco use.  He had anterolateral ST elevation myocardial infarction in November 2018.  Emergent cardiac catheterization showed subtotal occlusion of large first diagonal with moderate proximal LAD disease and moderate RCA disease.  Coronary arteries were moderately calcified.  Ejection fraction was normal.  I performed successful angioplasty and drug-eluting stent placement to the first diagonal.  Drug screen was positive for cocaine.  He quit smoking after myocardial infarction.  However, he relapsed again after the death of his friend and he is smoking again.  He is now using Chantix and is down to few cigarettes a day.  Over the last few months, he has experienced almost daily chest pain described as substernal tightness mostly at rest lasting for few minutes.  He has significant exertional dyspnea.  He also reports some left leg pain with walking but recent vascular studies showed normal ABI bilaterally with patent aortobifemoral bypass.   Past Medical History:  Diagnosis Date  . COPD (chronic obstructive pulmonary disease) (HCC)   . Coronary artery disease 08/2017   Anterolateral ST elevation myocardial infarction.  Cardiac catheterization showed subtotal occlusion of first diagonal treated successfully with PCI and drug-eluting stent  placement.  There was moderate disease affecting the proximal LAD and mid to distal RCA.  Ejection fraction was normal.  . Emphysema lung (HCC)   . Hyperlipidemia   . Medical history non-contributory   . PAD (peripheral artery disease) (HCC)    Status post aortobifemoral bypass in 2015  . Tobacco use     Past Surgical History:  Procedure Laterality Date  . BYPASS GRAFT     in both lower limbs  . CORONARY/GRAFT ACUTE MI REVASCULARIZATION N/A 09/13/2017   Procedure: Coronary/Graft Acute MI Revascularization;  Surgeon: Iran Ouch, MD;  Location: [facility] INVASIVE CV LAB;  Service: Cardiovascular;  Laterality: N/A;  . LEFT HEART CATH AND CORONARY ANGIOGRAPHY N/A 09/13/2017   Procedure: LEFT HEART CATH AND CORONARY ANGIOGRAPHY;  Surgeon: Iran Ouch, MD;  Location: [facility] INVASIVE CV LAB;  Service: Cardiovascular;  Laterality: N/A;     Current Outpatient Medications  Medication Sig Dispense Refill  . albuterol (PROVENTIL HFA;VENTOLIN HFA) 108 (90 Base) MCG/ACT inhaler Inhale 2 puffs into the lungs every 6 (six) hours as needed for wheezing or shortness of breath. 1 Inhaler 2  . aspirin 81 MG chewable tablet Chew 1 tablet (81 mg total) by mouth daily. 30 tablet 0  . atorvastatin (LIPITOR) 80 MG tablet TAKE 1 TABLET BY MOUTH EVERY DAY AT 6PM 30 tablet 5  . CHANTIX STARTING MONTH PAK 0.5 MG X 11 & 1 MG X 42 tablet Take by mouth 2 (two) times daily.     . clopidogrel (PLAVIX) 75 MG tablet Take 1 tablet by mouth once daily 90 tablet 3  . ezetimibe (ZETIA) 10 MG tablet  Take 1 tablet (10 mg total) by mouth daily. 90 tablet 3  . metoprolol succinate (TOPROL-XL) 25 MG 24 hr tablet TAKE 1 TABLET BY MOUTH ONCE DAILY (TAKE  WITH  OR  IMMEDIATELY  FOLLOWING  A  MEAL) 30 tablet 0  . nitroGLYCERIN (NITROSTAT) 0.4 MG SL tablet Place 1 tablet (0.4 mg total) under the tongue every 5 (five) minutes as needed for chest pain. 25 tablet 3   No current facility-administered medications for this visit.      Allergies:   Patient has no known allergies.    Social History:  The patient  reports that he quit smoking about 22 months ago. His smoking use included cigarettes. He has a 11.25 pack-year smoking history. He has never used smokeless tobacco. He reports current alcohol use of about 2.0 standard drinks of alcohol per week. He reports that he does not use drugs.   Family History:  The patient's family history includes CAD in his father; Heart attack (age of onset: 4052) in his father.    ROS:  Please see the history of present illness.   Otherwise, review of systems are positive for none.   All other systems are reviewed and negative.    PHYSICAL EXAM: VS:  BP 100/70 (BP Location: Left Arm, Patient Position: Sitting, Cuff Size: Normal)   Pulse 74   Ht 5\' 7"  (1.702 m)   Wt 150 lb 8 oz (68.3 kg)   SpO2 97%   BMI 23.57 kg/m  , BMI Body mass index is 23.57 kg/m. GEN: Well nourished, well developed, in no acute distress  HEENT: normal  Neck: no JVD, carotid bruits, or masses Cardiac: RRR; no murmurs, rubs, or gallops,no edema  Respiratory:  clear to auscultation bilaterally, normal work of breathing GI: soft, nontender, nondistended, + BS MS: no deformity or atrophy  Skin: warm and dry, no rash Neuro:  Strength and sensation are intact Psych: euthymic mood, full affect Radial pulses normal  EKG:  EKG is ordered today. The ekg ordered today demonstrates normal sinus rhythm with no significant ST or T wave changes.   Recent Labs: 06/23/2019: ALT 25; BUN 5; Creatinine, Ser 0.59; Hemoglobin 14.0; Platelets 241; Potassium 3.7; Sodium 130    Lipid Panel    Component Value Date/Time   CHOL 170 06/23/2019 1552   CHOL 170 12/20/2017 1124   TRIG 113 06/23/2019 1552   HDL 67 06/23/2019 1552   HDL 51 12/20/2017 1124   CHOLHDL 2.5 06/23/2019 1552   VLDL 23 06/23/2019 1552   LDLCALC 80 06/23/2019 1552   LDLCALC 94 12/20/2017 1124   LDLDIRECT 87.4 06/23/2019 1552      Wt Readings  from Last 3 Encounters:  07/31/19 150 lb 8 oz (68.3 kg)  06/23/19 148 lb 12 oz (67.5 kg)  12/31/18 145 lb (65.8 kg)      No flowsheet data found.    ASSESSMENT AND PLAN:  1.  Coronary artery disease involving native coronary arteries with other forms of angina: The patient symptoms are suggestive of accelerating angina which is almost happening on a daily basis.  I reviewed his most recent cardiac catheterization which showed significant calcifications and moderate disease in the LAD and RCA.  Also the treated diagonal had significant distal disease.  There are a lot of potential areas of ischemia and given his symptoms, I have advised him to undergo urgent cardiac catheterization and possible PCI.  The patient wants to wait few weeks.  I explained to him to  call 911 if his chest pain is not responsive to nitroglycerin.  I discussed the procedure in details as well as risks and benefits.  2.  Hyperlipidemia: Continue high-dose atorvastatin and Zetia.  Most recent lipid profile showed an LDL of 87.  3.  Tobacco use: He cut down significantly with Chantix.  4.  Peripheral arterial disease status post aortobifemoral bypass: Atypical leg pain.  Recent vascular studies showed normal ABI and patent aortobifemoral bypass.   Disposition:   FU with me in 1 month.  Signed,  Kathlyn Sacramento, MD  07/31/2019 4:10 PM    [place]

## 2019-08-03 ENCOUNTER — Telehealth: Payer: Self-pay

## 2019-08-03 NOTE — Telephone Encounter (Signed)
Pt was refusing on Friday to schedule cath when seen at [facility] by Dr. Fletcher Anon.   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   Pt still refusing cath at this time and said it would have to wait until next year or "when they get a covid vaccine".  Pt reports that 2 of his friends have died recently of covid after having routine procedures.   I told him to call back if he changes his mind or if he has new or worsening sx.   Made Dr. Fletcher Anon aware.

## 2019-08-24 ENCOUNTER — Other Ambulatory Visit: Payer: Self-pay | Admitting: Cardiovascular Disease

## 2019-11-03 ENCOUNTER — Other Ambulatory Visit: Payer: Self-pay | Admitting: Cardiovascular Disease

## 2019-11-23 ENCOUNTER — Other Ambulatory Visit: Payer: Self-pay | Admitting: Cardiovascular Disease

## 2019-11-23 NOTE — Telephone Encounter (Signed)
Please schedule overdue F/U with Dr. Arida. Thank you! 

## 2019-11-24 NOTE — Telephone Encounter (Signed)
Patient is scheduled for virtual visit on 2/10

## 2019-11-25 ENCOUNTER — Telehealth (INDEPENDENT_AMBULATORY_CARE_PROVIDER_SITE_OTHER): Payer: Medicaid Other | Admitting: Family

## 2019-11-25 ENCOUNTER — Other Ambulatory Visit: Payer: Self-pay

## 2019-11-25 ENCOUNTER — Encounter: Payer: Self-pay | Admitting: Family

## 2019-11-25 VITALS — Ht 67.0 in | Wt 150.0 lb

## 2019-11-25 DIAGNOSIS — Z720 Tobacco use: Secondary | ICD-10-CM | POA: Diagnosis not present

## 2019-11-25 DIAGNOSIS — I739 Peripheral vascular disease, unspecified: Secondary | ICD-10-CM

## 2019-11-25 DIAGNOSIS — K30 Functional dyspepsia: Secondary | ICD-10-CM

## 2019-11-25 DIAGNOSIS — R0609 Other forms of dyspnea: Secondary | ICD-10-CM | POA: Diagnosis not present

## 2019-11-25 DIAGNOSIS — E785 Hyperlipidemia, unspecified: Secondary | ICD-10-CM

## 2019-11-25 DIAGNOSIS — I25118 Atherosclerotic heart disease of native coronary artery with other forms of angina pectoris: Secondary | ICD-10-CM | POA: Diagnosis not present

## 2019-11-25 NOTE — Progress Notes (Signed)
Virtual Visit via Telephone Note   This visit type was conducted due to national recommendations for restrictions regarding the COVID-19 Pandemic (e.g. social distancing) in an effort to limit this patient's exposure and mitigate transmission in our community.  Due to his co-morbid illnesses, this patient is at least at moderate risk for complications without adequate follow up.  This format is felt to be most appropriate for this patient at this time.  The patient did not have access to video technology/had technical difficulties with video requiring transitioning to audio format only (telephone).  All issues noted in this document were discussed and addressed.  No physical exam could be performed with this format.  Please refer to the patient's chart for his  consent to telehealth for [facility].   Date:  11/25/2019   ID:  [name], [place] [birth year], MRN [id number]  Patient Location: Home Provider Location: Office  PCP:  Medicine, [facility]  Cardiologist:  Lorine Bears, MD  Electrophysiologist:  None   Evaluation Performed:  Follow-Up Visit  Chief Complaint:  Chest pain  History of Present Illness:    [name] is a 59 y.o. male with past medical history of CAD, PAD, COPD, hyperlipidemia, tobacco abuse.  His PAD is s/p aortobifemoral bypass 2015 by Dr. Marena Chancy.  His CAD is s/p anterolateral STEMI in November 2018.  Emergent cardiac catheterization with subtotal occlusion of large first diagonal with moderate proximal LAD disease and moderate RCA disease.  Coronary arteries are moderately calcified.  EF was normal.  He underwent angioplasty and DES to first diagonal.  Drug screen was positive for cocaine.  Last seen 07/31/19 by Dr. Kirke Corin.  At that time he had cut down his smoking significantly on Chantix.  However tells me that Medicaid only pay for 30-day supply of Chantix and he was unable to afford more and as such has returned to smoking.   He rolls his own cigarettes and is not certain how many he smokes per day but tells me he is back to what he was before him.  We discussed smoking cessation.  Recommended he call 1 800 quit now to see if they had additional resources for him.  Reports still having chest pain. Tells me the ezetimibe "knocked down his heartburn". Tells me he is having slight heartburn a few times per week.  We discussed that the acetamide was actually for cholesterol and he was surprised.  Tells me he keeps Tums handy and these work well.  We discussed the possibility of adding a daily PPI such as an over-the-counter omeprazole.  He still declines the cardiac catheterization.  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  Reports he still has chest pain and indigestion but it is difficult to differentiate between these 2 symptoms.  Continues to have significant dyspnea on exertion.  Reports he has not had to take his nitroglycerin.  No blood pressure cuff at home.  Recommended purchasing.  Reports no lightheadedness, dizziness, near syncope.  The patient does not have symptoms concerning for COVID-19 infection (fever, chills, cough, or new shortness of breath).   Past Medical History:  Diagnosis Date  . COPD (chronic obstructive pulmonary disease) (HCC)   . Coronary artery disease 08/2017   Anterolateral ST elevation myocardial infarction.  Cardiac catheterization showed subtotal occlusion of first diagonal treated successfully with PCI and drug-eluting stent  placement.  There was moderate disease affecting the proximal LAD and mid to distal RCA.  Ejection fraction was normal.  . Emphysema lung ([place])   . Hyperlipidemia   . Medical history non-contributory   . PAD (peripheral artery disease) (HCC)    Status post aortobifemoral bypass in 2015  . Tobacco use    Past Surgical History:  Procedure Laterality Date   . BYPASS GRAFT     in both lower limbs  . CORONARY/GRAFT ACUTE MI REVASCULARIZATION N/A 09/13/2017   Procedure: Coronary/Graft Acute MI Revascularization;  Surgeon: Wellington Hampshire, MD;  Location: [facility];  Service: Cardiovascular;  Laterality: N/A;  . LEFT HEART CATH AND CORONARY ANGIOGRAPHY N/A 09/13/2017   Procedure: LEFT HEART CATH AND CORONARY ANGIOGRAPHY;  Surgeon: Wellington Hampshire, MD;  Location: [facility];  Service: Cardiovascular;  Laterality: N/A;    Current Meds  Medication Sig  . albuterol (PROVENTIL HFA;VENTOLIN HFA) 108 (90 Base) MCG/ACT inhaler Inhale 2 puffs into the lungs every 6 (six) hours as needed for wheezing or shortness of breath.  Marland Kitchen aspirin 81 MG chewable tablet Chew 1 tablet (81 mg total) by mouth daily.  Marland Kitchen atorvastatin (LIPITOR) 80 MG tablet TAKE 1 TABLET BY MOUTH EVERY DAY AT 6PM  . clopidogrel (PLAVIX) 75 MG tablet Take 1 tablet by mouth once daily  . ezetimibe (ZETIA) 10 MG tablet Take 1 tablet (10 mg total) by mouth daily.  . nitroGLYCERIN (NITROSTAT) 0.4 MG SL tablet DISSOLVE ONE TABLET UNDER THE TONGUE EVERY FIVE MINUTES AS NEEDED FOR CHEST PAIN    Allergies:   Patient has no known allergies.   Social History   Tobacco Use  . Smoking status: Former Smoker    Packs/day: 0.25    Years: 45.00    Pack years: 11.25    Types: Cigarettes    Quit date: 09/13/2017    Years since quitting: 2.2  . Smokeless tobacco: Never Used  Substance Use Topics  . Alcohol use: Yes    Alcohol/week: 2.0 standard drinks    Types: 2 Cans of beer per week  . Drug use: No     Family Hx: The patient's family history includes CAD in his father; Heart attack (age of onset: 24) in his father.  ROS:   Please see the history of present illness.    Review of Systems  Constitution: Positive for malaise/fatigue. Negative for chills and fever.  Cardiovascular: Positive for chest pain and dyspnea on exertion. Negative for leg swelling, near-syncope,  orthopnea, palpitations and syncope.  Respiratory: Negative for cough, shortness of breath and wheezing.   Gastrointestinal: Positive for heartburn. Negative for nausea and vomiting.  Neurological: Negative for dizziness, light-headedness and weakness.   All other systems reviewed and are negative.   Prior CV studies:   The following studies were reviewed today:  Cath 2018  Prox LAD lesion is 60% stenosed.  Ost 1st Diag to 1st Diag lesion is 99% stenosed.  A drug-eluting stent was successfully placed using a STENT RESOLUTE ONYX 2.5X26.  Post intervention, there is a 100% residual stenosis.  Mid RCA lesion is 45% stenosed.  The left ventricular systolic function is normal.  LV end diastolic pressure is mildly elevated.  There is trivial (1+) mitral regurgitation.  The left ventricular ejection fraction is 55-65% by visual estimate.   1.  Severe one-vessel coronary artery disease with subtotal occlusion of large first diagonal which is the culprit for anterolateral ST elevation myocardial infarction.  There  is also moderate proximal LAD disease at the origin of the first diagonal branch and moderate RCA disease. 2.  Normal LV systolic function and mildly elevated left ventricular end-diastolic pressure. 3.  Successful angioplasty and drug-eluting stent placement to the first diagonal.   Recommendations: Dual antiplatelet therapy for at least one year.  Aggressive treatment of risk factors, smoking cessation and cardiac rehab.  Labs/Other Tests and Data Reviewed:    EKG:  An ECG dated 07/31/19 was personally reviewed today and demonstrated:  SR with no acute ST/T wave changes  Recent Labs: 06/23/2019: ALT 25; BUN 5; Creatinine, Ser 0.59; Hemoglobin 14.0; Platelets 241; Potassium 3.7; Sodium 130   Recent Lipid Panel Lab Results  Component Value Date/Time   CHOL 170 06/23/2019 03:52 PM   CHOL 170 12/20/2017 11:24 AM   TRIG 113 06/23/2019 03:52 PM   HDL 67 06/23/2019 03:52  PM   HDL 51 12/20/2017 11:24 AM   CHOLHDL 2.5 06/23/2019 03:52 PM   LDLCALC 80 06/23/2019 03:52 PM   LDLCALC 94 12/20/2017 11:24 AM   LDLDIRECT 87.4 06/23/2019 03:52 PM    Wt Readings from Last 3 Encounters:  11/25/19 150 lb (68 kg)  07/31/19 150 lb 8 oz (68.3 kg)  06/23/19 148 lb 12 oz (67.5 kg)    Objective:    Vital Signs:  Ht 5\' 7"  (1.702 m)   Wt 150 lb (68 kg) Comment: Estimated by patient.  BMI 23.49 kg/m    VITAL SIGNS:  reviewed  ASSESSMENT & PLAN:    1. CAD with unstable angina -continues to have chest pain-is no longer daily but occurring 3 times per week.  Tells me he has not had to use nitroglycerin.  His cardiac catheterization 2018 showed significant calcifications of moderate disease in LAD and RCA.  He was recommended in October for urgent cardiac catheterization and possible PCI but declined as he was hesitant regarding the coronavirus pandemic.  He continues to decline cardiac catheterization despite long discussion regarding the benefits and attempted reassurance that it is safe in the setting of coronavirus pandemic today.  Continue GDMT aspirin, Plavix, metoprolol, high intensity statin.  In the setting of ischemic symptoms would likely benefit from addition of Imdur however he has no way to monitor his blood pressures at home and his recent office visits have showed low normal blood pressure.  He was encouraged to purchase a blood pressure cuff.  2. HLD -LDL goal less than 70 in the setting of CAD.  Recent LDL 06/2019 of 80, Zetia was subsequently added.  Will require updated lipid panel at his next office visit.  Continue atorvastatin 80 mg daily and Zetia 10 mg daily.  3. Tobacco abuse -was previously able to quit with Chantix but unable to continue cessation without Chantix.  Medicaid only will cover 30-day supply of Chantix per his report.  Smoking cessation encouraged. Recommend utilization of 1800QUITNOW.  4. PAD s/p aortobifemoral bypass - Intermittent  atypical leg pain.  Recent vascular studies with normal ABI and patent aortobifemoral bypass.  5. Indigestion -difficult to differentiate between his indigestion and his chest pain.  Reports taking Tums regularly.  Recommend over-the-counter omeprazole daily.  6. DOE - Likely multifactorial CAD, tobacco abuse, COPD.  COVID-19 Education: The signs and symptoms of COVID-19 were discussed with the patient and how to seek care for testing (follow up with PCP or arrange E-visit).  The importance of social distancing was discussed today.  Time:   Today, I have spent 15 minutes with  the patient with telehealth technology discussing the above problems.    Medication Adjustments/Labs and Tests Ordered: Current medicines are reviewed at length with the patient today.  Concerns regarding medicines are outlined above.   Tests Ordered: No orders of the defined types were placed in this encounter.  Medication Changes: No orders of the defined types were placed in this encounter.  Follow Up:  In Person in 2 month(s) with Dr. Kirke Corin  Signed, Alver Sorrow, NP  11/25/2019 11:38 AM    [facility]

## 2019-11-25 NOTE — Patient Instructions (Signed)
Medication Instructions:  No medication changes today.   If you would like to try something long lasting for your indigestion would recommend over the counter Omeprazole (Prilosec).   *If you need a refill on your cardiac medications before your next appointment, please call your pharmacy*  Lab Work: None ordered today.   Testing/Procedures: None ordered today.  Dr. Kirke Corin recommended a cardiac catheterization in September and I continue to recommend this test. We understand your hesitation due to the coronavirus pandemic. However, strongly recommend you have cardiac catheterization done. When you are vaccinated or if you change your mind, please call our office and we will get you set up promptly.  Follow-Up: At [facility], you and your health needs are our priority.  As part of our continuing mission to provide you with exceptional heart care, we have created designated Provider Care Teams.  These Care Teams include your primary Cardiologist (physician) and Advanced Practice Providers (APPs -  Physician Assistants and Nurse Practitioners) who all work together to provide you with the care you need, when you need it.  Your next appointment:   2 month(s)  The format for your next appointment:   In Person  Provider:   Lorine Bears, MD  Other Instructions  Recommend calling 1-800-QUITNOW to see if they have resources to help you quit smoking. Often they can help with support groups, getting free nicotine patches, or they may be able to help with Chantix.

## 2019-12-30 ENCOUNTER — Telehealth: Payer: Self-pay | Admitting: Cardiovascular Disease

## 2019-12-30 NOTE — Telephone Encounter (Signed)
He will have to be seen again in the office before scheduling cath.

## 2019-12-30 NOTE — Telephone Encounter (Signed)
Spoke with patient and recommended that he give [place] a call once he gets that second shot and then we can get him scheduled for his procedure at that time. He verbalized understanding with no further questions at this time.

## 2019-12-30 NOTE — Telephone Encounter (Signed)
Patient calling in stating that he got his first covid vaccine today, and will get the second dose on 4/7. Patient calling in to schedule his heart cath after that.   Please advise when able

## 2019-12-31 NOTE — Telephone Encounter (Signed)
Scheduled virtual patient did not have time to review consent.   Will fu

## 2020-01-27 NOTE — Progress Notes (Signed)
Virtual Visit via Telephone Note   This visit type was conducted due to national recommendations for restrictions regarding the COVID-19 Pandemic (e.g. social distancing) in an effort to limit this patient's exposure and mitigate transmission in our community.  Due to his co-morbid illnesses, this patient is at least at moderate risk for complications without adequate follow up.  This format is felt to be most appropriate for this patient at this time.  The patient did not have access to video technology/had technical difficulties with video requiring transitioning to audio format only (telephone).  All issues noted in this document were discussed and addressed.  No physical exam could be performed with this format.  Please refer to the patient's chart for his  consent to telehealth for [facility].   The patient was identified using 2 identifiers.  Date:  01/27/2020   ID:  [place], [place] [birth year], MRN [id number]  Patient Location: Home Provider Location: Office  PCP:  Medicine, [facility]  Cardiologist:  Kathlyn Sacramento, MD  Electrophysiologist:  None   Evaluation Performed:  Follow-Up Visit  Chief Complaint:  Chest pain  History of Present Illness:    [name] is a 59 y.o. male with past medical history of CAD, PAD, COPD, hyperlipidemia, tobacco abuse.  His PAD is s/p aortobifemoral bypass 2015 by Dr. Franchot Gallo.  His CAD is s/p anterolateral STEMI in November 2018.  Emergent cardiac catheterization with subtotal occlusion of large first diagonal with moderate proximal LAD disease and moderate RCA disease.  Coronary arteries are moderately calcified.  EF was normal.  He underwent angioplasty and DES to first diagonal.  Drug screen was positive for cocaine.   When seen October 2020 by Dr. Fletcher Anon, he had cut down his smoking significantly on Chantix.  However  Medicaid only paid for 30-day supply of Chantix and he was unable to afford and has  returned to smoking. At that time he was recommended for cardiac catheterization, but declined due to concerns regarding the pandemia.   When seen via telemedicine 11/25/19 continued to decline cardiac catheterization due to concerns regarding the coronavirus pandemic despite continued chest pain.   Reports some numbness and tingling in his left hand after getting his second COVID vaccine. Tells me it is just in his pinky and his finger next to it. No change in color. No change in temperature. We discussed that it was not likely related to the vaccine.   Tells me since starting Prilosec over the counter he has had significant improvement in his heartburn.   Reports he is having chest pain intermittently. It is right in the center of his chest per report. Occurs mostly in the morning. Tells me has not had to take his nitroglycerin. Tells me it lasts a few minutes and sometimes it goes on for a couple hours.   Does not have a blood pressure cuff at home. Encouraged to purchase. Continues to smoke 1 PPD. Not interested in quitting at this time. Previously had good success with Chantix, but was unable to afford.   Has been put on disability. He is now on Medicare. Tells me this paperwork was a long process and he is glad it is finished.   He continues to nearly daily chest pain and dyspnea on exertion. These symptoms are consistent with his previous anginal symptoms and he has known 60% occlusions by cath 2018.  We discussed previous recommendation for cardiac catheterization. Tells me he is "ready to get it done"  but cannot until late May. He and his sister take turns taking 24 hour care of their mom. Tells me he cannot get his catheterization until after May 20th. We discussed that he would require an in-office appointment prior to setting up catheterization.   The patient does not have symptoms concerning for COVID-19 infection (fever, chills, cough, or new shortness of breath).    Past Medical  History:  Diagnosis Date  . COPD (chronic obstructive pulmonary disease) (HCC)   . Coronary artery disease 08/2017   Anterolateral ST elevation myocardial infarction.  Cardiac catheterization showed subtotal occlusion of first diagonal treated successfully with PCI and drug-eluting stent placement.  There was moderate disease affecting the proximal LAD and mid to distal RCA.  Ejection fraction was normal.  . Emphysema lung (HCC)   . Hyperlipidemia   . Medical history non-contributory   . PAD (peripheral artery disease) (HCC)    Status post aortobifemoral bypass in 2015  . Tobacco use    Past Surgical History:  Procedure Laterality Date  . BYPASS GRAFT     in both lower limbs  . CORONARY/GRAFT ACUTE MI REVASCULARIZATION N/A 09/13/2017   Procedure: Coronary/Graft Acute MI Revascularization;  Surgeon: Iran Ouch, MD;  Location: [facility] INVASIVE CV LAB;  Service: Cardiovascular;  Laterality: N/A;  . LEFT HEART CATH AND CORONARY ANGIOGRAPHY N/A 09/13/2017   Procedure: LEFT HEART CATH AND CORONARY ANGIOGRAPHY;  Surgeon: Iran Ouch, MD;  Location: [facility] INVASIVE CV LAB;  Service: Cardiovascular;  Laterality: N/A;     No outpatient medications have been marked as taking for the 01/29/20 encounter (Appointment) with Alver Sorrow, NP.     Allergies:   Patient has no known allergies.   Social History   Tobacco Use  . Smoking status: Former Smoker    Packs/day: 0.25    Years: 45.00    Pack years: 11.25    Types: Cigarettes    Quit date: 09/13/2017    Years since quitting: 2.3  . Smokeless tobacco: Never Used  Substance Use Topics  . Alcohol use: Yes    Alcohol/week: 2.0 standard drinks    Types: 2 Cans of beer per week  . Drug use: No     Family Hx: The patient's family history includes CAD in his father; Heart attack (age of onset: 51) in his father.  ROS:   Please see the history of present illness.    Review of Systems  Constitution: Negative for chills, fever  and malaise/fatigue.  Cardiovascular: Positive for chest pain and dyspnea on exertion. Negative for leg swelling, near-syncope, orthopnea, palpitations and syncope.  Respiratory: Negative for cough, shortness of breath and wheezing.   Gastrointestinal: Negative for nausea and vomiting.  Neurological: Negative for dizziness, light-headedness and weakness.    All other systems reviewed and are negative.   Prior CV studies:   The following studies were reviewed today:  Cardiac cath 08/2017  Prox LAD lesion is 60% stenosed.  Ost 1st Diag to 1st Diag lesion is 99% stenosed.  A drug-eluting stent was successfully placed using a STENT RESOLUTE ONYX 2.5X26.  Post intervention, there is a 100% residual stenosis.  Mid RCA lesion is 45% stenosed.  The left ventricular systolic function is normal.  LV end diastolic pressure is mildly elevated.  There is trivial (1+) mitral regurgitation.  The left ventricular ejection fraction is 55-65% by visual estimate.   1.  Severe one-vessel coronary artery disease with subtotal occlusion of large first diagonal which is the  culprit for anterolateral ST elevation myocardial infarction.  There is also moderate proximal LAD disease at the origin of the first diagonal branch and moderate RCA disease. 2.  Normal LV systolic function and mildly elevated left ventricular end-diastolic pressure. 3.  Successful angioplasty and drug-eluting stent placement to the first diagonal.   Recommendations: Dual antiplatelet therapy for at least one year.  Aggressive treatment of risk factors, smoking cessation and cardiac rehab.   Echo 09/2017 Left ventricle: The cavity size was normal. Wall thickness was    normal. Systolic function was normal. The estimated ejection    fraction was in the range of 55% to 60%. Mid anterolateral severe    hypokinesis. Doppler parameters are consistent with abnormal left    ventricular relaxation (grade 1 diastolic dysfunction).   - Aortic valve: There was no stenosis.  - Mitral valve: There was no regurgitation.  - Right ventricle: The cavity size was normal. Systolic function    was normal.  - Pulmonary arteries: PA peak pressure: 28 mm Hg (S).  - Inferior vena cava: The vessel was normal in size. The    respirophasic diameter changes were in the normal range (>= 50%),    consistent with normal central venous pressure.   Impressions:   - Normal LV size with EF 55-60%. Severe mid anterolateral    hypokinesis. Normal RV size and systolic function. No significant    valvular abnormalities.   Labs/Other Tests and Data Reviewed:    EKG:  No ECG reviewed.  Recent Labs: 06/23/2019: ALT 25; BUN 5; Creatinine, Ser 0.59; Hemoglobin 14.0; Platelets 241; Potassium 3.7; Sodium 130   Recent Lipid Panel Lab Results  Component Value Date/Time   CHOL 170 06/23/2019 03:52 PM   CHOL 170 12/20/2017 11:24 AM   TRIG 113 06/23/2019 03:52 PM   HDL 67 06/23/2019 03:52 PM   HDL 51 12/20/2017 11:24 AM   CHOLHDL 2.5 06/23/2019 03:52 PM   LDLCALC 80 06/23/2019 03:52 PM   LDLCALC 94 12/20/2017 11:24 AM   LDLDIRECT 87.4 06/23/2019 03:52 PM    Wt Readings from Last 3 Encounters:  11/25/19 150 lb (68 kg)  07/31/19 150 lb 8 oz (68.3 kg)  06/23/19 148 lb 12 oz (67.5 kg)     Objective:    Vital Signs:  There were no vitals taken for this visit.   VITAL SIGNS:  reviewed  ASSESSMENT & PLAN:    1. CAD with unstable angina - Continues to have chest pain and dysnea multiple times per week. Lasts minutes to hours. Has not had to use nitroglycerin, but does have available. Cardiac cath 2018 with significant calcifications of moderate disease in LAD and RCA. Recommended in October by Dr. Kirke Corin for urgent cardiac catheterization with recommendation reiterated at 11/2019 virtual visit. Considered addition of Imdur, however no mechanism to check BP at home and declines in office visit.  Tells me he is "ready to have a cath" now that he has  had both COVID vaccines, however declines to schedule until after May 20th due to helping care for his mother with dementia. Reiterated recommendation to proceed with cardiac catheterization as soon as possible due to unstable anginal symptoms.   Continue GDMT aspirin, Plavix, Metoprolol, Lipitor 80mg , PRN nitroglycerin.  Encouraged to proceed to ED for chest pain unrelieved by nitroglycerin.   Follow up May 21st in office for pre-cath visit. Will require labs, chest xray, EKG. Will plan to schedule for catheterization at that time, if appropriate.   2. HLD -  Lipid panel 06/2019 with LDL 80. Zetia 10mg  was subsequently added. Continue Lipitor 80mg  daily. Recommend repeat panel at upcoming office visit.   3. Tobacco abuse - Smoking cessation encouraged. Recommend utilization of 1800QUITNOW.  4. PAD s/p aortobifemoral bypass - Reports no worsening claudication symptoms. Continue aspirin, plavix, statin, zetia.   5. Indigestion - Improved since addition of as needed Omeprazole. Recommend avoid spicy, greasy, acidic foods. Tells me he can differentiate between indigestion and chest pain.   6. DOE - Likely multifactorial tobacco abuse, CAD, deconditioning. Plan for cardiac catheterization, as above.   COVID-19 Education: The signs and symptoms of COVID-19 were discussed with the patient and how to seek care for testing (follow up with PCP or arrange E-visit).  The importance of social distancing was discussed today.  Time:   Today, I have spent 22 minutes with the patient with telehealth technology discussing the above problems.     Medication Adjustments/Labs and Tests Ordered: Current medicines are reviewed at length with the patient today.  Concerns regarding medicines are outlined above.   Tests Ordered: No orders of the defined types were placed in this encounter.   Medication Changes: No orders of the defined types were placed in this encounter.   Follow Up: Follow up  In Person  in 4 week(s)  Signed, , NP  01/27/2020 11:09 AM    [facility]

## 2020-01-29 ENCOUNTER — Telehealth: Payer: Self-pay

## 2020-01-29 ENCOUNTER — Telehealth (INDEPENDENT_AMBULATORY_CARE_PROVIDER_SITE_OTHER): Payer: Medicaid Other | Admitting: Family

## 2020-01-29 ENCOUNTER — Other Ambulatory Visit: Payer: Self-pay

## 2020-01-29 ENCOUNTER — Encounter: Payer: Self-pay | Admitting: Family

## 2020-01-29 VITALS — Ht 67.0 in | Wt 155.0 lb

## 2020-01-29 DIAGNOSIS — Z720 Tobacco use: Secondary | ICD-10-CM

## 2020-01-29 DIAGNOSIS — E785 Hyperlipidemia, unspecified: Secondary | ICD-10-CM

## 2020-01-29 DIAGNOSIS — R0609 Other forms of dyspnea: Secondary | ICD-10-CM | POA: Diagnosis not present

## 2020-01-29 DIAGNOSIS — I25110 Atherosclerotic heart disease of native coronary artery with unstable angina pectoris: Secondary | ICD-10-CM | POA: Diagnosis not present

## 2020-01-29 NOTE — Telephone Encounter (Signed)
  Patient Consent for Virtual Visit         [name] has provided verbal consent on 01/29/2020 for a virtual visit (video or telephone).   CONSENT FOR VIRTUAL VISIT FOR:  [name]  By participating in this virtual visit I agree to the following:  I hereby voluntarily request, consent and authorize [facility] and its employed or contracted physicians, physician assistants, nurse practitioners or other licensed health care professionals (the Practitioner), to provide me with telemedicine health care services (the "Services") as deemed necessary by the treating Practitioner. I acknowledge and consent to receive the Services by the Practitioner via telemedicine. I understand that the telemedicine visit will involve communicating with the Practitioner through live audiovisual communication technology and the disclosure of certain medical information by electronic transmission. I acknowledge that I have been given the opportunity to request an in-person assessment or other available alternative prior to the telemedicine visit and am voluntarily participating in the telemedicine visit.  I understand that I have the right to withhold or withdraw my consent to the use of telemedicine in the course of my care at any time, without affecting my right to future care or treatment, and that the Practitioner or I may terminate the telemedicine visit at any time. I understand that I have the right to inspect all information obtained and/or recorded in the course of the telemedicine visit and may receive copies of available information for a reasonable fee.  I understand that some of the potential risks of receiving the Services via telemedicine include:  Marland Kitchen Delay or interruption in medical evaluation due to technological equipment failure or disruption; . Information transmitted may not be sufficient (e.g. poor resolution of images) to allow for appropriate medical decision making by the  Practitioner; and/or  . In rare instances, security protocols could fail, causing a breach of personal health information.  Furthermore, I acknowledge that it is my responsibility to provide information about my medical history, conditions and care that is complete and accurate to the best of my ability. I acknowledge that Practitioner's advice, recommendations, and/or decision may be based on factors not within their control, such as incomplete or inaccurate data provided by me or distortions of diagnostic images or specimens that may result from electronic transmissions. I understand that the practice of medicine is not an exact science and that Practitioner makes no warranties or guarantees regarding treatment outcomes. I acknowledge that a copy of this consent can be made available to me via my patient portal [facility] MyChart), or I can request a printed copy by calling the office of [facility].    I understand that my insurance will be billed for this visit.   I have read or had this consent read to me. . I understand the contents of this consent, which adequately explains the benefits and risks of the Services being provided via telemedicine.  . I have been provided ample opportunity to ask questions regarding this consent and the Services and have had my questions answered to my satisfaction. . I give my informed consent for the services to be provided through the use of telemedicine in my medical care

## 2020-01-29 NOTE — Patient Instructions (Signed)
Medication Instructions:  No medication changes today.   *If you need a refill on your cardiac medications before your next appointment, please call your pharmacy*   Lab Work: No lab work today. We will plan to do labs at your next appointment. Please come fasting to this appointment.   If you have labs (blood work) drawn today and your tests are completely normal, you will receive your results only by: Marland Kitchen MyChart Message (if you have MyChart) OR . A paper copy in the mail If you have any lab test that is abnormal or we need to change your treatment, we will call you to review the results.   Testing/Procedures: None ordered today. We will do an EKG and likely chest x-ray    Follow-Up: At [facility], you and your health needs are our priority.  As part of our continuing mission to provide you with exceptional heart care, we have created designated Provider Care Teams.  These Care Teams include your primary Cardiologist (physician) and Advanced Practice Providers (APPs -  Physician Assistants and Nurse Practitioners) who all work together to provide you with the care you need, when you need it.  We recommend signing up for the patient portal called "MyChart".  Sign up information is provided on this After Visit Summary.  MyChart is used to connect with patients for Virtual Visits (Telemedicine).  Patients are able to view lab/test results, encounter notes, upcoming appointments, etc.  Non-urgent messages can be sent to your provider as well.   To learn more about what you can do with MyChart, go to [email or web address].    Your next appointment:   May 21st at 9 am   Other Instructions   Recommend continuing Prilosec (omeprazole) as needed for indigestion.   Recommend using Nitroglycerin as needed for chest pain. Place one tablet under tongue, wait 5 minutes. If chest pain does not resolve, take a 2nd tablet. If chest pain does not resolve after an additional 5 minutes, take a  3rd tablet and call 911.    It is very important you come to the office for your next visit. We will need to get labs and do some testing (such as EKG) prior to your catheterization. We will schedule the cardiac catheterization at your upcoming appointment.

## 2020-02-05 ENCOUNTER — Telehealth: Payer: Medicaid Other | Admitting: Cardiovascular Disease

## 2020-02-22 ENCOUNTER — Other Ambulatory Visit: Payer: Self-pay | Admitting: Cardiovascular Disease

## 2020-02-22 MED ORDER — METOPROLOL SUCCINATE ER 25 MG PO TB24: ORAL_TABLET | ORAL | 0 refills | Status: DC

## 2020-02-22 NOTE — Telephone Encounter (Signed)
*  STAT* If patient is at the pharmacy, call can be transferred to refill team.   1. Which medications need to be refilled? (please list name of each medication and dose if known) metoprolol 25 mg  2. Which pharmacy/location (including street and city if local pharmacy) is medication to be sent to? [address] [facility]  3. Do they need a 30 day or 90 day supply? 90  Will be out on Wednesday and is scheduled for 5/21

## 2020-02-22 NOTE — Telephone Encounter (Signed)
Requested Prescriptions   Signed Prescriptions Disp Refills  . metoprolol succinate (TOPROL-XL) 25 MG 24 hr tablet 90 tablet 0    Sig: TAKE 1 TABLET BY MOUTH ONCE DAILY (TAKE  WITH  OR  IMMEDIATELY  FOLLOWING  A  MEAL)    Authorizing Provider: Lorine Bears A    Ordering User: Kendrick Fries

## 2020-03-04 ENCOUNTER — Encounter: Payer: Self-pay | Admitting: Physician Assistant

## 2020-03-04 ENCOUNTER — Ambulatory Visit (INDEPENDENT_AMBULATORY_CARE_PROVIDER_SITE_OTHER): Payer: Medicare Other | Admitting: Physician Assistant

## 2020-03-04 ENCOUNTER — Other Ambulatory Visit: Payer: Self-pay

## 2020-03-04 ENCOUNTER — Other Ambulatory Visit
Admission: RE | Admit: 2020-03-04 | Discharge: 2020-03-04 | Disposition: A | Discharge status: Home / Self Care | Payer: Medicare Other | Source: Ambulatory Visit | Attending: Cardiovascular Disease | Admitting: Cardiovascular Disease

## 2020-03-04 ENCOUNTER — Ambulatory Visit
Admission: RE | Admit: 2020-03-04 | Discharge: 2020-03-04 | Disposition: A | Discharge status: Home / Self Care | Payer: Medicare Other | Attending: Physician Assistant | Admitting: Physician Assistant

## 2020-03-04 ENCOUNTER — Ambulatory Visit
Admission: RE | Admit: 2020-03-04 | Discharge: 2020-03-04 | Disposition: A | Discharge status: Home / Self Care | Payer: Medicare Other | Source: Ambulatory Visit | Attending: Physician Assistant | Admitting: Physician Assistant

## 2020-03-04 ENCOUNTER — Other Ambulatory Visit: Payer: Self-pay | Admitting: Physician Assistant

## 2020-03-04 VITALS — BP 120/90 | HR 75 | Ht 67.0 in | Wt 154.1 lb

## 2020-03-04 DIAGNOSIS — Z720 Tobacco use: Secondary | ICD-10-CM | POA: Diagnosis not present

## 2020-03-04 DIAGNOSIS — J439 Emphysema, unspecified: Secondary | ICD-10-CM | POA: Diagnosis not present

## 2020-03-04 DIAGNOSIS — I25110 Atherosclerotic heart disease of native coronary artery with unstable angina pectoris: Secondary | ICD-10-CM

## 2020-03-04 DIAGNOSIS — E785 Hyperlipidemia, unspecified: Secondary | ICD-10-CM | POA: Diagnosis not present

## 2020-03-04 DIAGNOSIS — I739 Peripheral vascular disease, unspecified: Secondary | ICD-10-CM

## 2020-03-04 DIAGNOSIS — R0609 Other forms of dyspnea: Secondary | ICD-10-CM

## 2020-03-04 DIAGNOSIS — Z20822 Contact with and (suspected) exposure to covid-19: Secondary | ICD-10-CM | POA: Insufficient documentation

## 2020-03-04 DIAGNOSIS — J449 Chronic obstructive pulmonary disease, unspecified: Secondary | ICD-10-CM

## 2020-03-04 DIAGNOSIS — T148XXA Other injury of unspecified body region, initial encounter: Secondary | ICD-10-CM

## 2020-03-04 DIAGNOSIS — I252 Old myocardial infarction: Secondary | ICD-10-CM

## 2020-03-04 DIAGNOSIS — K30 Functional dyspepsia: Secondary | ICD-10-CM

## 2020-03-04 MED ORDER — SODIUM CHLORIDE 0.9% FLUSH
3.0000 mL | Freq: Two times a day (BID) | INTRAVENOUS | Status: DC
Start: 2020-03-04 — End: 2020-03-04

## 2020-03-04 NOTE — Patient Instructions (Signed)
Medication Instructions:  No changes at this time.  *If you need a refill on your cardiac medications before your next appointment, please call your pharmacy*   Lab Work: CBC, CMET, Lipid, Direct LDL  If you have labs (blood work) drawn today and your tests are completely normal, you will receive your results only by: Marland Kitchen MyChart Message (if you have MyChart) OR . A paper copy in the mail If you have any lab test that is abnormal or we need to change your treatment, we will call you to review the results.   Testing/Procedures: 1. Go to [facility] for chest Xray as you leave today.  2. Once done then get in car and go to drive thru testing site at [facility] entrance to have your COVID swab done today. Then go home and quarantine until procedure on Monday.   [facility] Cardiac Cath Instructions   You are scheduled for a Cardiac Cath on: Monday 5/24  Please arrive at 08:30 am on the day of your procedure  Please expect a call from our [facility] to pre-register you  Do not eat/drink anything after midnight  Someone will need to drive you home  It is recommended someone be with you for the first 24 hours after your procedure  Wear clothes that are easy to get on/off and wear slip on shoes if possible   Medications bring a current list of all medications with you  _XX__ You may take all of your medications the morning of your procedure with enough water to swallow safely   Day of your procedure: [facility] entrance.  Free valet service is available.  After entering [facility] please check-in at the registration desk (1st desk on your right) to receive your armband. After receiving your armband someone will escort you to the cardiac cath/special procedures waiting area.  The usual length of stay after your procedure is about 2 to 3 hours.  This can vary.  If you have any questions, please call our office at [phone], or you  may call the cardiac cath lab at [facility] directly at [phone]   Follow-Up: At [facility], you and your health needs are our priority.  As part of our continuing mission to provide you with exceptional heart care, we have created designated Provider Care Teams.  These Care Teams include your primary Cardiologist (physician) and Advanced Practice Providers (APPs -  Physician Assistants and Nurse Practitioners) who all work together to provide you with the care you need, when you need it.  We recommend signing up for the patient portal called "MyChart".  Sign up information is provided on this After Visit Summary.  MyChart is used to connect with patients for Virtual Visits (Telemedicine).  Patients are able to view lab/test results, encounter notes, upcoming appointments, etc.  Non-urgent messages can be sent to your provider as well.   To learn more about what you can do with MyChart, go to [email or web address].    Your next appointment:   After your procedure  The format for your next appointment:   In Person  Provider:    You may see Lorine Bears, MD or one of the following Advanced Practice Providers on your designated Care Team:    Nicolasa Ducking, NP  Eula Listen, PA-C  Marisue Ivan, PA-C

## 2020-03-04 NOTE — H&P (View-Only) (Signed)
Office Visit    Patient Name: Barre Aydelott Date of Encounter: 03/04/2020  Primary Care Provider:  Medicine, [facility] Cardiologist:  Lorine Bears, MD  Chief Complaint    Chief Complaint  Patient presents with  . OTHER    1 month f/u CAD c/o bruising all around body/pain. Meds reviewed verbally with pt.    59 year old male with PMH of CAD, PAD, COPD, hyperlipidemia, and current tobacco use (1 pk/day), and seen today for in person 1 month follow-up and given previous recommendations for LHC.  Past Medical History    Past Medical History:  Diagnosis Date  . COPD (chronic obstructive pulmonary disease) (HCC)   . Coronary artery disease 08/2017   Anterolateral ST elevation myocardial infarction.  Cardiac catheterization showed subtotal occlusion of first diagonal treated successfully with PCI and drug-eluting stent placement.  There was moderate disease affecting the proximal LAD and mid to distal RCA.  Ejection fraction was normal.  . Emphysema lung (HCC)   . Hyperlipidemia   . Medical history non-contributory   . PAD (peripheral artery disease) (HCC)    Status post aortobifemoral bypass in 2015  . Tobacco use    Past Surgical History:  Procedure Laterality Date  . BYPASS GRAFT     in both lower limbs  . CORONARY/GRAFT ACUTE MI REVASCULARIZATION N/A 09/13/2017   Procedure: Coronary/Graft Acute MI Revascularization;  Surgeon: Iran Ouch, MD;  Location: [facility] INVASIVE CV LAB;  Service: Cardiovascular;  Laterality: N/A;  . LEFT HEART CATH AND CORONARY ANGIOGRAPHY N/A 09/13/2017   Procedure: LEFT HEART CATH AND CORONARY ANGIOGRAPHY;  Surgeon: Iran Ouch, MD;  Location: [facility] INVASIVE CV LAB;  Service: Cardiovascular;  Laterality: N/A;    Allergies  No Known Allergies  History of Present Illness    [name] is a 59 y.o. male with PMH as above.  He was last seen in the office 01/29/2020.  He has a history of PAD  s/p arterial bifemoral bypass in 2015 by Dr. Gilda Crease.  His CAD is s/p anterior lateral STEMI in November 2018.  Emergent cardiac catheterization with subtotal occlusion of large first diagonal with moderate pLAD disease and moderate RCA disease.  Coronary arteries are moderately calcified.  EF was normal.  He underwent angioplasty and DES to first diagonal.  Drug screen was positive for cocaine.  He was seen 07/2019 and had cut down on his smoking with Chantix.  Medicaid only paid for 30-day supply of Chantix, and he was unable to afford this so had return to smoking.  It was recommended he undergo cardiac catheterization at that time, but he declined due to concerns regarding the COVID-19 pandemic.  He was seen via telemedicine 11/25/2019 and continued to decline cardiac catheterization due to concerns regarding COVID-19.  He continued to report chest pain.  He was seen 01/29/2020 via telemedicine and reported constant numbness and tingling in his left pink and ring finger after getting his second COVID-19 vaccine.  No reported changes in color or temperature.  He was advised that this was likely not related to the vaccine.  He also reported chest pain that had improved after starting Prilosec over-the-counter.  The chest pain was described as intermittent in frequency and in the center of his chest, occurring mostly in the morning, and with duration varying from a few minutes to a couple of hours. He had not taken his sublingual nitro. It was further described as consistent with his previous anginal symptoms  with known 60% occlusion by cath in 2018. Cardiac catheterization was recommended but unable to be performed until late May per his schedule. He was smoking 1 pack/day and not interested in quitting smoking at that time.  He did not have a blood pressure cuff at home and was encouraged to purchase one.  Recommendation was for in office evaluation for pre-cath visit, as well as obtain labs, CXR, and EKG  before LHC.  Today, he returns to clinic and reports ongoing symptoms as outlined above.  He continues to experience chest pain that is worse in the morning and located in the center of his chest, lasting anywhere from minutes to hours and then dissipating on its own.  He has not used any sublingual nitro. He notices the CP more when at rest but also notes it occurs with activity and no clear triggers. He also notes pain 2/2 recent bruising that has started since initiation of Plavix.   He specifically notes an increase in bruising following working on his motorcycle.  He denies any signs or symptoms of bleeding, including BRBPR, hematuria, hematochezia, and melena.  He notes stable breathing without SOB at rest and stable DOE. He reports recent sinus infection.  He currently uses his albuterol 2 times per month and reports this always alleviates any breathing issues / DOE that he may have at that time.  He denies any orthopnea, lower extremity edema, abdominal distention, or early satiety. He denies racing HR or palpitations. He does note sx consistent with amaurosis fugax but only if shaking his head vigorously. He continues to note finger numbness in his left pinky and ring finger s/p COVID-19 vaccination and with recommendations as below.   He notes frustration that he had quit smoking for 1 month until unable to afford Plavix.  Due to family stressors, he restarted smoking 1 month later and currently smoking 1 pack/day.  Possible smoking cessation methods were discussed as below.  He reports approximately 2 beers per day.  No illegal drug use.  He confirms medication compliance.  He states today that he is eager to schedule his cardiac catheterization. He reports that he is still working on [employer], and that his lawyer is currently attempting to clarify his current insurance.     Home Medications    Prior to Admission medications   Medication Sig Start Date End Date Taking? Authorizing  Provider  albuterol (PROVENTIL HFA;VENTOLIN HFA) 108 (90 Base) MCG/ACT inhaler Inhale 2 puffs into the lungs every 6 (six) hours as needed for wheezing or shortness of breath. 12/31/18   Nita Sickle, MD  aspirin 81 MG chewable tablet Chew 1 tablet (81 mg total) by mouth daily. 09/16/17   Altamese Dilling, MD  atorvastatin (LIPITOR) 80 MG tablet TAKE 1 TABLET BY MOUTH EVERY DAY AT 6PM 07/22/18   Iran Ouch, MD  clopidogrel (PLAVIX) 75 MG tablet Take 1 tablet by mouth once daily 07/27/19   Iran Ouch, MD  ezetimibe (ZETIA) 10 MG tablet Take 1 tablet (10 mg total) by mouth daily. 06/24/19 11/24/28  Dunn, Raymon Mutton, PA-C  metoprolol succinate (TOPROL-XL) 25 MG 24 hr tablet TAKE 1 TABLET BY MOUTH ONCE DAILY (TAKE  WITH  OR  IMMEDIATELY  FOLLOWING  A  MEAL) 02/22/20   Iran Ouch, MD  nitroGLYCERIN (NITROSTAT) 0.4 MG SL tablet DISSOLVE ONE TABLET UNDER THE TONGUE EVERY FIVE MINUTES AS NEEDED FOR CHEST PAIN 11/03/19   Iran Ouch, MD    Review of Systems  He denies palpitations, SOB at rest, pnd, orthopnea, n, v, dizziness, syncope, edema, weight gain, or early satiety.  He reports ongoing chest pain, recent increase in bruising attributed to Plavix, stable DOE (alleviated with inhaler), and spots in his vision that occur with vigorous shaking of his head.   All other systems reviewed and are otherwise negative except as noted above.  Physical Exam    VS:  BP 120/90 (BP Location: Left Arm, Patient Position: Sitting, Cuff Size: Normal)   Pulse 75   Ht 5\' 7"  (1.702 m)   Wt 154 lb 2 oz (69.9 kg)   SpO2 98%   BMI 24.14 kg/m  , BMI Body mass index is 24.14 kg/m. GEN: Well nourished, well developed, in no acute distress. Mask in place. HEENT: normal. Neck: Supple, no JVD, carotid bruits, or masses. Cardiac: RRR, no murmurs, rubs, or gallops. No clubbing, cyanosis, edema.  Radials/DP/PT 2+ and equal bilaterally.  Respiratory:  Coarse breath sounds bilaterally. GI: Soft,  nontender, nondistended, BS + x 4. MS: no deformity or atrophy.  Skin: warm and dry, no rash.  Bruising noted on right side of chest and along left upper extremity. Neuro:  Strength and sensation are intact. Psych: Normal affect.  Accessory Clinical Findings    ECG personally reviewed by me today - NSR, 75bpm, Qtc 402ms, possible lead placement change in V1, no acute change from previous - no acute changes.  VITALS Reviewed today   Temp Readings from Last 3 Encounters:  12/31/18 98.2 F (36.8 C) (Oral)  09/15/17 97.9 F (36.6 C) (Oral)   BP Readings from Last 3 Encounters:  03/04/20 120/90  07/31/19 100/70  06/23/19 100/78   Pulse Readings from Last 3 Encounters:  03/04/20 75  07/31/19 74  06/23/19 87    Wt Readings from Last 3 Encounters:  03/04/20 154 lb 2 oz (69.9 kg)  01/29/20 155 lb (70.3 kg)  11/25/19 150 lb (68 kg)     LABS  reviewed today   CareEverwhere Labs present and most recent? Yes/No: No  Lab Results  Component Value Date   WBC 8.3 06/23/2019   HGB 14.0 06/23/2019   HCT 40.5 06/23/2019   MCV 86.0 06/23/2019   PLT 241 06/23/2019   Lab Results  Component Value Date   CREATININE 0.59 (L) 06/23/2019   BUN 5 (L) 06/23/2019   NA 130 (L) 06/23/2019   K 3.7 06/23/2019   CL 97 (L) 06/23/2019   CO2 20 (L) 06/23/2019   Lab Results  Component Value Date   ALT 25 06/23/2019   AST 33 06/23/2019   ALKPHOS 69 06/23/2019   BILITOT 0.6 06/23/2019   Lab Results  Component Value Date   CHOL 170 06/23/2019   HDL 67 06/23/2019   LDLCALC 80 06/23/2019   LDLDIRECT 87.4 06/23/2019   TRIG 113 06/23/2019   CHOLHDL 2.5 06/23/2019    Lab Results  Component Value Date   HGBA1C 5.5 09/13/2017   No results found for: TSH   STUDIES/PROCEDURES reviewed today   Cardiac cath 08/2017  Prox LAD lesion is 60% stenosed.  Ost 1st Diag to 1st Diag lesion is 99% stenosed.  A drug-eluting stent was successfully placed using a STENT RESOLUTE ONYX 2.5X26.  Post  intervention, there is a 100% residual stenosis.  Mid RCA lesion is 45% stenosed.  The left ventricular systolic function is normal.  LV end diastolic pressure is mildly elevated.  There is trivial (1+) mitral regurgitation.  The left ventricular ejection fraction  is 55-65% by visual estimate. 1. Severe one-vessel coronary artery disease with subtotal occlusion of large first diagonal which is the culprit for anterolateral ST elevation myocardial infarction. There is also moderate proximal LAD disease at the origin of the first diagonal branch and moderate RCA disease. 2. Normal LV systolic function and mildly elevated left ventricular end-diastolic pressure. 3. Successful angioplasty and drug-eluting stent placement to the first diagonal. Recommendations: Dual antiplatelet therapy for at least one year. Aggressive treatment of risk factors, smoking cessation and cardiac rehab.  Echo 09/2017 Left ventricle: The cavity size was normal. Wall thickness was  normal. Systolic function was normal. The estimated ejection  fraction was in the range of 55% to 60%. Mid anterolateral severe  hypokinesis. Doppler parameters are consistent with abnormal left  ventricular relaxation (grade 1 diastolic dysfunction).  - Aortic valve: There was no stenosis.  - Mitral valve: There was no regurgitation.  - Right ventricle: The cavity size was normal. Systolic function  was normal.  - Pulmonary arteries: PA peak pressure: 28 mm Hg (S).  - Inferior vena cava: The vessel was normal in size. The  respirophasic diameter changes were in the normal range (>= 50%),  consistent with normal central venous pressure.  Impressions:  - Normal LV size with EF 55-60%. Severe mid anterolateral  hypokinesis. Normal RV size and systolic function. No significant  valvular abnormalities.   07/2020 Vas US aorta Abdominal Aorta: No evidence of an abdominal aortic aneurysm was  visualized. The  largest aortic measurement is 2.0 cm.   Assessment & Plan    CAD with unstable angina --CP reported with both exertion and at rest, lasting minutes to hours. Notes DOE, though states albuterol completely alleviates any breathing issues and is only used 2x / month. He has not used SL nitro.  2018 cath as above with moderate disease in LAD and RCA.  07/2019 recommendation by his primary cardiology for urgent cardiac catheterization has been deferred due to COVID-19 panedemic and scheduling issues.   --Today, he is agreeable to recommendation for cardiac catheterization and scheduled for outpatient left heart cardiac catheterization to occur 03/07/20 with Dr. Fletcher Anon.  Risks and benefits of cardiac catheterization have been discussed with the patient.  These include bleeding, infection, kidney damage, stroke, heart attack, death.  The patient understands these risks and is willing to proceed. --Continue medical management with ASA, Plavix, metoprolol, Lipitor, and as needed sublingual nitro. Reports increased bruising today and since initiation of Plavix with recommendation to continue DAPT per GDMT and given he denies any s/sx of bleeding. Will check CBC. Recheck BMET pending LHC with contrast. CXR recommended at previous visits and will be obtained today. He will need a pre-cath COVID-19 test. Further recommendations pending cath.   HLD --Previous LDL 80 and above LDL goal <70; therefore, Zetia was added to atorvastatin 80mg . Continue current statin and Zetia with repeat lipid labs ordered today to reassess LDL s/p addition of Zetia. If LDL still uncontrolled at recheck, will need to consider PCSK9i +/- lipid clinic referral. Further recommendations pending labs.   Current Tobacco use  --He is currently smoking 1 pack/day.  He previously had success quitting smoking on Chantix but restarted smoking shortly after this was no longer covered by insurance.  Briefly discussed Wellbutrin and other possible  options for smoking cessation and encouraged him to discuss these with his PCP. He is currently looking for a new PCP; therefore, number provided to assist him with this PCP search today. Encouraged him  to continue to work towards smoking cessation in the meantime. Reassess use at RTC.  DOE --Consider multifactorial etiology given ongoing tobacco use, CAD, and likely deconditioning.  Notes that he feels complete improvement with albuterol, used twice per month. As previously recommended during telemedicine visits, and given pulmonary exam today, will obtain CXR. Smoking cessation recommended. Further recommendations if needed pending cath.   Vascular Dz PAD s/p aortobifemoral bypass  --Denies any claudication sx with history of PAD s/p aortobifemoral bypass. No temperature or color changes. Continue ASA, Plavix, statin, and Zetia. Repeat LDL as above with further recommendations at that time if needed for optimal LDL control.  --Could consider carotid study in the future if progression of sx as outlined in HPI. Given no bruit appreciated on exam and visual changes only occur with vigorous shaking of his head, will defer for now. Instructed him to notify us if progression of sx   Indigestion --Reports improvement with Omeprazole. GERD CP described as different from CP like before his previous cath. Continue Omeprazole and to avoid foods that aggravate GERD. No s/sx of GIB reported today.  Medication changes: None Labs ordered: CBC, CMET, Lipids / direct LDL Studies / Imaging ordered: Outpatient LHC, CXR Future considerations: PCSK9i/Lipid clinic if LDL still uncontrolled. Carotid US if ongoing or progression of sx as above.  Disposition: RTC following LHC   Lennon Alstrom, PA-C 03/04/2020

## 2020-03-04 NOTE — [facility] Notes (Signed)
Office Visit    Patient Name: [name] Date of Encounter: 03/04/2020  Primary Care Provider:  Medicine, [facility] Primary Cardiologist:  Lorine Bears, MD  Chief Complaint    Chief Complaint  Patient presents with  . OTHER    1 month f/u CAD c/o bruising all around body/pain. Meds reviewed verbally with pt.    59 year old male with PMH of CAD, PAD, COPD, hyperlipidemia, and current tobacco use (1 pk/day), and seen today for in person 1 month follow-up and given previous recommendations for LHC.  Past Medical History    Past Medical History:  Diagnosis Date  . COPD (chronic obstructive pulmonary disease) (HCC)   . Coronary artery disease 08/2017   Anterolateral ST elevation myocardial infarction.  Cardiac catheterization showed subtotal occlusion of first diagonal treated successfully with PCI and drug-eluting stent placement.  There was moderate disease affecting the proximal LAD and mid to distal RCA.  Ejection fraction was normal.  . Emphysema lung (HCC)   . Hyperlipidemia   . Medical history non-contributory   . PAD (peripheral artery disease) (HCC)    Status post aortobifemoral bypass in 2015  . Tobacco use    Past Surgical History:  Procedure Laterality Date  . BYPASS GRAFT     in both lower limbs  . CORONARY/GRAFT ACUTE MI REVASCULARIZATION N/A 09/13/2017   Procedure: Coronary/Graft Acute MI Revascularization;  Surgeon: Iran Ouch, MD;  Location: [facility] INVASIVE CV LAB;  Service: Cardiovascular;  Laterality: N/A;  . LEFT HEART CATH AND CORONARY ANGIOGRAPHY N/A 09/13/2017   Procedure: LEFT HEART CATH AND CORONARY ANGIOGRAPHY;  Surgeon: Iran Ouch, MD;  Location: [facility] INVASIVE CV LAB;  Service: Cardiovascular;  Laterality: N/A;    Allergies  No Known Allergies  History of Present Illness    [name] is a 59 y.o. male with PMH as above.  He was last seen in the office 01/29/2020.  He has a history of PAD  s/p arterial bifemoral bypass in 2015 by Dr. Gilda Crease.  His CAD is s/p anterior lateral STEMI in November 2018.  Emergent cardiac catheterization with subtotal occlusion of large first diagonal with moderate pLAD disease and moderate RCA disease.  Coronary arteries are moderately calcified.  EF was normal.  He underwent angioplasty and DES to first diagonal.  Drug screen was positive for cocaine.  He was seen 07/2019 and had cut down on his smoking with Chantix.  Medicaid only paid for 30-day supply of Chantix, and he was unable to afford this so had return to smoking.  It was recommended he undergo cardiac catheterization at that time, but he declined due to concerns regarding the COVID-19 pandemic.  He was seen via telemedicine 11/25/2019 and continued to decline cardiac catheterization due to concerns regarding COVID-19.  He continued to report chest pain.  He was seen 01/29/2020 via telemedicine and reported constant numbness and tingling in his left pink and ring finger after getting his second COVID-19 vaccine.  No reported changes in color or temperature.  He was advised that this was likely not related to the vaccine.  He also reported chest pain that had improved after starting Prilosec over-the-counter.  The chest pain was described as intermittent in frequency and in the center of his chest, occurring mostly in the morning, and with duration varying from a few minutes to a couple of hours. He had not taken his sublingual nitro. It was further described as consistent with his previous anginal symptoms  with known 60% occlusion by cath in 2018. Cardiac catheterization was recommended but unable to be performed until late May per his schedule. He was smoking 1 pack/day and not interested in quitting smoking at that time.  He did not have a blood pressure cuff at home and was encouraged to purchase one.  Recommendation was for in office evaluation for pre-cath visit, as well as obtain labs, CXR, and EKG  before LHC.  Today, he returns to clinic and reports ongoing symptoms as outlined above.  He continues to experience chest pain that is worse in the morning and located in the center of his chest, lasting anywhere from minutes to hours and then dissipating on its own.  He has not used any sublingual nitro. He notices the CP more when at rest but also notes it occurs with activity and no clear triggers. He also notes pain 2/2 recent bruising that has started since initiation of Plavix.   He specifically notes an increase in bruising following working on his motorcycle.  He denies any signs or symptoms of bleeding, including BRBPR, hematuria, hematochezia, and melena.  He notes stable breathing without SOB at rest and stable DOE. He reports recent sinus infection.  He currently uses his albuterol 2 times per month and reports this always alleviates any breathing issues / DOE that he may have at that time.  He denies any orthopnea, lower extremity edema, abdominal distention, or early satiety. He denies racing HR or palpitations. He does note sx consistent with amaurosis fugax but only if shaking his head vigorously. He continues to note finger numbness in his left pinky and ring finger s/p COVID-19 vaccination and with recommendations as below.   He notes frustration that he had quit smoking for 1 month until unable to afford Plavix.  Due to family stressors, he restarted smoking 1 month later and currently smoking 1 pack/day.  Possible smoking cessation methods were discussed as below.  He reports approximately 2 beers per day.  No illegal drug use.  He confirms medication compliance.  He states today that he is eager to schedule his cardiac catheterization. He reports that he is still working on [employer], and that his lawyer is currently attempting to clarify his current insurance.     Home Medications    Prior to Admission medications   Medication Sig Start Date End Date Taking? Authorizing  Provider  albuterol (PROVENTIL HFA;VENTOLIN HFA) 108 (90 Base) MCG/ACT inhaler Inhale 2 puffs into the lungs every 6 (six) hours as needed for wheezing or shortness of breath. 12/31/18   Nita Sickle, MD  aspirin 81 MG chewable tablet Chew 1 tablet (81 mg total) by mouth daily. 09/16/17   Altamese Dilling, MD  atorvastatin (LIPITOR) 80 MG tablet TAKE 1 TABLET BY MOUTH EVERY DAY AT 6PM 07/22/18   Iran Ouch, MD  clopidogrel (PLAVIX) 75 MG tablet Take 1 tablet by mouth once daily 07/27/19   Iran Ouch, MD  ezetimibe (ZETIA) 10 MG tablet Take 1 tablet (10 mg total) by mouth daily. 06/24/19 11/24/28  Dunn, Raymon Mutton, PA-C  metoprolol succinate (TOPROL-XL) 25 MG 24 hr tablet TAKE 1 TABLET BY MOUTH ONCE DAILY (TAKE  WITH  OR  IMMEDIATELY  FOLLOWING  A  MEAL) 02/22/20   Iran Ouch, MD  nitroGLYCERIN (NITROSTAT) 0.4 MG SL tablet DISSOLVE ONE TABLET UNDER THE TONGUE EVERY FIVE MINUTES AS NEEDED FOR CHEST PAIN 11/03/19   Iran Ouch, MD    Review of Systems  He denies palpitations, SOB at rest, pnd, orthopnea, n, v, dizziness, syncope, edema, weight gain, or early satiety.  He reports ongoing chest pain, recent increase in bruising attributed to Plavix, stable DOE (alleviated with inhaler), and spots in his vision that occur with vigorous shaking of his head.   All other systems reviewed and are otherwise negative except as noted above.  Physical Exam    VS:  BP 120/90 (BP Location: Left Arm, Patient Position: Sitting, Cuff Size: Normal)   Pulse 75   Ht 5' 7" (1.702 m)   Wt 154 lb 2 oz (69.9 kg)   SpO2 98%   BMI 24.14 kg/m  , BMI Body mass index is 24.14 kg/m. GEN: Well nourished, well developed, in no acute distress. Mask in place. HEENT: normal. Neck: Supple, no JVD, carotid bruits, or masses. Cardiac: RRR, no murmurs, rubs, or gallops. No clubbing, cyanosis, edema.  Radials/DP/PT 2+ and equal bilaterally.  Respiratory:  Coarse breath sounds bilaterally. GI: Soft,  nontender, nondistended, BS + x 4. MS: no deformity or atrophy.  Skin: warm and dry, no rash.  Bruising noted on right side of chest and along left upper extremity. Neuro:  Strength and sensation are intact. Psych: Normal affect.  Accessory Clinical Findings    ECG personally reviewed by me today - NSR, 75bpm, Qtc 402ms, possible lead placement change in V1, no acute change from previous - no acute changes.  VITALS Reviewed today   Temp Readings from Last 3 Encounters:  12/31/18 98.2 F (36.8 C) (Oral)  09/15/17 97.9 F (36.6 C) (Oral)   BP Readings from Last 3 Encounters:  03/04/20 120/90  07/31/19 100/70  06/23/19 100/78   Pulse Readings from Last 3 Encounters:  03/04/20 75  07/31/19 74  06/23/19 87    Wt Readings from Last 3 Encounters:  03/04/20 154 lb 2 oz (69.9 kg)  01/29/20 155 lb (70.3 kg)  11/25/19 150 lb (68 kg)     LABS  reviewed today   CareEverwhere Labs present and most recent? Yes/No: No  Lab Results  Component Value Date   WBC 8.3 06/23/2019   HGB 14.0 06/23/2019   HCT 40.5 06/23/2019   MCV 86.0 06/23/2019   PLT 241 06/23/2019   Lab Results  Component Value Date   CREATININE 0.59 (L) 06/23/2019   BUN 5 (L) 06/23/2019   NA 130 (L) 06/23/2019   K 3.7 06/23/2019   CL 97 (L) 06/23/2019   CO2 20 (L) 06/23/2019   Lab Results  Component Value Date   ALT 25 06/23/2019   AST 33 06/23/2019   ALKPHOS 69 06/23/2019   BILITOT 0.6 06/23/2019   Lab Results  Component Value Date   CHOL 170 06/23/2019   HDL 67 06/23/2019   LDLCALC 80 06/23/2019   LDLDIRECT 87.4 06/23/2019   TRIG 113 06/23/2019   CHOLHDL 2.5 06/23/2019    Lab Results  Component Value Date   HGBA1C 5.5 09/13/2017   No results found for: TSH   STUDIES/PROCEDURES reviewed today   Cardiac cath 08/2017  Prox LAD lesion is 60% stenosed.  Ost 1st Diag to 1st Diag lesion is 99% stenosed.  A drug-eluting stent was successfully placed using a STENT RESOLUTE ONYX 2.5X26.  Post  intervention, there is a 100% residual stenosis.  Mid RCA lesion is 45% stenosed.  The left ventricular systolic function is normal.  LV end diastolic pressure is mildly elevated.  There is trivial (1+) mitral regurgitation.  The left ventricular ejection fraction   is 55-65% by visual estimate. 1. Severe one-vessel coronary artery disease with subtotal occlusion of large first diagonal which is the culprit for anterolateral ST elevation myocardial infarction. There is also moderate proximal LAD disease at the origin of the first diagonal branch and moderate RCA disease. 2. Normal LV systolic function and mildly elevated left ventricular end-diastolic pressure. 3. Successful angioplasty and drug-eluting stent placement to the first diagonal. Recommendations: Dual antiplatelet therapy for at least one year. Aggressive treatment of risk factors, smoking cessation and cardiac rehab.  Echo 09/2017 Left ventricle: The cavity size was normal. Wall thickness was  normal. Systolic function was normal. The estimated ejection  fraction was in the range of 55% to 60%. Mid anterolateral severe  hypokinesis. Doppler parameters are consistent with abnormal left  ventricular relaxation (grade 1 diastolic dysfunction).  - Aortic valve: There was no stenosis.  - Mitral valve: There was no regurgitation.  - Right ventricle: The cavity size was normal. Systolic function  was normal.  - Pulmonary arteries: PA peak pressure: 28 mm Hg (S).  - Inferior vena cava: The vessel was normal in size. The  respirophasic diameter changes were in the normal range (>= 50%),  consistent with normal central venous pressure.  Impressions:  - Normal LV size with EF 55-60%. Severe mid anterolateral  hypokinesis. Normal RV size and systolic function. No significant  valvular abnormalities.   07/2020 Vas US aorta Abdominal Aorta: No evidence of an abdominal aortic aneurysm was  visualized. The  largest aortic measurement is 2.0 cm.   Assessment & Plan    CAD with unstable angina --CP reported with both exertion and at rest, lasting minutes to hours. Notes DOE, though states albuterol completely alleviates any breathing issues and is only used 2x / month. He has not used SL nitro.  2018 cath as above with moderate disease in LAD and RCA.  07/2019 recommendation by his primary cardiology for urgent cardiac catheterization has been deferred due to COVID-19 panedemic and scheduling issues.   --Today, he is agreeable to recommendation for cardiac catheterization and scheduled for outpatient left heart cardiac catheterization to occur 03/07/20 with Dr. Fletcher Anon.  Risks and benefits of cardiac catheterization have been discussed with the patient.  These include bleeding, infection, kidney damage, stroke, heart attack, death.  The patient understands these risks and is willing to proceed. --Continue medical management with ASA, Plavix, metoprolol, Lipitor, and as needed sublingual nitro. Reports increased bruising today and since initiation of Plavix with recommendation to continue DAPT per GDMT and given he denies any s/sx of bleeding. Will check CBC. Recheck BMET pending LHC with contrast. CXR recommended at previous visits and will be obtained today. He will need a pre-cath COVID-19 test. Further recommendations pending cath.   HLD --Previous LDL 80 and above LDL goal <70; therefore, Zetia was added to atorvastatin 80mg . Continue current statin and Zetia with repeat lipid labs ordered today to reassess LDL s/p addition of Zetia. If LDL still uncontrolled at recheck, will need to consider PCSK9i +/- lipid clinic referral. Further recommendations pending labs.   Current Tobacco use  --He is currently smoking 1 pack/day.  He previously had success quitting smoking on Chantix but restarted smoking shortly after this was no longer covered by insurance.  Briefly discussed Wellbutrin and other possible  options for smoking cessation and encouraged him to discuss these with his PCP. He is currently looking for a new PCP; therefore, number provided to assist him with this PCP search today. Encouraged him  to continue to work towards smoking cessation in the meantime. Reassess use at RTC.  DOE --Consider multifactorial etiology given ongoing tobacco use, CAD, and likely deconditioning.  Notes that he feels complete improvement with albuterol, used twice per month. As previously recommended during telemedicine visits, and given pulmonary exam today, will obtain CXR. Smoking cessation recommended. Further recommendations if needed pending cath.   Vascular Dz PAD s/p aortobifemoral bypass  --Denies any claudication sx with history of PAD s/p aortobifemoral bypass. No temperature or color changes. Continue ASA, Plavix, statin, and Zetia. Repeat LDL as above with further recommendations at that time if needed for optimal LDL control.  --Could consider carotid study in the future if progression of sx as outlined in HPI. Given no bruit appreciated on exam and visual changes only occur with vigorous shaking of his head, will defer for now. Instructed him to notify us if progression of sx   Indigestion --Reports improvement with Omeprazole. GERD CP described as different from CP like before his previous cath. Continue Omeprazole and to avoid foods that aggravate GERD. No s/sx of GIB reported today.  Medication changes: None Labs ordered: CBC, CMET, Lipids / direct LDL Studies / Imaging ordered: Outpatient LHC, CXR Future considerations: PCSK9i/Lipid clinic if LDL still uncontrolled. Carotid US if ongoing or progression of sx as above.  Disposition: RTC following LHC   Lennon Alstrom, PA-C 03/04/2020

## 2020-03-05 LAB — COMPREHENSIVE METABOLIC PANEL
ALT: 37 IU/L (ref 0–44)
AST: 50 IU/L — ABNORMAL HIGH (ref 0–40)
Albumin/Globulin Ratio: 1.4 (ref 1.2–2.2)
Albumin: 4.4 g/dL (ref 3.8–4.9)
Alkaline Phosphatase: 80 IU/L (ref 48–121)
BUN/Creatinine Ratio: 4 — ABNORMAL LOW (ref 9–20)
BUN: 3 mg/dL — ABNORMAL LOW (ref 6–24)
Bilirubin Total: 0.4 mg/dL (ref 0.0–1.2)
CO2: 22 mmol/L (ref 20–29)
Calcium: 9.5 mg/dL (ref 8.7–10.2)
Chloride: 97 mmol/L (ref 96–106)
Creatinine, Ser: 0.71 mg/dL — ABNORMAL LOW (ref 0.76–1.27)
GFR calc Af Amer: 120 mL/min/{1.73_m2} (ref >59)
GFR calc non Af Amer: 104 mL/min/{1.73_m2} (ref >59)
Globulin, Total: 3.2 g/dL (ref 1.5–4.5)
Glucose: 83 mg/dL (ref 65–99)
Potassium: 4.6 mmol/L (ref 3.5–5.2)
Sodium: 136 mmol/L (ref 134–144)
Total Protein: 7.6 g/dL (ref 6.0–8.5)

## 2020-03-05 LAB — CBC
Hematocrit: 41.2 % (ref 37.5–51.0)
Hemoglobin: 14.3 g/dL (ref 13.0–17.7)
MCH: 29.9 pg (ref 26.6–33.0)
MCHC: 34.7 g/dL (ref 31.5–35.7)
MCV: 86 fL (ref 79–97)
Platelets: 295 10*3/uL (ref 150–450)
RBC: 4.79 x10E6/uL (ref 4.14–5.80)
RDW: 13.1 % (ref 11.6–15.4)
WBC: 7.7 10*3/uL (ref 3.4–10.8)

## 2020-03-05 LAB — LIPID PANEL
Chol/HDL Ratio: 2.1 ratio (ref 0.0–5.0)
Cholesterol, Total: 134 mg/dL (ref 100–199)
HDL: 65 mg/dL (ref >39)
LDL Chol Calc (NIH): 54 mg/dL (ref 0–99)
Triglycerides: 75 mg/dL (ref 0–149)
VLDL Cholesterol Cal: 15 mg/dL (ref 5–40)

## 2020-03-05 LAB — SARS CORONAVIRUS 2 (TAT 6-24 HRS): SARS Coronavirus 2: NEGATIVE

## 2020-03-05 LAB — LDL CHOLESTEROL, DIRECT: LDL Direct: 55 mg/dL (ref 0–99)

## 2020-03-07 ENCOUNTER — Telehealth: Payer: Self-pay

## 2020-03-07 ENCOUNTER — Encounter
Admission: RE | Disposition: A | Discharge status: Home / Self Care | Payer: Self-pay | Source: Home / Self Care | Attending: Cardiovascular Disease

## 2020-03-07 ENCOUNTER — Ambulatory Visit
Admission: RE | Admit: 2020-03-07 | Discharge: 2020-03-07 | Disposition: A | Discharge status: Home / Self Care | Payer: Medicare Other | Attending: Cardiovascular Disease | Admitting: Cardiovascular Disease

## 2020-03-07 ENCOUNTER — Encounter: Payer: Self-pay | Admitting: Cardiovascular Disease

## 2020-03-07 ENCOUNTER — Other Ambulatory Visit: Payer: Self-pay

## 2020-03-07 DIAGNOSIS — Z79899 Other long term (current) drug therapy: Secondary | ICD-10-CM | POA: Diagnosis not present

## 2020-03-07 DIAGNOSIS — K219 Gastro-esophageal reflux disease without esophagitis: Secondary | ICD-10-CM | POA: Diagnosis not present

## 2020-03-07 DIAGNOSIS — I25110 Atherosclerotic heart disease of native coronary artery with unstable angina pectoris: Secondary | ICD-10-CM | POA: Diagnosis present

## 2020-03-07 DIAGNOSIS — J449 Chronic obstructive pulmonary disease, unspecified: Secondary | ICD-10-CM | POA: Diagnosis not present

## 2020-03-07 DIAGNOSIS — Z7902 Long term (current) use of antithrombotics/antiplatelets: Secondary | ICD-10-CM | POA: Insufficient documentation

## 2020-03-07 DIAGNOSIS — Z7982 Long term (current) use of aspirin: Secondary | ICD-10-CM | POA: Insufficient documentation

## 2020-03-07 DIAGNOSIS — I739 Peripheral vascular disease, unspecified: Secondary | ICD-10-CM | POA: Diagnosis not present

## 2020-03-07 DIAGNOSIS — F17210 Nicotine dependence, cigarettes, uncomplicated: Secondary | ICD-10-CM | POA: Insufficient documentation

## 2020-03-07 DIAGNOSIS — E785 Hyperlipidemia, unspecified: Secondary | ICD-10-CM | POA: Diagnosis not present

## 2020-03-07 DIAGNOSIS — I200 Unstable angina: Secondary | ICD-10-CM

## 2020-03-07 DIAGNOSIS — I252 Old myocardial infarction: Secondary | ICD-10-CM | POA: Insufficient documentation

## 2020-03-07 HISTORY — PX: LEFT HEART CATH AND CORONARY ANGIOGRAPHY: CATH118249

## 2020-03-07 HISTORY — PX: CORONARY STENT INTERVENTION: CATH118234

## 2020-03-07 LAB — POCT ACTIVATED CLOTTING TIME
Activated Clotting Time: 257 seconds
Activated Clotting Time: 301 seconds

## 2020-03-07 SURGERY — LEFT HEART CATH AND CORONARY ANGIOGRAPHY
Anesthesia: Moderate Sedation

## 2020-03-07 MED ORDER — ASPIRIN 81 MG PO CHEW: 81.0000 mg | CHEWABLE_TABLET | Freq: Every day | ORAL | Status: DC

## 2020-03-07 MED ORDER — CLOPIDOGREL BISULFATE 75 MG PO TABS
ORAL_TABLET | ORAL | Status: DC | PRN
  Administered 2020-03-07: 300 mg via ORAL

## 2020-03-07 MED ORDER — EZETIMIBE 10 MG PO TABS: 10.0000 mg | ORAL_TABLET | Freq: Every day | ORAL | Status: DC

## 2020-03-07 MED ORDER — FENTANYL CITRATE (PF) 100 MCG/2ML IJ SOLN
INTRAMUSCULAR | Status: AC
  Filled 2020-03-07: qty 2

## 2020-03-07 MED ORDER — SODIUM CHLORIDE 0.9 % IV SOLN: 250.0000 mL | INTRAVENOUS | Status: DC | PRN

## 2020-03-07 MED ORDER — PANTOPRAZOLE SODIUM 40 MG PO TBEC: 40.0000 mg | DELAYED_RELEASE_TABLET | Freq: Every day | ORAL | 5 refills | Status: DC

## 2020-03-07 MED ORDER — PANTOPRAZOLE SODIUM 40 MG PO TBEC
40.0000 mg | DELAYED_RELEASE_TABLET | Freq: Every day | ORAL | Status: DC
  Filled 2020-03-07 (×2): qty 1

## 2020-03-07 MED ORDER — MIDAZOLAM HCL 2 MG/2ML IJ SOLN
INTRAMUSCULAR | Status: DC | PRN
  Administered 2020-03-07: 1 mg via INTRAVENOUS

## 2020-03-07 MED ORDER — IOHEXOL 300 MG/ML  SOLN
INTRAMUSCULAR | Status: DC | PRN
  Administered 2020-03-07: 120 mL

## 2020-03-07 MED ORDER — ALBUTEROL SULFATE HFA 108 (90 BASE) MCG/ACT IN AERS: 2.0000 | INHALATION_SPRAY | Freq: Four times a day (QID) | RESPIRATORY_TRACT | Status: DC | PRN

## 2020-03-07 MED ORDER — SODIUM CHLORIDE 0.9% FLUSH: 3.0000 mL | Freq: Two times a day (BID) | INTRAVENOUS | Status: DC

## 2020-03-07 MED ORDER — HEPARIN (PORCINE) IN NACL 1000-0.9 UT/500ML-% IV SOLN
INTRAVENOUS | Status: DC | PRN
  Administered 2020-03-07: 500 mL

## 2020-03-07 MED ORDER — METOPROLOL SUCCINATE ER 50 MG PO TB24: 25.0000 mg | ORAL_TABLET | Freq: Every day | ORAL | Status: DC

## 2020-03-07 MED ORDER — ONDANSETRON HCL 4 MG/2ML IJ SOLN: 4.0000 mg | Freq: Four times a day (QID) | INTRAMUSCULAR | Status: DC | PRN

## 2020-03-07 MED ORDER — VERAPAMIL HCL 2.5 MG/ML IV SOLN
INTRAVENOUS | Status: AC
  Filled 2020-03-07: qty 2

## 2020-03-07 MED ORDER — HEPARIN (PORCINE) IN NACL 1000-0.9 UT/500ML-% IV SOLN
INTRAVENOUS | Status: AC
  Filled 2020-03-07: qty 1000

## 2020-03-07 MED ORDER — SODIUM CHLORIDE 0.9 % IV SOLN: INTRAVENOUS | Status: DC

## 2020-03-07 MED ORDER — HEPARIN SODIUM (PORCINE) 1000 UNIT/ML IJ SOLN
INTRAMUSCULAR | Status: AC
  Filled 2020-03-07: qty 1

## 2020-03-07 MED ORDER — CLOPIDOGREL BISULFATE 75 MG PO TABS: 75.0000 mg | ORAL_TABLET | Freq: Every day | ORAL | Status: DC

## 2020-03-07 MED ORDER — CLOPIDOGREL BISULFATE 300 MG PO TABS
ORAL_TABLET | ORAL | Status: AC
  Filled 2020-03-07: qty 1

## 2020-03-07 MED ORDER — SODIUM CHLORIDE 0.9% FLUSH: 3.0000 mL | INTRAVENOUS | Status: DC | PRN

## 2020-03-07 MED ORDER — ACETAMINOPHEN 325 MG PO TABS: 650.0000 mg | ORAL_TABLET | ORAL | Status: DC | PRN

## 2020-03-07 MED ORDER — MIDAZOLAM HCL 2 MG/2ML IJ SOLN
INTRAMUSCULAR | Status: AC
  Filled 2020-03-07: qty 2

## 2020-03-07 MED ORDER — ATORVASTATIN CALCIUM 80 MG PO TABS: 80.0000 mg | ORAL_TABLET | Freq: Every day | ORAL | Status: DC

## 2020-03-07 MED ORDER — VERAPAMIL HCL 2.5 MG/ML IV SOLN
INTRAVENOUS | Status: DC | PRN
  Administered 2020-03-07: 2.5 mg via INTRA_ARTERIAL

## 2020-03-07 MED ORDER — FENTANYL CITRATE (PF) 100 MCG/2ML IJ SOLN
INTRAMUSCULAR | Status: DC | PRN
  Administered 2020-03-07: 50 ug via INTRAVENOUS

## 2020-03-07 MED ORDER — NITROGLYCERIN 0.4 MG SL SUBL: 0.4000 mg | SUBLINGUAL_TABLET | SUBLINGUAL | Status: DC | PRN

## 2020-03-07 MED ORDER — HEPARIN SODIUM (PORCINE) 1000 UNIT/ML IJ SOLN
INTRAMUSCULAR | Status: DC | PRN
  Administered 2020-03-07: 4000 [IU] via INTRAVENOUS
  Administered 2020-03-07: 3500 [IU] via INTRAVENOUS
  Administered 2020-03-07: 2000 [IU] via INTRAVENOUS

## 2020-03-07 SURGICAL SUPPLY — 15 items
BALLN ~~LOC~~ EUPHORA RX 2.5X15 (BALLOONS) ×4
BALLN ~~LOC~~ EUPHORA RX 3.0X15 (BALLOONS) ×4
BALLOON ~~LOC~~ EUPHORA RX 2.5X15 (BALLOONS) IMPLANT
BALLOON ~~LOC~~ EUPHORA RX 3.0X15 (BALLOONS) IMPLANT
CATH INFINITI 5FR JK (CATHETERS) ×4 IMPLANT
CATH LAUNCHER 6FR EBU3.5 (CATHETERS) ×4 IMPLANT
DEVICE INFLAT 30 PLUS (MISCELLANEOUS) ×4 IMPLANT
DEVICE RAD TR BAND REGULAR (VASCULAR PRODUCTS) ×2 IMPLANT
GLIDESHEATH SLEND SS 6F .021 (SHEATH) ×4 IMPLANT
GUIDEWIRE INQWIRE 1.5J.035X260 (WIRE) ×2 IMPLANT
GUIDEWIRE PRESS OMNI 185 ST (WIRE) ×4 IMPLANT
INQWIRE 1.5J .035X260CM (WIRE) ×4
KIT MANI 3VAL PERCEP (MISCELLANEOUS) ×4 IMPLANT
PACK CARDIAC CATH (CUSTOM PROCEDURE TRAY) ×2 IMPLANT
STENT RESOLUTE ONYX 2.5X22 (Permanent Stent) ×2 IMPLANT

## 2020-03-07 NOTE — Discharge Summary (Signed)
Discharge Summary    Patient ID: [name]  MRN: [id number], DOB/AGE: 1960-12-30 59 y.o.  Admit Date: 03/07/2020 Discharge Date: 03/07/2020  Primary Care Provider: Medicine, [facility] Primary Cardiologist: Dr. Fletcher Anon, MD  Discharge Diagnoses    Principal Problem:   Coronary artery disease involving native coronary artery of native heart with unstable angina pectoris [facility]) Active Problems:   COPD (chronic obstructive pulmonary disease) (HCC)   GERD (gastroesophageal reflux disease)   PAD (peripheral artery disease) (HCC)   HLD (hyperlipidemia)   Allergies No Known Allergies   History of Present Illness     59 year old male with history of CAD with anterolateral STEMI in 08/2017 s/p PCI/DES to the D1, PAD s/p aortobifemoral bypass in 2015 with underlying claudication and normal ABIs bilaterally with patent aortobifemoral bypass on most recent noninvasive imaging, COPD, HLD, and polysubstance use with prior cocaine and ongoing tobacco use who was seen recently in the office with unstable angina.   He was admitted to the hospital in 08/2017 with an anterolateral ST elevation MI.  Emergent cardiac cath showed subtotal occlusion of a large first diagonal with moderate proximal LAD disease and moderate RCA disease.  The coronary arteries were moderately calcified.  EF was normal.  He underwent successful PCI/DES to the first diagonal.  Drug screen was positive for cocaine at that time.  Following his MI, he did quit smoking though relapsed thereafter following the death of a friend.  He was seen virtually in 07/2019 and was down to a few cigarettes per day on Chantix.  At that time, he noted a several month history of almost daily substernal chest that occurred mostly at rest and will last for a few minutes in duration.  He noted significant exertional dyspnea tightness.  LHC was recommended though deferred by the patient at that time.  He was seen virtually in 11/2019  and continued to note ongoing angina though continued to decline LHC in the setting of the COVID-19 pandemic.  Medicaid only pay for 30-day supply of Chantix and he was unable to afford this thereafter.  In this setting, he subsequently resumed smoking.  He was seen virtually in 01/2020 with ongoing chest pain and exertional dyspnea lasting minutes to hours in duration.  He was agreeable to proceed with diagnostic cath after 03/03/2020.  He was seen in the office on 03/04/2020, with ongoing symptoms and recommendation to proceed with diagnostic LHC.    Hospital Course     Consultants: None  He presented to [facility] on 03/07/2020, for scheduled outpatient diagnostic LHC which showed significant underlying one-vessel CAD with widely patent stent in the first to D1 with minimal restenosis.  There was 70% proximal stenosis in the LAD at the origin of the first diagonal which was not significant by IFR with a ratio of 0.86.  There was a stable moderate mid RCA disease.  Normal LV systolic function and normal LVEDP.  He underwent successful PCI/DES to the proximal LAD with a resolute Onyx drug-eluting stent (2.5 x 22 mm).  A 2.5 stent was used but postdilated to high pressure with a 3.0 noncompliant balloon proximally given the vessel size mismatch.  Continue dual antiplatelet therapy and aggressive treatment of risk factors was recommended, including the importance of smoking cessation.  Goal of 0.75 pack per day by his follow up next week.  His omeprazole was discontinued and he was placed on a Protonix in its place.  The patient's right radial cath site  has been examined is healing well without issues at this time.  Importance of medications in post-cath instructions discussed in detail.  The patient has been seen by Dr. Fletcher Anon and felt to be stable for same day discharge today. All follow up appointments have been made. Discharge medications are listed below. Prescriptions have been reviewed with the patient and sent  in to their pharmacy. Patient's sister is here to transport him back to her house. He will be staying with her overnight.  _____________  Discharge Vitals Blood pressure 134/85, pulse 76, temperature 98.1 F (36.7 C), temperature source Oral, resp. rate 16, height _0  (1.702 m), weight 70.3 kg, SpO2 95 %.  Filed Weights   03/07/20 0840  Weight: 70.3 kg   Physical Exam: GEN: No acute distress.   Neck: No JVD. Cardiac: RRR, no murmurs, rubs, or gallops. Right radial cardiac cath site is well healing without active bleeding, bruising, swelling, warmth, erythema, or tenderness to palpation.  Radial pulse 2+. Respiratory: Clear to auscultation bilaterally.  GI: Soft, nontender, non-distended.   MS: No edema; No deformity. Neuro:  Alert and oriented x 3; Nonfocal.  Psych: Normal affect.  Labs & Radiologic Studies    CBC No results for input(s): WBC, NEUTROABS, HGB, HCT, MCV, PLT in the last 72 hours. Basic Metabolic Panel No results for input(s): NA, K, CL, CO2, GLUCOSE, BUN, CREATININE, CALCIUM, MG, PHOS in the last 72 hours. Liver Function Tests No results for input(s): AST, ALT, ALKPHOS, BILITOT, PROT, ALBUMIN in the last 72 hours. No results for input(s): LIPASE, AMYLASE in the last 72 hours. Cardiac Enzymes No results for input(s): CKTOTAL, CKMB, CKMBINDEX, TROPONINI in the last 72 hours. BNP Invalid input(s): POCBNP D-Dimer No results for input(s): DDIMER in the last 72 hours. Hemoglobin A1C No results for input(s): HGBA1C in the last 72 hours. Fasting Lipid Panel No results for input(s): CHOL, HDL, LDLCALC, TRIG, CHOLHDL, LDLDIRECT in the last 72 hours. Thyroid Function Tests No results for input(s): TSH, T4TOTAL, T3FREE, THYROIDAB in the last 72 hours.  Invalid input(s): FREET3 _____________  DG Chest 2 View  Result Date: 03/04/2020 IMPRESSION: Mild peribronchial cuffing. Bronchitis cannot be excluded. Mild left base subsegmental atelectasis and or scarring.  Electronically Signed   By: Marcello Moores  Register   On: 03/04/2020 14:02    Diagnostic Studies/Procedures   LHC 03/07/2020:  Ost 1st Diag to 1st Diag lesion is 10% stenosed.  Balloon angioplasty was performed.  Prox LAD lesion is 70% stenosed.  Mid RCA lesion is 45% stenosed.  The left ventricular systolic function is normal.  LV end diastolic pressure is normal.  The left ventricular ejection fraction is 55-65% by visual estimate.  Post intervention, there is a 0% residual stenosis.  A drug-eluting stent was successfully placed using a STENT RESOLUTE ONYX 2.5X22.   1.  Significant underlying one-vessel coronary artery disease.  Widely patent stent in the first diagonal with minimal restenosis.  70% proximal stenosis in the LAD at the origin of the first diagonal which was significant by flow reserve evaluation with an iFR ratio of 0.86.  Stable moderate mid RCA disease. 2.  Normal LV systolic function and normal left ventricular end-diastolic pressure. 3.  Successful angioplasty and drug-eluting stent placement to the proximal LAD.  A 2.5 stent was used but postdilated to high pressure with a 3.0 noncompliant balloon proximally given vessel size mismatch.  Recommendations: Continue dual antiplatelet therapy. Continue aggressive treatment of risk factors. Discussed with the patient the importance of smoking  cessation. The patient is a candidate for same-day PCI discharge if he remains stable. _______________   Diagnostic Dominance: Right Left Main  The vessel exhibits minimal luminal irregularities.  Left Anterior Descending  Prox LAD lesion 70% stenosed  Prox LAD lesion is 70% stenosed. The lesion is type C. The lesion is moderately calcified. The lesion was not previously treated. Pressure wire/FFR was performed on the lesion. Fractional flow reserve was performed on the proximal LAD lesion. Pressure wire was used in the standard fashion. iFR ratio was 0.86.  First Diagonal  Branch  Vessel is large in size.  Ost 1st Diag to 1st Diag lesion 10% stenosed  Ost 1st Diag to 1st Diag lesion is 10% stenosed. The lesion is type C. The lesion is moderately calcified. The lesion was previously treated.  Third Diagonal Branch  There is mild disease in the vessel.  Left Circumflex  There is mild diffuse disease throughout the vessel.  First Obtuse Marginal Branch  The vessel exhibits minimal luminal irregularities.  Second Obtuse Marginal Branch  Vessel is angiographically normal.  Third Obtuse Marginal Branch  Vessel is small in size. The vessel exhibits minimal luminal irregularities.  Right Coronary Artery  Mid RCA lesion 45% stenosed  Mid RCA lesion is 45% stenosed.  Inferior Septal  Vessel is angiographically normal.  Right Posterior Atrioventricular Artery  Vessel is angiographically normal.  First Right Posterolateral Branch  Vessel is angiographically normal.  Second Right Posterolateral Branch  Vessel is angiographically normal.  Intervention  Prox LAD lesion  Stent  Lesion length: 20 mm. CATH LAUNCHER 6FR EBU3.5 guide catheter was inserted. Lesion crossed with guidewire using a GUIDEWIRE PRESS OMNI 185 ST. Pre-stent angioplasty was performed using a BALLOON [place] EUPHORA RX 2.5X15. Maximum pressure: 10 atm. Inflation time: 30 sec. A drug-eluting stent was successfully placed using a STENT RESOLUTE ONYX 2.5X22. Maximum pressure: 14 atm. Inflation time: 20 sec. Post-stent angioplasty was performed using a BALLOON [place] RX 3.0X15. Maximum pressure: 16 atm. Inflation time: 20 sec. The 3.0 noncompliant balloon was inflated to 10 atm distally. There was significant vessel size mismatch in the LAD. There was minimal plaque shift into the ostial diagonal. Thus, I did not have to do kissing balloon angioplasty.  Post-Intervention Lesion Assessment  The intervention was successful. Pre-interventional TIMI flow is 3. Post-intervention TIMI flow is 3. No complications  occurred at this lesion.  There is a 0% residual stenosis post intervention.  Wall Motion  Resting               Left Heart  Left Ventricle The left ventricular size is normal. The left ventricular systolic function is normal. LV end diastolic pressure is normal. The left ventricular ejection fraction is 55-65% by visual estimate. No regional wall motion abnormalities.  Coronary Diagrams  Diagnostic Dominance: Right  Intervention    _____________  Disposition   Pt is being discharged home today in good condition.  Follow-up Plans & Appointments    Follow-up Information    Arvil Chaco, PA-C Follow up on 03/16/2020.   Specialties: Physician Assistant, Cardiology Why: Appointment time 10:30 AM. Please arrive 20 minutes early.  Contact information: [place] [id number] [phone]          Discharge Instructions    AMB Referral to Cardiac Rehabilitation - Phase II   Complete by: As directed    Diagnosis: Coronary Stents   After initial evaluation and assessments completed: Virtual Based Care may be  provided alone or in conjunction with Phase 2 Cardiac Rehab based on patient barriers.: Yes   Diet - low sodium heart healthy   Complete by: As directed    Increase activity slowly   Complete by: As directed       Discharge Medications   Allergies as of 03/07/2020   No Known Allergies     Medication List    STOP taking these medications   omeprazole 20 MG capsule Commonly known as: PRILOSEC Replaced by: pantoprazole 40 MG tablet     TAKE these medications   albuterol 108 (90 Base) MCG/ACT inhaler Commonly known as: VENTOLIN HFA Inhale 2 puffs into the lungs every 6 (six) hours as needed for wheezing or shortness of breath.   aspirin 81 MG chewable tablet Chew 1 tablet (81 mg total) by mouth daily.   atorvastatin 80 MG tablet Commonly known as: LIPITOR TAKE 1 TABLET BY MOUTH EVERY DAY AT 6PM What changed:    how much to take  how to take this  when to take this  additional instructions   clopidogrel 75 MG tablet Commonly known as: PLAVIX Take 1 tablet by mouth once daily   ezetimibe 10 MG tablet Commonly known as: ZETIA Take 1 tablet (10 mg total) by mouth daily.   metoprolol succinate 25 MG 24 hr tablet Commonly known as: TOPROL-XL TAKE 1 TABLET BY MOUTH ONCE DAILY (TAKE  WITH  OR  IMMEDIATELY  FOLLOWING  A  MEAL) What changed:   how much to take  how to take this  when to take this   nitroGLYCERIN 0.4 MG SL tablet Commonly known as: NITROSTAT DISSOLVE ONE TABLET UNDER THE TONGUE EVERY FIVE MINUTES AS NEEDED FOR CHEST PAIN   pantoprazole 40 MG tablet Commonly known as: PROTONIX Take 1 tablet (40 mg total) by mouth daily. Replaces: omeprazole 20 MG capsule         Aspirin prescribed at discharge?  Yes High Intensity Statin Prescribed? (Lipitor 40-54m or Crestor 20-462m: Yes Beta Blocker Prescribed? Yes For EF <40%, was ACEI/ARB Prescribed? No: EF > 40% ADP Receptor Inhibitor Prescribed? (i.e. Plavix etc.-Includes Medically Managed Patients): Yes For EF <40%, Aldosterone Inhibitor Prescribed? No: EF > 40% Was EF assessed during THIS hospitalization? Yes Was Cardiac Rehab II ordered? (Included Medically managed Patients): Yes   Outstanding Labs/Studies   None.  Duration of Discharge Encounter   Greater than 30 minutes including physician time.  Signed, RyRise MuPA-C CH[place]akevilleager: ([phone]/24/2021, 3:23 PM

## 2020-03-07 NOTE — Telephone Encounter (Signed)
Call to patient to review labs.    Pt verbalized understanding and has no further questions at this time.    Advised pt to call for any further questions or concerns.  No further orders.   

## 2020-03-07 NOTE — Telephone Encounter (Signed)
-----   Message from Lennon Alstrom, PA-C sent at 03/07/2020  6:08 AM EDT ----- Please let [name] know that his pre-catheterization labs showed stable hemoglobin / blood counts (he is not anemic). His renal function is stable from that of previous and potassium at goal.   One of his liver enzymes was noted to be slightly elevated (AST 50 with normal range 0-40). He should recheck this lab with his PCP once he is able to establish with one (as he is in between PCPs right now) and given his GI symptoms in the past.    Since addition of Zetia, his cholesterol labs have improved. His total cholesterol is 134 and LDL (bad cholesterol) is 55 and at goal of below 70.   No changes recommended from those discussed at our visit.

## 2020-03-07 NOTE — Discharge Instructions (Signed)
Please review your medication list carefully as there may have been some changes. Should you note any difficulties in obtaining your cardiac medications, please contact our office at [phone].  __________   Radial Site Care  This sheet gives you information about how to care for yourself after your procedure. Your health care provider may also give you more specific instructions. If you have problems or questions, contact your health care provider. What can I expect after the procedure? After the procedure, it is common to have:  Bruising and tenderness at the catheter insertion area. Follow these instructions at home: Medicines  Take over-the-counter and prescription medicines only as told by your health care provider. Insertion site care  Follow instructions from your health care provider about how to take care of your insertion site. Make sure you: ? Wash your hands with soap and water before you change your bandage (dressing). If soap and water are not available, use hand sanitizer. ? Change your dressing as told by your health care provider. ? Leave stitches (sutures), skin glue, or adhesive strips in place. These skin closures may need to stay in place for 2 weeks or longer. If adhesive strip edges start to loosen and curl up, you may trim the loose edges. Do not remove adhesive strips completely unless your health care provider tells you to do that.  Check your insertion site every day for signs of infection. Check for: ? Redness, swelling, or pain. ? Fluid or blood. ? Pus or a bad smell. ? Warmth.  Do not take baths, swim, or use a hot tub until your health care provider approves.  You may shower 24-48 hours after the procedure, or as directed by your health care provider. ? Remove the dressing and gently wash the site with plain soap and water. ? Pat the area dry with a clean towel. ? Do not rub the site. That could cause bleeding.  Do not apply powder or  lotion to the site. Activity   For 24 hours after the procedure, or as directed by your health care provider: ? Do not flex or bend the affected arm. ? Do not push or pull heavy objects with the affected arm. ? Do not drive yourself home from the hospital or clinic. You may drive 24 hours after the procedure unless your health care provider tells you not to. ? Do not operate machinery or power tools.  Do not lift anything that is heavier than 10 lb (4.5 kg), or the limit that you are told, until your health care provider says that it is safe.  Ask your health care provider when it is okay to: ? Return to work or school. ? Resume usual physical activities or sports. ? Resume sexual activity. General instructions  If the catheter site starts to bleed, raise your arm and put firm pressure on the site. If the bleeding does not stop, get help right away. This is a medical emergency.  If you went home on the same day as your procedure, a responsible adult should be with you for the first 24 hours after you arrive home.  Keep all follow-up visits as told by your health care provider. This is important. Contact a health care provider if:  You have a fever.  You have redness, swelling, or yellow drainage around your insertion site. Get help right away if:  You have unusual pain at the radial site.  The catheter insertion area swells very fast.  The  insertion area is bleeding, and the bleeding does not stop when you hold steady pressure on the area.  Your arm or hand becomes pale, cool, tingly, or numb. These symptoms may represent a serious problem that is an emergency. Do not wait to see if the symptoms will go away. Get medical help right away. Call your local emergency services (911 in the [place] Do not drive yourself to the hospital. Summary  After the procedure, it is common to have bruising and tenderness at the site.  Follow instructions from your health care provider about  how to take care of your radial site wound. Check the wound every day for signs of infection.  Do not lift anything that is heavier than 10 lb (4.5 kg), or the limit that you are told, until your health care provider says that it is safe. This information is not intended to replace advice given to you by your health care provider. Make sure you discuss any questions you have with your health care provider. Document Revised: 11/06/2017 Document Reviewed: 11/06/2017 Elsevier Patient Education  2020 [employer] __________   Coronary Artery Disease, Male Coronary artery disease (CAD) is a condition in which the arteries that lead to the heart (coronary arteries) become narrow or blocked. The narrowing or blockage can lead to decreased blood flow to the heart. Prolonged reduced blood flow can cause a heart attack (myocardial infarction or MI). This condition may also be called coronary heart disease. Because CAD is the leading cause of death in men, it is important to understand what causes this condition and how it is treated. What are the causes? CAD is most often caused by atherosclerosis. This is the buildup of fat and cholesterol (plaque) on the inside of the arteries. Over time, the plaque may narrow or block the artery, reducing blood flow to the heart. Plaque can also become weak and break off within a coronary artery and cause a sudden blockage. Other less common causes of CAD include:  A blood clot or a piece of a blood clot or other substance that blocks the flow of blood in a coronary artery (embolism).  A tearing of the artery (spontaneous coronary artery dissection).  An enlargement of an artery (aneurysm).  Inflammation (vasculitis) in the artery wall. What increases the risk? The following factors may make you more likely to develop this condition:  Age. Men over age 98 are at a greater risk of CAD.  Family history of CAD.  Gender. Men often develop CAD earlier in life than  women.  High blood pressure (hypertension).  Diabetes.  High cholesterol levels.  Tobacco use.  Excessive alcohol use.  Lack of exercise.  A diet high in saturated and trans fats, such as fried food and processed meat. Other possible risk factors include:  High stress levels.  Depression.  Obesity.  Sleep apnea. What are the signs or symptoms? Many people do not have any symptoms during the early stages of CAD. As the condition progresses, symptoms may include:  Chest pain (angina). The pain can: ? Feel like crushing or squeezing, or like a tightness, pressure, fullness, or heaviness in the chest. ? Last more than a few minutes or can stop and recur. The pain tends to get worse with exercise or stress and to fade with rest.  Pain in the arms, neck, jaw, ear, or back.  Unexplained heartburn or indigestion.  Shortness of breath.  Nausea or vomiting.  Sudden light-headedness.  Sudden cold sweats.  Fluttering  or fast heartbeat (palpitations). How is this diagnosed? This condition is diagnosed based on:  Your family and medical history.  A physical exam.  Tests, including: ? A test to check the electrical signals in your heart (electrocardiogram). ? Exercise stress test. This looks for signs of blockage when the heart is stressed with exercise, such as running on a treadmill. ? Pharmacologic stress test. This test looks for signs of blockage when the heart is being stressed with a medicine. ? Blood tests. ? Coronary angiogram. This is a procedure to look at the coronary arteries to see if there is any blockage. During this test, a dye is injected into your arteries so they appear on an X-ray. ? Coronary artery CT scan. This CT scan helps detect calcium deposits in your coronary arteries. Calcium deposits are an indicator of CAD. ? A test that uses sound waves to take a picture of your heart (echocardiogram). ? Chest X-ray. How is this treated? This condition  may be treated by:  Healthy lifestyle changes to reduce risk factors.  Medicines such as: ? Antiplatelet medicines and blood-thinning medicines, such as aspirin. These help to prevent blood clots. ? Nitroglycerin. ? Blood pressure medicines. ? Cholesterol-lowering medicine.  Coronary angioplasty and stenting. During this procedure, a thin, flexible tube is inserted through a blood vessel and into a blocked artery. A balloon or similar device on the end of the tube is inflated to open up the artery. In some cases, a small, mesh tube (stent) is inserted into the artery to keep it open.  Coronary artery bypass surgery. During this surgery, veins or arteries from other parts of the body are used to create a bypass around the blockage and allow blood to reach your heart. Follow these instructions at home: Medicines  Take over-the-counter and prescription medicines only as told by your health care provider.  Do not take the following medicines unless your health care provider approves: ? NSAIDs, such as ibuprofen, naproxen, or celecoxib. ? Vitamin supplements that contain vitamin A, vitamin E, or both. Lifestyle  Follow an exercise program approved by your health care provider. Aim for 150 minutes of moderate exercise or 75 minutes of vigorous exercise each week.  Maintain a healthy weight or lose weight as approved by your health care provider.  Learn to manage stress or try to limit your stress. Ask your health care provider for suggestions if you need help.  Get screened for depression and seek treatment, if needed.  Do not use any products that contain nicotine or tobacco, such as cigarettes, e-cigarettes, and chewing tobacco. If you need help quitting, ask your health care provider.  Do not use illegal drugs. Eating and drinking   Follow a heart-healthy diet. A dietitian can help educate you about healthy food options and changes. In general, eat plenty of fruits and vegetables,  lean meats, and whole grains.  Avoid foods high in: ? Sugar. ? Salt (sodium). ? Saturated fat, such as processed or fatty meat. ? Trans fat, such as fried foods.  Use healthy cooking methods such as roasting, grilling, broiling, baking, poaching, steaming, or stir-frying.  Do not drink alcohol if your health care provider tells you not to drink.  If you drink alcohol: ? Limit how much you have to 0-2 drinks per day. ? Be aware of how much alcohol is in your drink. In the [place] one drink equals one 12 oz bottle of beer (355 mL), one 5 oz glass of wine (148 mL),  or one 1 oz glass of hard liquor (44 mL). General instructions  Manage any other health conditions, such as hypertension and diabetes. These conditions affect your heart.  Your health care provider may ask you to monitor your blood pressure. Ideally, your blood pressure should be below 130/80.  Keep all follow-up visits as told by your health care provider. This is important. Get help right away if:  You have pain in your chest, neck, ear, arm, jaw, stomach, or back that: ? Lasts more than a few minutes. ? Is recurring. ? Is not relieved by taking medicine under your tongue (sublingual nitroglycerin).  You have profuse sweating without cause.  You have unexplained: ? Heartburn or indigestion. ? Shortness of breath or difficulty breathing. ? Fluttering or fast heartbeat (palpitations). ? Nausea or vomiting. ? Fatigue. ? Feelings of nervousness or anxiety. ? Weakness. ? Diarrhea.  You have sudden light-headedness or dizziness.  You faint.  You feel like hurting yourself or think about taking your own life. These symptoms may represent a serious problem that is an emergency. Do not wait to see if the symptoms will go away. Get medical help right away. Call your local emergency services (911 in the [place] Do not drive yourself to the hospital. Summary  Coronary artery disease (CAD) is a condition in which the  arteries that lead to the heart (coronary arteries) become narrow or blocked. The narrowing or blockage can lead to a heart attack.  Many people do not have any symptoms during the early stages of CAD.  CAD can be treated with lifestyle changes, medicines, surgery, or a combination of these treatments. This information is not intended to replace advice given to you by your health care provider. Make sure you discuss any questions you have with your health care provider. Document Revised: 06/20/2018 Document Reviewed: 06/10/2018 Elsevier Patient Education  2020 [employer].

## 2020-03-07 NOTE — Interval H&P Note (Signed)
Cath Lab Visit (complete for each Cath Lab visit)  Clinical Evaluation Leading to the Procedure:   ACS: No.  Non-ACS:    Anginal Classification: CCS III  Anti-ischemic medical therapy: Minimal Therapy (1 class of medications)  Non-Invasive Test Results: No non-invasive testing performed  Prior CABG: No previous CABG      History and Physical Interval Note:  03/07/2020 10:24 AM  [name]  has presented today for surgery, with the diagnosis of LT Cath   Unstable angina   CAD.  The various methods of treatment have been discussed with the patient and family. After consideration of risks, benefits and other options for treatment, the patient has consented to  Procedure(s): LEFT HEART CATH AND CORONARY ANGIOGRAPHY (Left) as a surgical intervention.  The patient's history has been reviewed, patient examined, no change in status, stable for surgery.  I have reviewed the patient's chart and labs.  Questions were answered to the patient's satisfaction.     Lorine Bears

## 2020-03-15 ENCOUNTER — Telehealth: Payer: Self-pay | Admitting: Cardiovascular Disease

## 2020-03-15 NOTE — Telephone Encounter (Signed)
Called patient to r/s  Patient upset that we had to reschedule his 6/2 appointment with Leafy Kindle to 6/4 with C Brion Aliment States that he had a procedure done but he is not aware of what was done and no one has been giving him any answers Please call to discuss

## 2020-03-16 ENCOUNTER — Ambulatory Visit: Payer: Medicare Other | Admitting: Physician Assistant

## 2020-03-16 NOTE — Telephone Encounter (Signed)
Spoke with the patient. Patient had questions regarding his recent heart cath and pci Answered the patients questions to be the best of my ability. Patient has additional questions that would be more appropriately answered by the provider. Adv the patient to make a note of his questions/ concerns and bring them with him to his upcoming appt with Ward Givens, NP so that they can be discussed with him.  Patient agreeable with the plan and voiced appreciation for the call back.

## 2020-03-18 ENCOUNTER — Ambulatory Visit (INDEPENDENT_AMBULATORY_CARE_PROVIDER_SITE_OTHER): Payer: Medicare Other | Admitting: Nurse Practitioner

## 2020-03-18 ENCOUNTER — Other Ambulatory Visit: Payer: Self-pay

## 2020-03-18 ENCOUNTER — Encounter: Payer: Self-pay | Admitting: Nurse Practitioner

## 2020-03-18 VITALS — BP 136/86 | HR 77 | Ht 67.0 in | Wt 155.0 lb

## 2020-03-18 DIAGNOSIS — I2510 Atherosclerotic heart disease of native coronary artery without angina pectoris: Secondary | ICD-10-CM

## 2020-03-18 DIAGNOSIS — Z720 Tobacco use: Secondary | ICD-10-CM | POA: Diagnosis not present

## 2020-03-18 DIAGNOSIS — I739 Peripheral vascular disease, unspecified: Secondary | ICD-10-CM

## 2020-03-18 DIAGNOSIS — E785 Hyperlipidemia, unspecified: Secondary | ICD-10-CM | POA: Diagnosis not present

## 2020-03-18 DIAGNOSIS — I200 Unstable angina: Secondary | ICD-10-CM | POA: Diagnosis not present

## 2020-03-18 MED ORDER — BUPROPION HCL ER (SR) 150 MG PO TB12
ORAL_TABLET | ORAL | 2 refills | Status: DC
Start: 2020-03-18 — End: 2020-08-03

## 2020-03-18 NOTE — Patient Instructions (Signed)
Medication Instructions:  1- START Wellbutrin Take 1 tablet (150 mg total) once daily for 3 days, then increase to 1 tablet (150 mg total) twice daily thereafter *If you need a refill on your cardiac medications before your next appointment, please call your pharmacy*   Lab Work: None ordered  If you have labs (blood work) drawn today and your tests are completely normal, you will receive your results only by: Marland Kitchen MyChart Message (if you have MyChart) OR . A paper copy in the mail If you have any lab test that is abnormal or we need to change your treatment, we will call you to review the results.   Testing/Procedures: Both OCT 2021 1- Your physician has requested that you have a lower extremity arterial exercise duplex. During this test, exercise and ultrasound are used to evaluate arterial blood flow in the legs. Allow one hour for this exam. There are no restrictions or special instructions. 2- Your physician has requested that you have an ankle brachial index (ABI). During this test an ultrasound and blood pressure cuff are used to evaluate the arteries that supply the arms and legs with blood. Allow thirty minutes for this exam. There are no restrictions or special instructions.     Follow-Up: At [facility], you and your health needs are our priority.  As part of our continuing mission to provide you with exceptional heart care, we have created designated Provider Care Teams.  These Care Teams include your primary Cardiologist (physician) and Advanced Practice Providers (APPs -  Physician Assistants and Nurse Practitioners) who all work together to provide you with the care you need, when you need it.  We recommend signing up for the patient portal called "MyChart".  Sign up information is provided on this After Visit Summary.  MyChart is used to connect with patients for Virtual Visits (Telemedicine).  Patients are able to view lab/test results, encounter notes, upcoming appointments,  etc.  Non-urgent messages can be sent to your provider as well.   To learn more about what you can do with MyChart, go to [email or web address].    Your next appointment:   4 month(s)  The format for your next appointment:   In Person  Provider:    You may see Lorine Bears, MD or Nicolasa Ducking, NP

## 2020-03-18 NOTE — Progress Notes (Signed)
Office Visit    Patient Name: [name] Date of Encounter: 03/18/2020  Primary Care Provider:  Medicine, [facility] Primary Cardiologist:  Lorine Bears, MD  Chief Complaint    59 year old male with a history of CAD, PAD, hyperlipidemia, COPD, and tobacco abuse, who presents for follow-up after recent PCI of the LAD.  Past Medical History    Past Medical History:  Diagnosis Date  . COPD (chronic obstructive pulmonary disease) (HCC)   . Coronary artery disease 08/2017   a. 08/2017 Anterolateral STEMI/PCI: LAD mod prox dzs, D1 99 (PCI/DES), RCA mod dist dzs, EF nl; b. 02/2020 PCI: LM nl, LAD 70 (iFR abnl - 0.86 ->2.5x22 Resolute Onyx DES), D1 10ost in-stent, LCX mild dzs, OM1/3 min irres, RCA 19m. EF 55-65%.  . Diastolic dysfunction    a. 09/2017 Echo: EF 55-60%, severe mid antlat HK. Gr1 DD. Nl RV fxn. PASP .  . Emphysema lung (HCC)   . Hyperlipidemia   . Medical history non-contributory   . PAD (peripheral artery disease) (HCC)    a. Status post aortobifemoral bypass in 2015; b. 07/2019 ABI: R 1.02, L 1.12.  . Tobacco use    Past Surgical History:  Procedure Laterality Date  . BYPASS GRAFT     in both lower limbs  . CORONARY STENT INTERVENTION N/A 03/07/2020   Procedure: CORONARY STENT INTERVENTION;  Surgeon: Iran Ouch, MD;  Location: [facility] INVASIVE CV LAB;  Service: Cardiovascular;  Laterality: N/A;  LAD  . CORONARY/GRAFT ACUTE MI REVASCULARIZATION N/A 09/13/2017   Procedure: Coronary/Graft Acute MI Revascularization;  Surgeon: Iran Ouch, MD;  Location: [facility] INVASIVE CV [facility];  Service: Cardiovascular;  Laterality: N/A;  . LEFT HEART CATH AND CORONARY ANGIOGRAPHY N/A 09/13/2017   Procedure: LEFT HEART CATH AND CORONARY ANGIOGRAPHY;  Surgeon: Iran Ouch, MD;  Location: [facility] INVASIVE CV LAB;  Service: Cardiovascular;  Laterality: N/A;  . LEFT HEART CATH AND CORONARY ANGIOGRAPHY Left 03/07/2020   Procedure: LEFT HEART CATH  AND CORONARY ANGIOGRAPHY;  Surgeon: Iran Ouch, MD;  Location: [facility] INVASIVE CV LAB;  Service: Cardiovascular;  Laterality: Left;    Allergies  No Known Allergies  History of Present Illness    67 a male with above past medical history including CAD, PAD status post aortobifemoral bypass in 2015, hyperlipidemia, COPD, and tobacco abuse.  He is status post anterolateral STEMI in November 2018 with finding of subtotal occlusion of the large first diagonal branch and moderate proximal LAD disease.  He underwent successful drug-eluting stent placement to the first diagonal.  Of note, drug screen was positive for cocaine at that time.  He was having chest discomfort dating back to October 2020 but declined cardiac catheterization due to the COVID-19 pandemic.  He was subsequently seen via telemedicine visit in April with ongoing intermittent angina with recommendation for catheterization.  He was willing to proceed was seen in clinic on May 21 to arrange for cath.  Diagnostic catheterization was performed May 24 revealing 70% proximal LAD disease with an IFR of 0.86.  The diagonal stent was patent.  He continues to have moderate, nonobstructive mid RCA disease (45%).  The LAD was successfully treated with a 2.5 x 22 mm resolute Onyx drug-eluting stent.  He remained in the hospital overnight was subsequently discharged home the following day.  Since discharge, he has noticed improvement in exertional symptoms and has not been experiencing chest pain throughout the day.  He sometimes notes a brief fluttering in  his chest when he first awakes in the morning but this lasts 1 second and resolves quickly.  It only happened a few times since the hospitalization, and has not occurred throughout the day.  He has chronic dyspnea on exertion in the setting of ongoing tobacco abuse.  He notes that since April, he has cut back on his smoking and has been rolling his own, in order to cut out on unnecessary additives.   He still uses a filter.  He is interested in trying Wellbutrin if Medicaid will cover it.  He has been having some right upper arm and forearm pain that is worse with palpation and certain movements.  He had something similar to this several years ago when using a hammer.  He is not sure which analgesics it safe for him to take.  He denies PND, orthopnea, dizziness, syncope, edema, or early satiety.  Home Medications    Prior to Admission medications   Medication Sig Start Date End Date Taking? Authorizing Provider  albuterol (PROVENTIL HFA;VENTOLIN HFA) 108 (90 Base) MCG/ACT inhaler Inhale 2 puffs into the lungs every 6 (six) hours as needed for wheezing or shortness of breath. 12/31/18  Yes Alfred Levins, [place], MD  aspirin 81 MG chewable tablet Chew 1 tablet (81 mg total) by mouth daily. 09/16/17  Yes Vaughan Basta, MD  atorvastatin (LIPITOR) 80 MG tablet TAKE 1 TABLET BY MOUTH EVERY DAY AT 6PM Patient taking differently: Take 80 mg by mouth daily.  07/22/18  Yes Wellington Hampshire, MD  clopidogrel (PLAVIX) 75 MG tablet Take 1 tablet by mouth once daily 07/27/19  Yes Wellington Hampshire, MD  ezetimibe (ZETIA) 10 MG tablet Take 1 tablet (10 mg total) by mouth daily. 06/24/19 11/24/28 Yes Dunn, Areta Haber, PA-C  metoprolol succinate (TOPROL-XL) 25 MG 24 hr tablet TAKE 1 TABLET BY MOUTH ONCE DAILY (TAKE  WITH  OR  IMMEDIATELY  FOLLOWING  A  MEAL) Patient taking differently: Take 25 mg by mouth daily. TAKE 1 TABLET BY MOUTH ONCE DAILY (TAKE  WITH  OR  IMMEDIATELY  FOLLOWING  A  MEAL) 02/22/20  Yes Wellington Hampshire, MD  nitroGLYCERIN (NITROSTAT) 0.4 MG SL tablet DISSOLVE ONE TABLET UNDER THE TONGUE EVERY FIVE MINUTES AS NEEDED FOR CHEST PAIN 11/03/19  Yes Wellington Hampshire, MD  pantoprazole (PROTONIX) 40 MG tablet Take 1 tablet (40 mg total) by mouth daily. 03/07/20  Yes Dunn, Areta Haber, PA-C    Review of Systems    Rare, brief fluttering in his chest occurring only in the mornings when he first wakes up.   No chest pain.  He has chronic dyspnea on exertion which has been stable.  He denies PND, orthopnea, dizziness, syncope, edema, or early satiety.  He has had some right arm pain that is worse with position changes and palpation.  All other systems reviewed and are otherwise negative except as noted above.  Physical Exam    VS:  BP 136/86 (BP Location: Left Arm, Patient Position: Sitting, Cuff Size: Normal)   Pulse 77   Ht 5\' 7"  (1.702 m)   Wt 155 lb (70.3 kg)   SpO2 97%   BMI 24.28 kg/m  , BMI Body mass index is 24.28 kg/m. GEN: Well nourished, well developed, in no acute distress. HEENT: normal. Neck: Supple, no JVD, carotid bruits, or masses. Cardiac: RRR, no murmurs, rubs, or gallops. No clubbing, cyanosis, edema.  Radials 2+ bilaterally.  Weak lower extremity distal pulses bilaterally.  Right radial catheterization site  is without bleeding, bruit, or hematoma. Respiratory:  Respirations regular and unlabored, scattered rhonchi. GI: Soft, nontender, nondistended, BS + x 4. MS: no deformity or atrophy.  Tenderness of right tricep and shoulder.  Discomfort worsens with rotation, abduction, and abduction of the shoulder. Skin: warm and dry, no rash. Neuro:  Strength and sensation are intact. Psych: Normal affect.  Accessory Clinical Findings    ECG personally reviewed by me today -regular sinus rhythm, 77, anterior T wave inversion- no acute changes.  Lab Results  Component Value Date   WBC 7.7 03/04/2020   HGB 14.3 03/04/2020   HCT 41.2 03/04/2020   MCV 86 03/04/2020   PLT 295 03/04/2020   Lab Results  Component Value Date   CREATININE 0.71 (L) 03/04/2020   BUN 3 (L) 03/04/2020   NA 136 03/04/2020   K 4.6 03/04/2020   CL 97 03/04/2020   CO2 22 03/04/2020   Lab Results  Component Value Date   ALT 37 03/04/2020   AST 50 (H) 03/04/2020   ALKPHOS 80 03/04/2020   BILITOT 0.4 03/04/2020   Lab Results  Component Value Date   CHOL 134 03/04/2020   HDL 65 03/04/2020    LDLCALC 54 03/04/2020   LDLDIRECT 55 03/04/2020   TRIG 75 03/04/2020   CHOLHDL 2.1 03/04/2020    Lab Results  Component Value Date   HGBA1C 5.5 09/13/2017    Assessment & Plan    1.  Coronary artery disease: Status post recent diagnostic catheterization in the setting of unstable angina revealing severe proximal LAD disease which was treated with a drug-eluting stent.  Diagonal stent was patent.  He has residual, moderate nonobstructive RCA disease.  He has had symptomatic improvement since his diagnostic catheterization and no longer experiences exertional chest pain.  His chronic dyspnea in the setting of COPD and ongoing tobacco abuse.  He remains on aspirin, statin, Plavix, Zetia, and beta-blocker therapy and reports good compliance.  He will consider cardiac rehab.  2.  Right shoulder/tricep pain: This has been going on since his hospitalization.  The catheterization site is without bleeding, bruit, or hematoma with good pulses.  His shoulder and tricep are tender and I suspect this is musculoskeletal in origin.  He has not been using any analgesics as he was not sure what he was allowed to take.  I recommend he may use Tylenol and advised that he should avoid NSAIDs in the setting of ongoing aspirin Plavix therapy.  3.  Hyperlipidemia: LDL of 55 on statin therapy.  ALT was normal in May.  4.  Ongoing tobacco abuse/COPD: Continues to smoke and is interested in quitting.  He cannot afford Chantix.  I will send in a prescription for Wellbutrin 150 mg daily x3 days and then twice daily.  We discussed that he should not start the Wellbutrin until he is actually ready to quit.  5.  Peripheral arterial disease: Normal ABIs in October with plan for follow-up this coming October.  Continue aspirin and statin.  He has chronic, stable claudication.  6.  Disposition: Follow-up ABIs in October.  Follow-up in clinic shortly thereafter.  Nicolasa Ducking, NP 03/18/2020, 12:23 PM

## 2020-04-12 ENCOUNTER — Encounter: Payer: Medicare Other | Attending: Cardiovascular Disease | Admitting: *Deleted

## 2020-04-12 ENCOUNTER — Encounter: Payer: Self-pay | Admitting: *Deleted

## 2020-04-12 ENCOUNTER — Other Ambulatory Visit: Payer: Self-pay

## 2020-04-12 DIAGNOSIS — J439 Emphysema, unspecified: Secondary | ICD-10-CM | POA: Insufficient documentation

## 2020-04-12 DIAGNOSIS — I739 Peripheral vascular disease, unspecified: Secondary | ICD-10-CM | POA: Insufficient documentation

## 2020-04-12 DIAGNOSIS — F17210 Nicotine dependence, cigarettes, uncomplicated: Secondary | ICD-10-CM | POA: Insufficient documentation

## 2020-04-12 DIAGNOSIS — Z955 Presence of coronary angioplasty implant and graft: Secondary | ICD-10-CM | POA: Insufficient documentation

## 2020-04-12 DIAGNOSIS — I2510 Atherosclerotic heart disease of native coronary artery without angina pectoris: Secondary | ICD-10-CM | POA: Insufficient documentation

## 2020-04-12 DIAGNOSIS — Z7902 Long term (current) use of antithrombotics/antiplatelets: Secondary | ICD-10-CM | POA: Insufficient documentation

## 2020-04-12 DIAGNOSIS — Z79899 Other long term (current) drug therapy: Secondary | ICD-10-CM | POA: Insufficient documentation

## 2020-04-12 DIAGNOSIS — Z7982 Long term (current) use of aspirin: Secondary | ICD-10-CM | POA: Insufficient documentation

## 2020-04-12 DIAGNOSIS — I252 Old myocardial infarction: Secondary | ICD-10-CM | POA: Insufficient documentation

## 2020-04-12 DIAGNOSIS — E785 Hyperlipidemia, unspecified: Secondary | ICD-10-CM | POA: Insufficient documentation

## 2020-04-12 NOTE — Progress Notes (Signed)
Completed virtual orientation today.  EP evaluation is scheduled for Wed 6/30 at 11am.  Documentation for diagnosis can be found in [facility] encounter 03/07/20.

## 2020-04-13 ENCOUNTER — Encounter: Payer: Medicare Other | Admitting: *Deleted

## 2020-04-13 VITALS — Ht 66.75 in | Wt 157.7 lb

## 2020-04-13 DIAGNOSIS — Z955 Presence of coronary angioplasty implant and graft: Secondary | ICD-10-CM

## 2020-04-13 NOTE — Patient Instructions (Signed)
Patient Instructions  Patient Details  Name: [name] MRN: [id number] Date of Birth: July 20, 1961 Referring Provider:  Iran Ouch, MD  Below are your personal goals for exercise, nutrition, and risk factors. Our goal is to help you stay on track towards obtaining and maintaining these goals. We will be discussing your progress on these goals with you throughout the program.  Initial Exercise Prescription:  Initial Exercise Prescription - 04/13/20 1200      Date of Initial Exercise RX and Referring Provider   Date 04/13/20    Referring Provider Lorine Bears MD      Recumbant Bike   Level 1    RPM 50    Watts 18    Minutes 15    METs 2.8      NuStep   Level 1    SPM 80    Minutes 15    METs 2.8      Arm Ergometer   Level 1    Watts 34    RPM 25    Minutes 15    METs 2.8      Prescription Details   Frequency (times per week) 3    Duration Progress to 30 minutes of continuous aerobic without signs/symptoms of physical distress      Intensity   THRR 40-80% of Max Heartrate 112-146    Ratings of Perceived Exertion 11-13    Perceived Dyspnea 0-4      Progression   Progression Continue to progress workloads to maintain intensity without signs/symptoms of physical distress.      Resistance Training   Training Prescription Yes    Weight 3 lb    Reps 10-15           Exercise Goals: Frequency: Be able to perform aerobic exercise two to three times per week in program working toward 2-5 days per week of home exercise.  Intensity: Work with a perceived exertion of 11 (fairly light) - 15 (hard) while following your exercise prescription.  We will make changes to your prescription with you as you progress through the program.   Duration: Be able to do 30 to 45 minutes of continuous aerobic exercise in addition to a 5 minute warm-up and a 5 minute cool-down routine.   Nutrition Goals: Your personal nutrition goals will be established when you do  your nutrition analysis with the dietician.  The following are general nutrition guidelines to follow: Cholesterol < 200mg /day Sodium < 1500mg /day Fiber: Men over 50 yrs - 30 grams per day  Personal Goals:  Personal Goals and Risk Factors at Admission - 04/13/20 1214      Core Components/Risk Factors/Patient Goals on Admission    Weight Management Yes;Weight Maintenance    Intervention Weight Management: Develop a combined nutrition and exercise program designed to reach desired caloric intake, while maintaining appropriate intake of nutrient and fiber, sodium and fats, and appropriate energy expenditure required for the weight goal.;Weight Management: Provide education and appropriate resources to help participant work on and attain dietary goals.    Admit Weight 157 lb 11.2 oz (71.5 kg)    Goal Weight: Short Term 158 lb (71.7 kg)    Goal Weight: Long Term 158 lb (71.7 kg)    Expected Outcomes Short Term: Continue to assess and modify interventions until short term weight is achieved;Long Term: Adherence to nutrition and physical activity/exercise program aimed toward attainment of established weight goal;Weight Maintenance: Understanding of the daily nutrition guidelines, which includes 25-35% calories from fat,  7% or less cal from saturated fats, less than 200mg  cholesterol, less than 1.5gm of sodium, & 5 or more servings of fruits and vegetables daily    Tobacco Cessation Yes    Number of packs per day 0.5    Intervention Assist the participant in steps to quit. Provide individualized education and counseling about committing to Tobacco Cessation, relapse prevention, and pharmacological support that can be provided by physician.; , assist with locating and accessing local/national Quit Smoking programs, and support quit date choice.    Expected Outcomes Short Term: Will demonstrate readiness to quit, by selecting a quit date.;Short Term: Will quit all tobacco  product use, adhering to prevention of relapse plan.;Long Term: Complete abstinence from all tobacco products for at least 12 months from quit date.    Lipids Yes    Intervention Provide education and support for participant on nutrition & aerobic/resistive exercise along with prescribed medications to achieve LDL 70mg , HDL >40mg .    Expected Outcomes Short Term: Participant states understanding of desired cholesterol values and is compliant with medications prescribed. Participant is following exercise prescription and nutrition guidelines.;Long Term: Cholesterol controlled with medications as prescribed, with individualized exercise RX and with personalized nutrition plan. Value goals: LDL < 70mg , HDL > 40 mg.           Tobacco Use Initial Evaluation: Social History   Tobacco Use  Smoking Status Current Every Day Smoker  . Packs/day: 1.00  . Years: 45.00  . Pack years: 45.00  . Types: Cigarettes  Smokeless Tobacco Never Used  Tobacco Comment   6/29 has started Wellbutrin, but forgets to take at night sometimes, no longer has the desire to smoke but  still has the habit (down to 5-6 cigarettes a day)    Exercise Goals and Review:  Exercise Goals    Row Name 04/13/20 1211             Exercise Goals   Increase Physical Activity Yes       Intervention Provide advice, education, support and counseling about physical activity/exercise needs.;Develop an individualized exercise prescription for aerobic and resistive training based on initial evaluation findings, risk stratification, comorbidities and participant's personal goals.       Expected Outcomes Short Term: Attend rehab on a regular basis to increase amount of physical activity.;Long Term: Add in home exercise to make exercise part of routine and to increase amount of physical activity.;Long Term: Exercising regularly at least 3-5 days a week.       Increase Strength and Stamina Yes       Intervention Provide advice, education,  support and counseling about physical activity/exercise needs.;Develop an individualized exercise prescription for aerobic and resistive training based on initial evaluation findings, risk stratification, comorbidities and participant's personal goals.       Expected Outcomes Short Term: Increase workloads from initial exercise prescription for resistance, speed, and METs.;Short Term: Perform resistance training exercises routinely during rehab and add in resistance training at home;Long Term: Improve cardiorespiratory fitness, muscular endurance and strength as measured by increased METs and functional capacity (04/15/20)       Able to understand and use rate of perceived exertion (RPE) scale Yes       Intervention Provide education and explanation on how to use RPE scale       Expected Outcomes Short Term: Able to use RPE daily in rehab to express subjective intensity level;Long Term:  Able to use RPE to guide intensity level when exercising independently  Able to understand and use Dyspnea scale Yes       Intervention Provide education and explanation on how to use Dyspnea scale       Expected Outcomes Short Term: Able to use Dyspnea scale daily in rehab to express subjective sense of shortness of breath during exertion;Long Term: Able to use Dyspnea scale to guide intensity level when exercising independently       Knowledge and understanding of Target Heart Rate Range (THRR) Yes       Intervention Provide education and explanation of THRR including how the numbers were predicted and where they are located for reference       Expected Outcomes Short Term: Able to state/look up THRR;Short Term: Able to use daily as guideline for intensity in rehab;Long Term: Able to use THRR to govern intensity when exercising independently       Able to check pulse independently Yes       Intervention Provide education and demonstration on how to check pulse in carotid and radial arteries.;Review the importance of  being able to check your own pulse for safety during independent exercise       Expected Outcomes Short Term: Able to explain why pulse checking is important during independent exercise;Long Term: Able to check pulse independently and accurately       Understanding of Exercise Prescription Yes       Intervention Provide education, explanation, and written materials on patient's individual exercise prescription       Expected Outcomes Short Term: Able to explain program exercise prescription;Long Term: Able to explain home exercise prescription to exercise independently              Copy of goals given to participant.

## 2020-04-13 NOTE — Progress Notes (Addendum)
Cardiac Individual Treatment Plan  Patient Details  Name: [name] MRN: [id number] Date of Birth: [birth year] Referring Provider:     Cardiac Rehab from 04/13/2020 in [facility] Cardiac and Pulmonary Rehab  Referring Provider Kathlyn Sacramento MD      Initial Encounter Date:    Cardiac Rehab from 04/13/2020 in [facility] Cardiac and Pulmonary Rehab  Date 04/13/20      Visit Diagnosis: Status post coronary artery stent placement  Patient's Home Medications on Admission:  Current Outpatient Medications:    albuterol (PROVENTIL HFA;VENTOLIN HFA) 108 (90 Base) MCG/ACT inhaler, Inhale 2 puffs into the lungs every 6 (six) hours as needed for wheezing or shortness of breath., Disp: 1 Inhaler, Rfl: 2   aspirin 81 MG chewable tablet, Chew 1 tablet (81 mg total) by mouth daily., Disp: 30 tablet, Rfl: 0   atorvastatin (LIPITOR) 80 MG tablet, TAKE 1 TABLET BY MOUTH EVERY DAY AT 6PM (Patient taking differently: Take 80 mg by mouth daily. ), Disp: 30 tablet, Rfl: 5   buPROPion (WELLBUTRIN SR) 150 MG 12 hr tablet, Take 1 tablet (150 mg total) once daily for 3 days, then increase to 1 tablet (150 mg total) twice daily thereafter., Disp: 60 tablet, Rfl: 2   clopidogrel (PLAVIX) 75 MG tablet, Take 1 tablet by mouth once daily, Disp: 90 tablet, Rfl: 3   ezetimibe (ZETIA) 10 MG tablet, Take 1 tablet (10 mg total) by mouth daily., Disp: 90 tablet, Rfl: 3   metoprolol succinate (TOPROL-XL) 25 MG 24 hr tablet, TAKE 1 TABLET BY MOUTH ONCE DAILY (TAKE  WITH  OR  IMMEDIATELY  FOLLOWING  A  MEAL) (Patient taking differently: Take 25 mg by mouth daily. TAKE 1 TABLET BY MOUTH ONCE DAILY (TAKE  WITH  OR  IMMEDIATELY  FOLLOWING  A  MEAL)), Disp: 90 tablet, Rfl: 0   nitroGLYCERIN (NITROSTAT) 0.4 MG SL tablet, DISSOLVE ONE TABLET UNDER THE TONGUE EVERY FIVE MINUTES AS NEEDED FOR CHEST PAIN, Disp: 25 tablet, Rfl: 0   pantoprazole (PROTONIX) 40 MG tablet, Take 1 tablet (40 mg total) by mouth daily., Disp: 30 tablet,  Rfl: 5  Past Medical History: Past Medical History:  Diagnosis Date   COPD (chronic obstructive pulmonary disease) ([place])    Coronary artery disease 08/2017   a. 08/2017 Anterolateral STEMI/PCI: LAD mod prox dzs, D1 99 (PCI/DES), RCA mod dist dzs, EF nl; b. 02/2020 PCI: LM nl, LAD 70 (iFR abnl - 0.86 ->2.5x22 Resolute Onyx DES), D1 10ost in-stent, LCX mild dzs, OM1/3 min irres, RCA 69m EF 55-65%.   Diastolic dysfunction    a. 09/2017 Echo: EF 55-60%, severe mid antlat HK. Gr1 DD. Nl RV fxn. PASP 237mg.   Em[place]sema lung [place]   Hyperlipidemia    Medical history non-contributory    [place]   a. Status post aortobifemoral bypass in 2015; b. 07/2019 ABI: R 1.02, L 1.12.   Tobacco use     Tobacco Use: Social History   Tobacco Use  Smoking Status Current Every Day Smoker   Packs/day: 1.00   Years: 45.00   Pack years: 45.00   Types: Cigarettes  Smokeless Tobacco Never Used  Tobacco Comment   6/29 has started Wellbutrin, but forgets to take at night sometimes, no longer has the desire to smoke but  still has the habit (down to 5-6 cigarettes a day)    Labs: Recent Review Flowsheet Data    Labs for [facility]P Cardiac and Pulmonary Rehab Latest Ref Rng &  Units 09/13/2017 09/14/2017 12/20/2017 06/23/2019 03/04/2020   Cholestrol 100 - 199 mg/dL 192 171 170 170 134   LDLCALC 0 - 99 mg/dL 100(H) 85 94 80 54   LDLDIRECT 0 - 99 mg/dL - - - 87.4 55   HDL >39 mg/dL 78 68 51 67 65   Trlycerides 0 - 149 mg/dL 68 91 123 113 75   Hemoglobin A1c 4.8 - 5.6 % 5.5 - - - -       Exercise Target Goals: Exercise Program Goal: Individual exercise prescription set using results from initial 6 min walk test and THRR while considering  patients activity barriers and safety.   Exercise Prescription Goal: Initial exercise prescription builds to 30-45 minutes a day of aerobic activity, 2-3 days per week.  Home exercise guidelines will be given to patient during program  as part of exercise prescription that the participant will acknowledge.   Education: Aerobic Exercise & Resistance Training: - Gives group verbal and written instruction on the various components of exercise. Focuses on aerobic and resistive training programs and the benefits of this training and how to safely progress through these programs..   Education: Exercise & Equipment Safety: - Individual verbal instruction and demonstration of equipment use and safety with use of the equipment.   Cardiac Rehab from 04/13/2020 in [facility] Cardiac and Pulmonary Rehab  Date 04/13/20  Educator [facility]  Instruction Review Code 1- Verbalizes Understanding      Education: Exercise Physiology & General Exercise Guidelines: - Group verbal and written instruction with models to review the exercise physiology of the cardiovascular system and associated critical values. Provides general exercise guidelines with specific guidelines to those with heart or lung disease.    Cardiac Rehab from 04/13/2020 in [facility] Cardiac and Pulmonary Rehab  Date 04/13/20  Instruction Review Code 3- Needs Reinforcement  [need identified]      Education: Flexibility, Balance, Mind/Body Relaxation: Provides group verbal/written instruction on the benefits of flexibility and balance training, including mind/body exercise modes such as yoga, pilates and tai chi.  Demonstration and skill practice provided.   Activity Barriers & Risk Stratification:  Activity Barriers & Cardiac Risk Stratification - 04/13/20 1209      Activity Barriers & Cardiac Risk Stratification   Activity Barriers Deconditioning;Muscular Weakness;Shortness of Breath;Balance Concerns;Joint Problems;Other (comment)    Comments blood clot behind right knee, numbness in feet    Cardiac Risk Stratification High           6 Minute Walk:  6 Minute Walk    Row Name 04/13/20 1201         6 Minute Walk   Phase Initial     Distance 740 feet     Walk Time 6 minutes      # of Rest Breaks 0     MPH 1.4     METS 2.8     RPE 11     Perceived Dyspnea  3     VO2 Peak 9.79     Symptoms Yes (comment)     Comments mask difficult to breathe in, SOB, leg/claudication pain 8/10     Resting HR 78 bpm     Resting BP 116/64     Resting Oxygen Saturation  97 %     Exercise Oxygen Saturation  during 6 min walk 96 %     Max Ex. HR 94 bpm     Max Ex. BP 142/74     2 Minute Post BP 138/80  Oxygen Initial Assessment:   Oxygen Re-Evaluation:   Oxygen Discharge (Final Oxygen Re-Evaluation):   Initial Exercise Prescription:  Initial Exercise Prescription - 04/13/20 1200      Date of Initial Exercise RX and Referring Provider   Date 04/13/20    Referring Provider Kathlyn Sacramento MD      Recumbant Bike   Level 1    RPM 50    Watts 18    Minutes 15    METs 2.8      NuStep   Level 1    SPM 80    Minutes 15    METs 2.8      Arm Ergometer   Level 1    Watts 34    RPM 25    Minutes 15    METs 2.8      Prescription Details   Frequency (times per week) 3    Duration Progress to 30 minutes of continuous aerobic without signs/symptoms of physical distress      Intensity   THRR 40-80% of Max Heartrate 112-146    Ratings of Perceived Exertion 11-13    Perceived Dyspnea 0-4      Progression   Progression Continue to progress workloads to maintain intensity without signs/symptoms of physical distress.      Resistance Training   Training Prescription Yes    Weight 3 lb    Reps 10-15           Perform Capillary Blood Glucose checks as needed.  Exercise Prescription Changes:  Exercise Prescription Changes    Row Name 04/13/20 1200             Response to Exercise   Blood Pressure (Admit) 116/64       Blood Pressure (Exercise) 142/74       Blood Pressure (Exit) 138/80       Heart Rate (Admit) 78 bpm       Heart Rate (Exercise) 94 bpm       Oxygen Saturation (Admit) 97 %       Oxygen Saturation (Exercise) 96 %        Rating of Perceived Exertion (Exercise) 11       Perceived Dyspnea (Exercise) 3       Symptoms SOB, mask harder to breathe, leg pain 8/10       Comments walk test results              Exercise Comments:   Exercise Goals and Review:  Exercise Goals    Row Name 04/13/20 1211             Exercise Goals   Increase Physical Activity Yes       Intervention Provide advice, education, support and counseling about physical activity/exercise needs.;Develop an individualized exercise prescription for aerobic and resistive training based on initial evaluation findings, risk stratification, comorbidities and participant's personal goals.       Expected Outcomes Short Term: Attend rehab on a regular basis to increase amount of physical activity.;Long Term: Add in home exercise to make exercise part of routine and to increase amount of physical activity.;Long Term: Exercising regularly at least 3-5 days a week.       Increase Strength and Stamina Yes       Intervention Provide advice, education, support and counseling about physical activity/exercise needs.;Develop an individualized exercise prescription for aerobic and resistive training based on initial evaluation findings, risk stratification, comorbidities and participant's personal goals.       Expected Outcomes  Short Term: Increase workloads from initial exercise prescription for resistance, speed, and METs.;Short Term: Perform resistance training exercises routinely during rehab and add in resistance training at home;Long Term: Improve cardiorespiratory fitness, muscular endurance and strength as measured by increased METs and functional capacity (6MWT)       Able to understand and use rate of perceived exertion (RPE) scale Yes       Intervention Provide education and explanation on how to use RPE scale       Expected Outcomes Short Term: Able to use RPE daily in rehab to express subjective intensity level;Long Term:  Able to use RPE to guide  intensity level when exercising independently       Able to understand and use Dyspnea scale Yes       Intervention Provide education and explanation on how to use Dyspnea scale       Expected Outcomes Short Term: Able to use Dyspnea scale daily in rehab to express subjective sense of shortness of breath during exertion;Long Term: Able to use Dyspnea scale to guide intensity level when exercising independently       Knowledge and understanding of Target Heart Rate Range (THRR) Yes       Intervention Provide education and explanation of THRR including how the numbers were predicted and where they are located for reference       Expected Outcomes Short Term: Able to state/look up THRR;Short Term: Able to use daily as guideline for intensity in rehab;Long Term: Able to use THRR to govern intensity when exercising independently       Able to check pulse independently Yes       Intervention Provide education and demonstration on how to check pulse in carotid and radial arteries.;Review the importance of being able to check your own pulse for safety during independent exercise       Expected Outcomes Short Term: Able to explain why pulse checking is important during independent exercise;Long Term: Able to check pulse independently and accurately       Understanding of Exercise Prescription Yes       Intervention Provide education, explanation, and written materials on patient's individual exercise prescription       Expected Outcomes Short Term: Able to explain program exercise prescription;Long Term: Able to explain home exercise prescription to exercise independently              Exercise Goals Re-Evaluation :   Discharge Exercise Prescription (Final Exercise Prescription Changes):  Exercise Prescription Changes - 04/13/20 1200      Response to Exercise   Blood Pressure (Admit) 116/64    Blood Pressure (Exercise) 142/74    Blood Pressure (Exit) 138/80    Heart Rate (Admit) 78 bpm    Heart  Rate (Exercise) 94 bpm    Oxygen Saturation (Admit) 97 %    Oxygen Saturation (Exercise) 96 %    Rating of Perceived Exertion (Exercise) 11    Perceived Dyspnea (Exercise) 3    Symptoms SOB, mask harder to breathe, leg pain 8/10    Comments walk test results           Nutrition:  Target Goals: Understanding of nutrition guidelines, daily intake of sodium <152m, cholesterol <2011m calories 30% from fat and 7% or less from saturated fats, daily to have 5 or more servings of fruits and vegetables.  Education: Controlling Sodium/Reading Food Labels -Group verbal and written material supporting the discussion of sodium use in heart healthy nutrition. Review and explanation with  models, verbal and written materials for utilization of the food label.   Education: General Nutrition Guidelines/Fats and Fiber: -Group instruction provided by verbal, written material, models and posters to present the general guidelines for heart healthy nutrition. Gives an explanation and review of dietary fats and fiber.   Biometrics:  Pre Biometrics - 04/13/20 1212      Pre Biometrics   Height 5' 6.75" (1.695 m)    Weight 157 lb 11.2 oz (71.5 kg)    BMI (Calculated) 24.9    Single Leg Stand 30 seconds            Nutrition Therapy Plan and Nutrition Goals:   Nutrition Assessments:  Nutrition Assessments - 04/13/20 1213      MEDFICTS Scores   Pre Score 33           MEDIFICTS Score Key:          ?70 Need to make dietary changes          40-70 Heart Healthy Diet         ? 40 Therapeutic Level Cholesterol Diet  Nutrition Goals Re-Evaluation:   Nutrition Goals Discharge (Final Nutrition Goals Re-Evaluation):   Psychosocial: Target Goals: Acknowledge presence or absence of significant depression and/or stress, maximize coping skills, provide positive support system. Participant is able to verbalize types and ability to use techniques and skills needed for reducing stress and  depression.   Education: Depression - Provides group verbal and written instruction on the correlation between heart/lung disease and depressed mood, treatment options, and the stigmas associated with seeking treatment.   Cardiac Rehab from 04/13/2020 in [facility] Cardiac and Pulmonary Rehab  Date 04/13/20  Instruction Review Code 3- Needs Reinforcement  [need identified]      Education: Sleep Hygiene -Provides group verbal and written instruction about how sleep can affect your health.  Define sleep hygiene, discuss sleep cycles and impact of sleep habits. Review good sleep hygiene tips.     Education: Stress and Anxiety: - Provides group verbal and written instruction about the health risks of elevated stress and causes of high stress.  Discuss the correlation between heart/lung disease and anxiety and treatment options. Review healthy ways to manage with stress and anxiety.    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 No   Sister helps with mother, they lean on him for support, very independent   Concerns No support system      Barriers   Psychosocial barriers to participate in program The patient should benefit from training in stress management and relaxation.;Psychosocial barriers identified (see note)      Screening Interventions   Interventions Encouraged to exercise;To provide support and resources with identified psychosocial needs;Provide feedback about the scores to  participant    Expected Outcomes Short Term goal: Utilizing psychosocial counselor, staff and physician to assist with identification of specific Stressors or current issues interfering with healing process. Setting desired goal for each stressor or current issue identified.;Long  Term Goal: Stressors or current issues are controlled or eliminated.;Short Term goal: Identification and review with participant of any Quality of Life or Depression concerns found by scoring the questionnaire.;Long Term goal: The participant improves quality of Life and PHQ9 Scores as seen by post scores and/or verbalization of changes           Quality of Life Scores:   Quality of Life - 04/13/20 1212      Quality of Life   Select Quality of Life      Quality of Life Scores   Health/Function Pre 17.5 %    Socioeconomic Pre 19.75 %    Psych/Spiritual Pre 19.5 %    Family Pre 14.5 %    GLOBAL Pre 18.11 %          Scores of 19 and below usually indicate a poorer quality of life in these areas.  A difference of  2-3 points is a clinically meaningful difference.  A difference of 2-3 points in the total score of the Quality of Life Index has been associated with significant improvement in overall quality of life, self-image, physical symptoms, and general health in studies assessing change in quality of life.  PHQ-9: Recent Review Flowsheet Data    Depression screen [facility] 2/9 04/13/2020   Decreased Interest 2   Down, Depressed, Hopeless 0   PHQ - 2 Score 2   Altered sleeping 3   Tired, decreased energy 2   Change in appetite 2   Feeling bad or failure about yourself  0   Trouble concentrating 0   Moving slowly or fidgety/restless 0   Suicidal thoughts 0   PHQ-9 Score 9   Difficult doing work/chores Somewhat difficult     Interpretation of Total Score  Total Score Depression Severity:  1-4 = Minimal depression, 5-9 = Mild depression, 10-14 = Moderate depression, 15-19 = Moderately severe depression, 20-27  = Severe depression   Psychosocial Evaluation and Intervention:  Psychosocial Evaluation - 04/12/20 1428      Psychosocial Evaluation & Interventions   Interventions Stress management education;Encouraged to exercise with the program and follow exercise prescription    Comments Mr. Scholle is coming into rehab after another stent placement.  He had his first heart event back in 2018 and has been on disability since and then retirement.  He has a history of depression following his divorce in 1993, but recently has been fine.  He is trying to quit smoking and currently on Wellbutrin for it.  He has lost his desire to smoke, but still working on the habit part.  He is a little leary of exercise as his father died during a stress test at [facility] with nurses looking on.  He does want to get stronger and sleep better.  He has noticed that he sleeps best on days that he is active and hopes this will help more. He is very indpendent and does not lean on anyone, but rather they all lean on him.  His mother has dementia and he shares caregiving with his sister.  Last month, she started have seziures and they still have not found the cause.   Exercise will be a good stress relief for him as well.    Expected Outcomes Short: Exercise for stress relief Long: Build confidence for exercise    Continue Psychosocial Services  Follow up required by staff           Psychosocial Re-Evaluation:   Psychosocial Discharge (Final Psychosocial  Re-Evaluation):   Vocational Rehabilitation: Provide vocational rehab assistance to qualifying candidates.   Vocational Rehab Evaluation & Intervention:  Vocational Rehab - 04/12/20 1417      Initial Vocational Rehab Evaluation & Intervention   Assessment shows need for Vocational Rehabilitation No           Education: Education Goals: Education classes will be provided on a variety of topics geared toward better understanding of heart health and risk  factor modification. Participant will state understanding/return demonstration of topics presented as noted by education test scores.  Learning Barriers/Preferences:  Learning Barriers/Preferences - 04/12/20 1417      Learning Barriers/Preferences   Learning Barriers Sight;Exercise Concerns   glasses for reading and outside, fearful of treadmill as father died during stress test   Learning Preferences Skilled Demonstration           General Cardiac Education Topics:  AED/CPR: - Group verbal and written instruction with the use of models to demonstrate the basic use of the AED with the basic ABC's of resuscitation.   Anatomy & Physiology of the Heart: - Group verbal and written instruction and models provide basic cardiac anatomy and physiology, with the coronary electrical and arterial systems. Review of Valvular disease and Heart Failure   Cardiac Procedures: - Group verbal and written instruction to review commonly prescribed medications for heart disease. Reviews the medication, class of the drug, and side effects. Includes the steps to properly store meds and maintain the prescription regimen. (beta blockers and nitrates)   Cardiac Medications I: - Group verbal and written instruction to review commonly prescribed medications for heart disease. Reviews the medication, class of the drug, and side effects. Includes the steps to properly store meds and maintain the prescription regimen.   Cardiac Medications II: -Group verbal and written instruction to review commonly prescribed medications for heart disease. Reviews the medication, class of the drug, and side effects. (all other drug classes)   Cardiac Rehab from 04/13/2020 in [facility] Cardiac and Pulmonary Rehab  Date 04/13/20  Instruction Review Code 3- Needs Reinforcement  [need identified]       Go Sex-Intimacy & Heart Disease, Get SMART - Goal Setting: - Group verbal and written instruction through game format to discuss  heart disease and the return to sexual intimacy. Provides group verbal and written material to discuss and apply goal setting through the application of the S.M.A.R.T. Method.   Other Matters of the Heart: - Provides group verbal, written materials and models to describe Stable Angina and Peripheral Artery. Includes description of the disease process and treatment options available to the cardiac patient.   Infection Prevention: - Provides verbal and written material to individual with discussion of infection control including proper hand washing and proper equipment cleaning during exercise session.   Cardiac Rehab from 04/13/2020 in [facility] Cardiac and Pulmonary Rehab  Date 04/13/20  Educator [facility]  Instruction Review Code 1- Verbalizes Understanding      Falls Prevention: - Provides verbal and written material to individual with discussion of falls prevention and safety.   Cardiac Rehab from 04/13/2020 in [facility] Cardiac and Pulmonary Rehab  Date 04/13/20  Educator [facility]  Instruction Review Code 1- Verbalizes Understanding      Other: -Provides group and verbal instruction on various topics (see comments)   Knowledge Questionnaire Score:  Knowledge Questionnaire Score - 04/13/20 1213      Knowledge Questionnaire Score   Pre Score 22/26 Education Focus: Depression, heart failure, exercise  Core Components/Risk Factors/Patient Goals at Admission:  Personal Goals and Risk Factors at Admission - 04/13/20 1214      Core Components/Risk Factors/Patient Goals on Admission    Weight Management Yes;Weight Maintenance    Intervention Weight Management: Develop a combined nutrition and exercise program designed to reach desired caloric intake, while maintaining appropriate intake of nutrient and fiber, sodium and fats, and appropriate energy expenditure required for the weight goal.;Weight Management: Provide education and appropriate resources to help participant work on and attain  dietary goals.    Admit Weight 157 lb 11.2 oz (71.5 kg)    Goal Weight: Short Term 158 lb (71.7 kg)    Goal Weight: Long Term 158 lb (71.7 kg)    Expected Outcomes Short Term: Continue to assess and modify interventions until short term weight is achieved;Long Term: Adherence to nutrition and physical activity/exercise program aimed toward attainment of established weight goal;Weight Maintenance: Understanding of the daily nutrition guidelines, which includes 25-35% calories from fat, 7% or less cal from saturated fats, less than 272m cholesterol, less than 1.5gm of sodium, & 5 or more servings of fruits and vegetables daily    Tobacco Cessation Yes    Number of packs per day 0.5    Intervention Assist the participant in steps to quit. Provide individualized education and counseling about committing to Tobacco Cessation, relapse prevention, and pharmacological support that can be provided by physician.;OAdvice worker assist with locating and accessing local/national Quit Smoking programs, and support quit date choice.    Expected Outcomes Short Term: Will demonstrate readiness to quit, by selecting a quit date.;Short Term: Will quit all tobacco product use, adhering to prevention of relapse plan.;Long Term: Complete abstinence from all tobacco products for at least 12 months from quit date.    Lipids Yes    Intervention Provide education and support for participant on nutrition & aerobic/resistive exercise along with prescribed medications to achieve LDL <755m HDL >4080m   Expected Outcomes Short Term: Participant states understanding of desired cholesterol values and is compliant with medications prescribed. Participant is following exercise prescription and nutrition guidelines.;Long Term: Cholesterol controlled with medications as prescribed, with individualized exercise RX and with personalized nutrition plan. Value goals: LDL < 58m55mDL > 40 mg.            Education:Diabetes - Individual verbal and written instruction to review signs/symptoms of diabetes, desired ranges of glucose level fasting, after meals and with exercise. Acknowledge that pre and post exercise glucose checks will be done for 3 sessions at entry of program.   Education: Know Your Numbers and Risk Factors: -Group verbal and written instruction about important numbers in your health.  Discussion of what are risk factors and how they play a role in the disease process.  Review of Cholesterol, Blood Pressure, Diabetes, and BMI and the role they play in your overall health.   [facility]  Date 04/13/20  Instruction Review Code 3- Needs Reinforcement  [need identified]      Core Components/Risk Factors/Patient Goals Review:    Core Components/Risk Factors/Patient Goals at Discharge (Final Review):    ITP Comments:  ITP Comments    Row Name 04/12/20 1457 04/13/20 1201         ITP Comments Completed virtual orientation today.  EP evaluation is scheduled for Wed 6/30 at 11am.  [facility] Completed 6MWT and gym orientation. Initial ITP created  and sent for review to Dr. Emily Filbert, Medical Director.             Comments: Initial ITP  Lorin is a current tobacco user. Intervention for tobacco cessation was provided at the initial medical review. He was asked about readiness to quit and reported trying to quit current, has reduced to half a pack a day and using Wellbutrin . Patient was advised and educated about tobacco cessation using combination therapy, tobacco cessation classes, quit line, and quit smoking apps. Patient demonstrated understanding of this material. Staff will continue to provide encouragement and follow up with the patient throughout the program.

## 2020-04-20 ENCOUNTER — Other Ambulatory Visit: Payer: Self-pay

## 2020-04-20 ENCOUNTER — Encounter: Payer: Medicare Other | Attending: Cardiovascular Disease | Admitting: *Deleted

## 2020-04-20 DIAGNOSIS — I252 Old myocardial infarction: Secondary | ICD-10-CM | POA: Diagnosis not present

## 2020-04-20 DIAGNOSIS — J439 Emphysema, unspecified: Secondary | ICD-10-CM | POA: Diagnosis not present

## 2020-04-20 DIAGNOSIS — I2510 Atherosclerotic heart disease of native coronary artery without angina pectoris: Secondary | ICD-10-CM | POA: Insufficient documentation

## 2020-04-20 DIAGNOSIS — Z79899 Other long term (current) drug therapy: Secondary | ICD-10-CM | POA: Insufficient documentation

## 2020-04-20 DIAGNOSIS — I739 Peripheral vascular disease, unspecified: Secondary | ICD-10-CM | POA: Insufficient documentation

## 2020-04-20 DIAGNOSIS — Z955 Presence of coronary angioplasty implant and graft: Secondary | ICD-10-CM | POA: Insufficient documentation

## 2020-04-20 DIAGNOSIS — Z7982 Long term (current) use of aspirin: Secondary | ICD-10-CM | POA: Insufficient documentation

## 2020-04-20 DIAGNOSIS — E785 Hyperlipidemia, unspecified: Secondary | ICD-10-CM | POA: Insufficient documentation

## 2020-04-20 DIAGNOSIS — F17210 Nicotine dependence, cigarettes, uncomplicated: Secondary | ICD-10-CM | POA: Diagnosis not present

## 2020-04-20 DIAGNOSIS — Z7902 Long term (current) use of antithrombotics/antiplatelets: Secondary | ICD-10-CM | POA: Diagnosis not present

## 2020-04-20 NOTE — Progress Notes (Signed)
Daily Session Note  Patient Details  Name: [name] MRN: [id number] Date of Birth: [birth year] Referring Provider:     Cardiac Rehab from 04/13/2020 in [facility] Cardiac and Pulmonary Rehab  Referring Provider Kathlyn Sacramento MD      Encounter Date: 04/20/2020  Check In:  Session Check In - 04/20/20 1011      Check-In   Supervising physician immediately available to respond to emergencies See telemetry face sheet for immediately available ER MD    Location [facility]-Cardiac & Pulmonary Rehab    Staff Present Renita Papa, RN BSN;Joseph Hood RCP,RRT,BSRT;Amanda Oletta Darter, [place], ACSM CEP, Exercise Physiologist    Virtual Visit No    Medication changes reported     No    Fall or balance concerns reported    No    Warm-up and Cool-down Performed on first and last piece of equipment    Resistance Training Performed Yes    VAD Patient? No    PAD/SET Patient? No      Pain Assessment   Currently in Pain? No/denies              Social History   Tobacco Use  Smoking Status Current Every Day Smoker  . Packs/day: 1.00  . Years: 45.00  . Pack years: 45.00  . Types: Cigarettes  Smokeless Tobacco Never Used  Tobacco Comment   6/29 has started Wellbutrin, but forgets to take at night sometimes, no longer has the desire to smoke but  still has the habit (down to 5-6 cigarettes a day)    Goals Met:  Independence with exercise equipment Exercise tolerated well No report of cardiac concerns or symptoms Strength training completed today  Goals Unmet:  Not Applicable  Comments: First full day of exercise!  Patient was oriented to gym and equipment including functions, settings, policies, and procedures.  Patient's individual exercise prescription and treatment plan were reviewed.  All starting workloads were established based on the results of the 6 minute walk test done at initial orientation visit.  The plan for exercise progression was also introduced and progression will be  customized based on patient's performance and goals.     Dr. Emily Filbert is Medical Director for [facility] and [facility].

## 2020-04-22 ENCOUNTER — Other Ambulatory Visit: Payer: Self-pay

## 2020-04-22 ENCOUNTER — Encounter: Payer: Medicare Other | Admitting: *Deleted

## 2020-04-22 DIAGNOSIS — Z955 Presence of coronary angioplasty implant and graft: Secondary | ICD-10-CM | POA: Diagnosis not present

## 2020-04-22 NOTE — Progress Notes (Signed)
Daily Session Note  Patient Details  Name: [name] MRN: [id number] Date of Birth: 10/11/1961 Referring Provider:     Cardiac Rehab from 04/13/2020 in [facility] Cardiac and Pulmonary Rehab  Referring Provider Arida, Muhammad MD      Encounter Date: 04/22/2020  Check In:  Session Check In - 04/22/20 0952      Check-In   Supervising physician immediately available to respond to emergencies See telemetry face sheet for immediately available ER MD    Location [facility]-Cardiac & Pulmonary Rehab    Staff Present Mary Jo Abernethy, RN, BSN, MA;Joseph Hood RCP,RRT,BSRT;Jessica Hawkins, MA, RCEP, CCRP, CCET    Medication changes reported     No    Tobacco Cessation No Change    Warm-up and Cool-down Performed on first and last piece of equipment    PAD/SET Patient? No      Pain Assessment   Currently in Pain? No/denies              Social History   Tobacco Use  Smoking Status Current Every Day Smoker  . Packs/day: 1.00  . Years: 45.00  . Pack years: 45.00  . Types: Cigarettes  Smokeless Tobacco Never Used  Tobacco Comment   6/29 has started Wellbutrin, but forgets to take at night sometimes, no longer has the desire to smoke but  still has the habit (down to 5-6 cigarettes a day)    Goals Met:  Independence with exercise equipment Exercise tolerated well No report of cardiac concerns or symptoms Strength training completed today  Goals Unmet:  Not Applicable  Comments: Pt able to follow exercise prescription today without complaint.  Will continue to monitor for progression.   Dr. Mark Miller is Medical Director for [facility] and [facility]. 

## 2020-04-25 ENCOUNTER — Other Ambulatory Visit: Payer: Self-pay

## 2020-04-25 ENCOUNTER — Encounter: Payer: Medicare Other | Admitting: *Deleted

## 2020-04-25 DIAGNOSIS — Z955 Presence of coronary angioplasty implant and graft: Secondary | ICD-10-CM | POA: Diagnosis not present

## 2020-04-25 NOTE — Progress Notes (Signed)
Daily Session Note  Patient Details  Name: [name] MRN: [id number] Date of Birth: Dec 11, 1960 Referring Provider:     Cardiac Rehab from 04/13/2020 in [facility] Cardiac and Pulmonary Rehab  Referring Provider Kathlyn Sacramento MD      Encounter Date: 04/25/2020  Check In:  Session Check In - 04/25/20 1034      Check-In   Supervising physician immediately available to respond to emergencies See telemetry face sheet for immediately available ER MD    Location [facility]-Cardiac & Pulmonary Rehab    Staff Present Heath Lark, RN, BSN, Laveda Norman, BS, ACSM CEP, Exercise Physiologist;Joseph Tessie Fass RCP,RRT,BSRT    Virtual Visit No    Medication changes reported     No    Fall or balance concerns reported    No    Warm-up and Cool-down Performed on first and last piece of equipment    Resistance Training Performed Yes    VAD Patient? No    PAD/SET Patient? No      Pain Assessment   Currently in Pain? No/denies              Social History   Tobacco Use  Smoking Status Current Every Day Smoker  . Packs/day: 1.00  . Years: 45.00  . Pack years: 45.00  . Types: Cigarettes  Smokeless Tobacco Never Used  Tobacco Comment   6/29 has started Wellbutrin, but forgets to take at night sometimes, no longer has the desire to smoke but  still has the habit (down to 5-6 cigarettes a day)    Goals Met:  Independence with exercise equipment Exercise tolerated well No report of cardiac concerns or symptoms  Goals Unmet:  Not Applicable  Comments: Pt able to follow exercise prescription today without complaint.  Will continue to monitor for progression.    Dr. Emily Filbert is Medical Director for [facility] and [facility].

## 2020-04-27 ENCOUNTER — Encounter: Payer: Self-pay | Admitting: *Deleted

## 2020-04-27 DIAGNOSIS — Z955 Presence of coronary angioplasty implant and graft: Secondary | ICD-10-CM

## 2020-04-27 NOTE — Progress Notes (Signed)
Cardiac Individual Treatment Plan  Patient Details  Name: Halley Kincer MRN: [id number] Date of Birth: 1961-10-07 Referring Provider:     Cardiac Rehab from 04/13/2020 in [facility] Cardiac and Pulmonary Rehab  Referring Provider Kathlyn Sacramento MD      Initial Encounter Date:    Cardiac Rehab from 04/13/2020 in [facility] Cardiac and Pulmonary Rehab  Date 04/13/20      Visit Diagnosis: Status post coronary artery stent placement  Patient's Home Medications on Admission:  Current Outpatient Medications:  .  albuterol (PROVENTIL HFA;VENTOLIN HFA) 108 (90 Base) MCG/ACT inhaler, Inhale 2 puffs into the lungs every 6 (six) hours as needed for wheezing or shortness of breath., Disp: 1 Inhaler, Rfl: 2 .  aspirin 81 MG chewable tablet, Chew 1 tablet (81 mg total) by mouth daily., Disp: 30 tablet, Rfl: 0 .  atorvastatin (LIPITOR) 80 MG tablet, TAKE 1 TABLET BY MOUTH EVERY DAY AT 6PM (Patient taking differently: Take 80 mg by mouth daily. ), Disp: 30 tablet, Rfl: 5 .  buPROPion (WELLBUTRIN SR) 150 MG 12 hr tablet, Take 1 tablet (150 mg total) once daily for 3 days, then increase to 1 tablet (150 mg total) twice daily thereafter., Disp: 60 tablet, Rfl: 2 .  clopidogrel (PLAVIX) 75 MG tablet, Take 1 tablet by mouth once daily, Disp: 90 tablet, Rfl: 3 .  ezetimibe (ZETIA) 10 MG tablet, Take 1 tablet (10 mg total) by mouth daily., Disp: 90 tablet, Rfl: 3 .  metoprolol succinate (TOPROL-XL) 25 MG 24 hr tablet, TAKE 1 TABLET BY MOUTH ONCE DAILY (TAKE  WITH  OR  IMMEDIATELY  FOLLOWING  A  MEAL) (Patient taking differently: Take 25 mg by mouth daily. TAKE 1 TABLET BY MOUTH ONCE DAILY (TAKE  WITH  OR  IMMEDIATELY  FOLLOWING  A  MEAL)), Disp: 90 tablet, Rfl: 0 .  nitroGLYCERIN (NITROSTAT) 0.4 MG SL tablet, DISSOLVE ONE TABLET UNDER THE TONGUE EVERY FIVE MINUTES AS NEEDED FOR CHEST PAIN, Disp: 25 tablet, Rfl: 0 .  pantoprazole (PROTONIX) 40 MG tablet, Take 1 tablet (40 mg total) by mouth daily., Disp: 30 tablet,  Rfl: 5  Past Medical History: Past Medical History:  Diagnosis Date  . COPD (chronic obstructive pulmonary disease) ([place])   . Coronary artery disease 08/2017   a. 08/2017 Anterolateral STEMI/PCI: LAD mod prox dzs, D1 99 (PCI/DES), RCA mod dist dzs, EF nl; b. 02/2020 PCI: LM nl, LAD 70 (iFR abnl - 0.86 ->2.5x22 Resolute Onyx DES), D1 10ost in-stent, LCX mild dzs, OM1/3 min irres, RCA 82m EF 55-65%.  . Diastolic dysfunction    a. 09/2017 Echo: EF 55-60%, severe mid antlat HK. Gr1 DD. Nl RV fxn. PASP 257mg.  . [place]  . Hyperlipidemia   . Medical history non-contributory   . [place]   a. Status post aortobifemoral bypass in 2015; b. 07/2019 ABI: R 1.02, L 1.12.  . Tobacco use     Tobacco Use: Social History   Tobacco Use  Smoking Status Current Every Day Smoker  . Packs/day: 1.00  . Years: 45.00  . Pack years: 45.00  . Types: Cigarettes  Smokeless Tobacco Never Used  Tobacco Comment   6/29 has started Wellbutrin, but forgets to take at night sometimes, no longer has the desire to smoke but  still has the habit (down to 5-6 cigarettes a day)    Labs: Recent Review Flowsheet Data    Labs for ITP Cardiac and Pulmonary Rehab Latest Ref Rng &  Units 09/13/2017 09/14/2017 12/20/2017 06/23/2019 03/04/2020   Cholestrol 100 - 199 mg/dL 192 171 170 170 134   LDLCALC 0 - 99 mg/dL 100(H) 85 94 80 54   LDLDIRECT 0 - 99 mg/dL - - - 87.4 55   HDL >39 mg/dL 78 68 51 67 65   Trlycerides 0 - 149 mg/dL 68 91 123 113 75   Hemoglobin A1c 4.8 - 5.6 % 5.5 - - - -       Exercise Target Goals: Exercise Program Goal: Individual exercise prescription set using results from initial 6 min walk test and THRR while considering  patient's activity barriers and safety.   Exercise Prescription Goal: Initial exercise prescription builds to 30-45 minutes a day of aerobic activity, 2-3 days per week.  Home exercise guidelines will be given to patient during program  as part of exercise prescription that the participant will acknowledge.   Education: Aerobic Exercise & Resistance Training: - Gives group verbal and written instruction on the various components of exercise. Focuses on aerobic and resistive training programs and the benefits of this training and how to safely progress through these programs..   Education: Exercise & Equipment Safety: - Individual verbal instruction and demonstration of equipment use and safety with use of the equipment.   Cardiac Rehab from 04/13/2020 in [facility] Cardiac and Pulmonary Rehab  Date 04/13/20  Educator [facility]  Instruction Review Code 1- Verbalizes Understanding      Education: Exercise Physiology & General Exercise Guidelines: - Group verbal and written instruction with models to review the exercise physiology of the cardiovascular system and associated critical values. Provides general exercise guidelines with specific guidelines to those with heart or lung disease.    Cardiac Rehab from 04/13/2020 in [facility] Cardiac and Pulmonary Rehab  Date 04/13/20  Instruction Review Code 3- Needs Reinforcement  [need identified]      Education: Flexibility, Balance, Mind/Body Relaxation: Provides group verbal/written instruction on the benefits of flexibility and balance training, including mind/body exercise modes such as yoga, pilates and tai chi.  Demonstration and skill practice provided.   Activity Barriers & Risk Stratification:  Activity Barriers & Cardiac Risk Stratification - 04/13/20 1209      Activity Barriers & Cardiac Risk Stratification   Activity Barriers Deconditioning;Muscular Weakness;Shortness of Breath;Balance Concerns;Joint Problems;Other (comment)    Comments blood clot behind right knee, numbness in feet    Cardiac Risk Stratification High           6 Minute Walk:  6 Minute Walk    Row Name 04/13/20 1201         6 Minute Walk   Phase Initial     Distance 740 feet     Walk Time 6 minutes      # of Rest Breaks 0     MPH 1.4     METS 2.8     RPE 11     Perceived Dyspnea  3     VO2 Peak 9.79     Symptoms Yes (comment)     Comments mask difficult to breathe in, SOB, leg/claudication pain 8/10     Resting HR 78 bpm     Resting BP 116/64     Resting Oxygen Saturation  97 %     Exercise Oxygen Saturation  during 6 min walk 96 %     Max Ex. HR 94 bpm     Max Ex. BP 142/74     2 Minute Post BP 138/80  Oxygen Initial Assessment:   Oxygen Re-Evaluation:   Oxygen Discharge (Final Oxygen Re-Evaluation):   Initial Exercise Prescription:  Initial Exercise Prescription - 04/13/20 1200      Date of Initial Exercise RX and Referring Provider   Date 04/13/20    Referring Provider Kathlyn Sacramento MD      Recumbant Bike   Level 1    RPM 50    Watts 18    Minutes 15    METs 2.8      NuStep   Level 1    SPM 80    Minutes 15    METs 2.8      Arm Ergometer   Level 1    Watts 34    RPM 25    Minutes 15    METs 2.8      Prescription Details   Frequency (times per week) 3    Duration Progress to 30 minutes of continuous aerobic without signs/symptoms of physical distress      Intensity   THRR 40-80% of Max Heartrate 112-146    Ratings of Perceived Exertion 11-13    Perceived Dyspnea 0-4      Progression   Progression Continue to progress workloads to maintain intensity without signs/symptoms of physical distress.      Resistance Training   Training Prescription Yes    Weight 3 lb    Reps 10-15           Perform Capillary Blood Glucose checks as needed.  Exercise Prescription Changes:  Exercise Prescription Changes    Row Name 04/13/20 1200             Response to Exercise   Blood Pressure (Admit) 116/64       Blood Pressure (Exercise) 142/74       Blood Pressure (Exit) 138/80       Heart Rate (Admit) 78 bpm       Heart Rate (Exercise) 94 bpm       Oxygen Saturation (Admit) 97 %       Oxygen Saturation (Exercise) 96 %        Rating of Perceived Exertion (Exercise) 11       Perceived Dyspnea (Exercise) 3       Symptoms SOB, mask harder to breathe, leg pain 8/10       Comments walk test results              Exercise Comments:   Exercise Goals and Review:  Exercise Goals    Row Name 04/13/20 1211             Exercise Goals   Increase Physical Activity Yes       Intervention Provide advice, education, support and counseling about physical activity/exercise needs.;Develop an individualized exercise prescription for aerobic and resistive training based on initial evaluation findings, risk stratification, comorbidities and participant's personal goals.       Expected Outcomes Short Term: Attend rehab on a regular basis to increase amount of physical activity.;Long Term: Add in home exercise to make exercise part of routine and to increase amount of physical activity.;Long Term: Exercising regularly at least 3-5 days a week.       Increase Strength and Stamina Yes       Intervention Provide advice, education, support and counseling about physical activity/exercise needs.;Develop an individualized exercise prescription for aerobic and resistive training based on initial evaluation findings, risk stratification, comorbidities and participant's personal goals.       Expected Outcomes  Short Term: Increase workloads from initial exercise prescription for resistance, speed, and METs.;Short Term: Perform resistance training exercises routinely during rehab and add in resistance training at home;Long Term: Improve cardiorespiratory fitness, muscular endurance and strength as measured by increased METs and functional capacity (6MWT)       Able to understand and use rate of perceived exertion (RPE) scale Yes       Intervention Provide education and explanation on how to use RPE scale       Expected Outcomes Short Term: Able to use RPE daily in rehab to express subjective intensity level;Long Term:  Able to use RPE to guide  intensity level when exercising independently       Able to understand and use Dyspnea scale Yes       Intervention Provide education and explanation on how to use Dyspnea scale       Expected Outcomes Short Term: Able to use Dyspnea scale daily in rehab to express subjective sense of shortness of breath during exertion;Long Term: Able to use Dyspnea scale to guide intensity level when exercising independently       Knowledge and understanding of Target Heart Rate Range (THRR) Yes       Intervention Provide education and explanation of THRR including how the numbers were predicted and where they are located for reference       Expected Outcomes Short Term: Able to state/look up THRR;Short Term: Able to use daily as guideline for intensity in rehab;Long Term: Able to use THRR to govern intensity when exercising independently       Able to check pulse independently Yes       Intervention Provide education and demonstration on how to check pulse in carotid and radial arteries.;Review the importance of being able to check your own pulse for safety during independent exercise       Expected Outcomes Short Term: Able to explain why pulse checking is important during independent exercise;Long Term: Able to check pulse independently and accurately       Understanding of Exercise Prescription Yes       Intervention Provide education, explanation, and written materials on patient's individual exercise prescription       Expected Outcomes Short Term: Able to explain program exercise prescription;Long Term: Able to explain home exercise prescription to exercise independently              Exercise Goals Re-Evaluation :  Exercise Goals Re-Evaluation    Row Name 04/20/20 1017             Exercise Goal Re-Evaluation   Exercise Goals Review Increase Physical Activity;Able to understand and use rate of perceived exertion (RPE) scale;Knowledge and understanding of Target Heart Rate Range (THRR);Understanding  of Exercise Prescription;Increase Strength and Stamina;Able to check pulse independently       Comments Reviewed RPE and dyspnea scales, THR and program prescription with pt today.  Pt voiced understanding and was given a copy of goals to take home.       Expected Outcomes Short: Use RPE daily to regulate intensity. Long: Follow program prescription in THR.              Discharge Exercise Prescription (Final Exercise Prescription Changes):  Exercise Prescription Changes - 04/13/20 1200      Response to Exercise   Blood Pressure (Admit) 116/64    Blood Pressure (Exercise) 142/74    Blood Pressure (Exit) 138/80    Heart Rate (Admit) 78 bpm    Heart  Rate (Exercise) 94 bpm    Oxygen Saturation (Admit) 97 %    Oxygen Saturation (Exercise) 96 %    Rating of Perceived Exertion (Exercise) 11    Perceived Dyspnea (Exercise) 3    Symptoms SOB, mask harder to breathe, leg pain 8/10    Comments walk test results           Nutrition:  Target Goals: Understanding of nutrition guidelines, daily intake of sodium <1551m, cholesterol <2025m calories 30% from fat and 7% or less from saturated fats, daily to have 5 or more servings of fruits and vegetables.  Education: Controlling Sodium/Reading Food Labels -Group verbal and written material supporting the discussion of sodium use in heart healthy nutrition. Review and explanation with models, verbal and written materials for utilization of the food label.   Education: General Nutrition Guidelines/Fats and Fiber: -Group instruction provided by verbal, written material, models and posters to present the general guidelines for heart healthy nutrition. Gives an explanation and review of dietary fats and fiber.   Biometrics:  Pre Biometrics - 04/13/20 1212      Pre Biometrics   Height 5' 6.75" (1.695 m)    Weight 157 lb 11.2 oz (71.5 kg)    BMI (Calculated) 24.9    Single Leg Stand 30 seconds            Nutrition Therapy Plan and  Nutrition Goals:   Nutrition Assessments:  Nutrition Assessments - 04/13/20 1213      MEDFICTS Scores   Pre Score 33           MEDIFICTS Score Key:          ?70 Need to make dietary changes          40-70 Heart Healthy Diet         ? 40 Therapeutic Level Cholesterol Diet  Nutrition Goals Re-Evaluation:   Nutrition Goals Discharge (Final Nutrition Goals Re-Evaluation):   Psychosocial: Target Goals: Acknowledge presence or absence of significant depression and/or stress, maximize coping skills, provide positive support system. Participant is able to verbalize types and ability to use techniques and skills needed for reducing stress and depression.   Education: Depression - Provides group verbal and written instruction on the correlation between heart/lung disease and depressed mood, treatment options, and the stigmas associated with seeking treatment.   [facility]  Date 04/13/20  Instruction Review Code 3- Needs Reinforcement  [need identified]      Education: Sleep Hygiene -Provides group verbal and written instruction about how sleep can affect your health.  Define sleep hygiene, discuss sleep cycles and impact of sleep habits. Review good sleep hygiene tips.     Education: Stress and Anxiety: - Provides group verbal and written instruction about the health risks of elevated stress and causes of high stress.  Discuss the correlation between heart/lung disease and anxiety and treatment options. Review healthy ways to manage with stress and anxiety.    Initial Review & Psychosocial Screening:  Initial Psych Review & Screening - 04/12/20 1417      Initial Review   Current issues with History of Depression;Current Stress Concerns;Current Sleep Concerns    Source of Stress Concerns Chronic Illness;Retirement/disability;Family    Comments Mother has dementia and shares caregiving responsibility with sister (1 month at  a time),  Been on disability since 2018 with first MI, Rabbit is eating garden, Does not sleep well at least twice a week only a few hours  Family Dynamics   Good Support System? No   Sister helps with mother, they lean on him for support, very independent   Concerns No support system      Barriers   Psychosocial barriers to participate in program The patient should benefit from training in stress management and relaxation.;Psychosocial barriers identified (see note)      Screening Interventions   Interventions Encouraged to exercise;To provide support and resources with identified psychosocial needs;Provide feedback about the scores to participant    Expected Outcomes Short Term goal: Utilizing psychosocial counselor, staff and physician to assist with identification of specific Stressors or current issues interfering with healing process. Setting desired goal for each stressor or current issue identified.;Long Term Goal: Stressors or current issues are controlled or eliminated.;Short Term goal: Identification and review with participant of any Quality of Life or Depression concerns found by scoring the questionnaire.;Long Term goal: The participant improves quality of Life and PHQ9 Scores as seen by post scores and/or verbalization of changes           Quality of Life Scores:   Quality of Life - 04/13/20 1212      Quality of Life   Select Quality of Life      Quality of Life Scores   Health/Function Pre 17.5 %    Socioeconomic Pre 19.75 %    Psych/Spiritual Pre 19.5 %    Family Pre 14.5 %    GLOBAL Pre 18.11 %          Scores of 19 and below usually indicate a poorer quality of life in these areas.  A difference of  2-3 points is a clinically meaningful difference.  A difference of 2-3 points in the total score of the Quality of Life Index has been associated with significant improvement in overall quality of life, self-image, physical symptoms, and general health in studies  assessing change in quality of life.  PHQ-9: Recent Review Flowsheet Data    Depression screen [facility] 2/9 04/13/2020   Decreased Interest 2   Down, Depressed, Hopeless 0   PHQ - 2 Score 2   Altered sleeping 3   Tired, decreased energy 2   Change in appetite 2   Feeling bad or failure about yourself  0   Trouble concentrating 0   Moving slowly or fidgety/restless 0   Suicidal thoughts 0   PHQ-9 Score 9   Difficult doing work/chores Somewhat difficult     Interpretation of Total Score  Total Score Depression Severity:  1-4 = Minimal depression, 5-9 = Mild depression, 10-14 = Moderate depression, 15-19 = Moderately severe depression, 20-27 = Severe depression   Psychosocial Evaluation and Intervention:  Psychosocial Evaluation - 04/12/20 1428      Psychosocial Evaluation & Interventions   Interventions Stress management education;Encouraged to exercise with the program and follow exercise prescription    Comments Mr. Hanssen is coming into rehab after another stent placement.  He had his first heart event back in 2018 and has been on disability since and then retirement.  He has a history of depression following his divorce in 1993, but recently has been fine.  He is trying to quit smoking and currently on Wellbutrin for it.  He has lost his desire to smoke, but still working on the habit part.  He is a little leary of exercise as his father died during a stress test at [facility] with nurses looking on.  He does want to get stronger and sleep better.  He has noticed that he sleeps best on days that he is active and hopes this will help more. He is very indpendent and does not lean on anyone, but rather they all lean on him.  His mother has dementia and he shares caregiving with his sister.  Last month, she started have seziures and they still have not found the cause.   Exercise will be a good stress relief for him as well.    Expected Outcomes Short: Exercise for stress relief  Long: Build confidence for exercise    Continue Psychosocial Services  Follow up required by staff           Psychosocial Re-Evaluation:   Psychosocial Discharge (Final Psychosocial Re-Evaluation):   Vocational Rehabilitation: Provide vocational rehab assistance to qualifying candidates.   Vocational Rehab Evaluation & Intervention:  Vocational Rehab - 04/12/20 1417      Initial Vocational Rehab Evaluation & Intervention   Assessment shows need for Vocational Rehabilitation No           Education: Education Goals: Education classes will be provided on a variety of topics geared toward better understanding of heart health and risk factor modification. Participant will state understanding/return demonstration of topics presented as noted by education test scores.  Learning Barriers/Preferences:  Learning Barriers/Preferences - 04/12/20 1417      Learning Barriers/Preferences   Learning Barriers Sight;Exercise Concerns   glasses for reading and outside, fearful of treadmill as father died during stress test   Learning Preferences Skilled Demonstration           General Cardiac Education Topics:  AED/CPR: - Group verbal and written instruction with the use of models to demonstrate the basic use of the AED with the basic ABC's of resuscitation.   Anatomy & Physiology of the Heart: - Group verbal and written instruction and models provide basic cardiac anatomy and physiology, with the coronary electrical and arterial systems. Review of Valvular disease and Heart Failure   Cardiac Procedures: - Group verbal and written instruction to review commonly prescribed medications for heart disease. Reviews the medication, class of the drug, and side effects. Includes the steps to properly store meds and maintain the prescription regimen. (beta blockers and nitrates)   Cardiac Medications I: - Group verbal and written instruction to review commonly prescribed medications for  heart disease. Reviews the medication, class of the drug, and side effects. Includes the steps to properly store meds and maintain the prescription regimen.   Cardiac Medications II: -Group verbal and written instruction to review commonly prescribed medications for heart disease. Reviews the medication, class of the drug, and side effects. (all other drug classes)   Cardiac Rehab from 04/13/2020 in [facility] Cardiac and Pulmonary Rehab  Date 04/13/20  Instruction Review Code 3- Needs Reinforcement  [need identified]       Go Sex-Intimacy & Heart Disease, Get SMART - Goal Setting: - Group verbal and written instruction through game format to discuss heart disease and the return to sexual intimacy. Provides group verbal and written material to discuss and apply goal setting through the application of the S.M.A.R.T. Method.   Other Matters of the Heart: - Provides group verbal, written materials and models to describe Stable Angina and Peripheral Artery. Includes description of the disease process and treatment options available to the cardiac patient.   Infection Prevention: - Provides verbal and written material to individual with discussion of infection control including proper hand washing and proper equipment cleaning during exercise session.   Cardiac  Rehab from 04/13/2020 in [facility] Cardiac and Pulmonary Rehab  Date 04/13/20  Educator [facility]  Instruction Review Code 1- Verbalizes Understanding      Falls Prevention: - Provides verbal and written material to individual with discussion of falls prevention and safety.   Cardiac Rehab from 04/13/2020 in [facility] Cardiac and Pulmonary Rehab  Date 04/13/20  Educator [facility]  Instruction Review Code 1- Verbalizes Understanding      Other: -Provides group and verbal instruction on various topics (see comments)   Knowledge Questionnaire Score:  Knowledge Questionnaire Score - 04/13/20 1213      Knowledge Questionnaire Score   Pre Score 22/26  Education Focus: Depression, heart failure, exercise           Core Components/Risk Factors/Patient Goals at Admission:  Personal Goals and Risk Factors at Admission - 04/13/20 1214      Core Components/Risk Factors/Patient Goals on Admission    Weight Management Yes;Weight Maintenance    Intervention Weight Management: Develop a combined nutrition and exercise program designed to reach desired caloric intake, while maintaining appropriate intake of nutrient and fiber, sodium and fats, and appropriate energy expenditure required for the weight goal.;Weight Management: Provide education and appropriate resources to help participant work on and attain dietary goals.    Admit Weight 157 lb 11.2 oz (71.5 kg)    Goal Weight: Short Term 158 lb (71.7 kg)    Goal Weight: Long Term 158 lb (71.7 kg)    Expected Outcomes Short Term: Continue to assess and modify interventions until short term weight is achieved;Long Term: Adherence to nutrition and physical activity/exercise program aimed toward attainment of established weight goal;Weight Maintenance: Understanding of the daily nutrition guidelines, which includes 25-35% calories from fat, 7% or less cal from saturated fats, less than '200mg'$  cholesterol, less than 1.5gm of sodium, & 5 or more servings of fruits and vegetables daily    Tobacco Cessation Yes    Number of packs per day 0.5    Intervention Assist the participant in steps to quit. Provide individualized education and counseling about committing to Tobacco Cessation, relapse prevention, and pharmacological support that can be provided by physician.;Advice worker, assist with locating and accessing local/national Quit Smoking programs, and support quit date choice.    Expected Outcomes Short Term: Will demonstrate readiness to quit, by selecting a quit date.;Short Term: Will quit all tobacco product use, adhering to prevention of relapse plan.;Long Term: Complete abstinence from all  tobacco products for at least 12 months from quit date.    Lipids Yes    Intervention Provide education and support for participant on nutrition & aerobic/resistive exercise along with prescribed medications to achieve LDL '70mg'$ , HDL >'40mg'$ .    Expected Outcomes Short Term: Participant states understanding of desired cholesterol values and is compliant with medications prescribed. Participant is following exercise prescription and nutrition guidelines.;Long Term: Cholesterol controlled with medications as prescribed, with individualized exercise RX and with personalized nutrition plan. Value goals: LDL < '70mg'$ , HDL > 40 mg.           Education:Diabetes - Individual verbal and written instruction to review signs/symptoms of diabetes, desired ranges of glucose level fasting, after meals and with exercise. Acknowledge that pre and post exercise glucose checks will be done for 3 sessions at entry of program.   Education: Know Your Numbers and Risk Factors: -Group verbal and written instruction about important numbers in your health.  Discussion of what are risk factors and how they play a role in the  disease process.  Review of Cholesterol, Blood Pressure, Diabetes, and BMI and the role they play in your overall health.   Cardiac Rehab from 04/13/2020 in [facility] Cardiac and Pulmonary Rehab  Date 04/13/20  Instruction Review Code 3- Needs Reinforcement  [need identified]      Core Components/Risk Factors/Patient Goals Review:    Core Components/Risk Factors/Patient Goals at Discharge (Final Review):    ITP Comments:  ITP Comments    Row Name 04/12/20 1457 04/13/20 1201 04/13/20 1228 04/20/20 1015 04/27/20 1115   ITP Comments Completed virtual orientation today.  EP evaluation is scheduled for Wed 6/30 at 11am.  Documentation for diagnosis can be found in [facility] encounter 03/07/20. Completed 6MWT and gym orientation. Initial ITP created and sent for review to Dr. Emily Filbert, Medical Director. Raciel is  a current tobacco user. Intervention for tobacco cessation was provided at the initial medical review. He was asked about readiness to quit and reported trying to quit current, has reduced to half a pack a day and using Wellbutrin . Patient was advised and educated about tobacco cessation using combination therapy, tobacco cessation classes, quit line, and quit smoking apps. Patient demonstrated understanding of this material. Staff will continue to provide encouragement and follow up with the patient throughout the program. First full day of exercise!  Patient was oriented to gym and equipment including functions, settings, policies, and procedures.  Patient's individual exercise prescription and treatment plan were reviewed.  All starting workloads were established based on the results of the 6 minute walk test done at initial orientation visit.  The plan for exercise progression was also introduced and progression will be customized based on patient's performance and goals 30 Day review completed. Medical Director ITP review done, changes made as directed, and signed approval by Medical Director. New to program          Comments:

## 2020-04-28 ENCOUNTER — Other Ambulatory Visit: Payer: Self-pay | Admitting: Cardiovascular Disease

## 2020-04-29 ENCOUNTER — Other Ambulatory Visit: Payer: Self-pay

## 2020-04-29 DIAGNOSIS — Z955 Presence of coronary angioplasty implant and graft: Secondary | ICD-10-CM

## 2020-04-29 NOTE — Progress Notes (Signed)
Daily Session Note  Patient Details  Name: [name] MRN: [id number] Date of Birth: [birth year] Referring Provider:     Cardiac Rehab from 04/13/2020 in [facility] Cardiac and Pulmonary Rehab  Referring Provider Kathlyn Sacramento MD      Encounter Date: 04/29/2020  Check In:  Session Check In - 04/29/20 1015      Check-In   Supervising physician immediately available to respond to emergencies See telemetry face sheet for immediately available ER MD    Location [facility]-Cardiac & Pulmonary Rehab    Staff Present Basilia Jumbo, RN, BSN;Jessica Luan Pulling, MA, RCEP, CCRP, CCET;Meredith [place], RN BSN    Virtual Visit No    Medication changes reported     No    Fall or balance concerns reported    No    Tobacco Cessation No Change    Warm-up and Cool-down Performed on first and last piece of equipment    Resistance Training Performed Yes    VAD Patient? No    PAD/SET Patient? No      Pain Assessment   Currently in Pain? No/denies              Social History   Tobacco Use  Smoking Status Current Every Day Smoker  . Packs/day: 1.00  . Years: 45.00  . Pack years: 45.00  . Types: Cigarettes  Smokeless Tobacco Never Used  Tobacco Comment   6/29 has started Wellbutrin, but forgets to take at night sometimes, no longer has the desire to smoke but  still has the habit (down to 5-6 cigarettes a day)    Goals Met:  Independence with exercise equipment Exercise tolerated well No report of cardiac concerns or symptoms  Goals Unmet:  Not Applicable  Comments: Pt able to follow exercise prescription today without complaint.  Will continue to monitor for progression.    Dr. Emily Filbert is Medical Director for [place] and [facility].

## 2020-05-02 ENCOUNTER — Other Ambulatory Visit: Payer: Self-pay

## 2020-05-02 ENCOUNTER — Encounter: Payer: Medicare Other | Admitting: *Deleted

## 2020-05-02 DIAGNOSIS — Z955 Presence of coronary angioplasty implant and graft: Secondary | ICD-10-CM

## 2020-05-02 NOTE — Progress Notes (Signed)
Daily Session Note  Patient Details  Name: [name] MRN: [id number] Date of Birth: 09-Mar-1961 Referring Provider:     Cardiac Rehab from 04/13/2020 in [facility] Cardiac and Pulmonary Rehab  Referring Provider Kathlyn Sacramento MD      Encounter Date: 05/02/2020  Check In:  Session Check In - 05/02/20 1001      Check-In   Supervising physician immediately available to respond to emergencies See telemetry face sheet for immediately available ER MD    Location [facility]-Cardiac & Pulmonary Rehab    Staff Present Nyoka Cowden, RN, BSN, [address] [place], BS, ACSM CEP, Exercise Physiologist    Medication changes reported     No    Fall or balance concerns reported    No    Tobacco Cessation Use Decreased    Warm-up and Cool-down Performed on first and last piece of equipment    Resistance Training Performed Yes    PAD/SET Patient? No      Pain Assessment   Currently in Pain? No/denies              Social History   Tobacco Use  Smoking Status Current Every Day Smoker  . Packs/day: 1.00  . Years: 45.00  . Pack years: 45.00  . Types: Cigarettes  Smokeless Tobacco Never Used  Tobacco Comment   6/29 has started Wellbutrin, but forgets to take at night sometimes, no longer has the desire to smoke but  still has the habit (down to 5-6 cigarettes a day)    Goals Met:  Independence with exercise equipment Exercise tolerated well No report of cardiac concerns or symptoms Strength training completed today  Goals Unmet:  Not Applicable  Comments: Reviewed RPE and dyspnea scales, THR and program prescription with pt today.  Pt voiced understanding and was given a copy of goals to take home.    Short: Use RPE daily to regulate intensity. Long: Follow program prescription in THR.   Dr. Emily Filbert is Medical Director for [place] and [facility].

## 2020-05-04 ENCOUNTER — Encounter: Payer: Medicare Other | Admitting: *Deleted

## 2020-05-04 ENCOUNTER — Other Ambulatory Visit: Payer: Self-pay

## 2020-05-04 DIAGNOSIS — Z955 Presence of coronary angioplasty implant and graft: Secondary | ICD-10-CM

## 2020-05-04 NOTE — Progress Notes (Signed)
Daily Session Note  Patient Details  Name: [name] MRN: [id number] Date of Birth: 05-07-1961 Referring Provider:     Cardiac Rehab from 04/13/2020 in [facility] Cardiac and Pulmonary Rehab  Referring Provider Kathlyn Sacramento MD      Encounter Date: 05/04/2020  Check In:  Session Check In - 05/04/20 1010      Check-In   Supervising physician immediately available to respond to emergencies See telemetry face sheet for immediately available ER MD    Location [facility]-Cardiac & Pulmonary Rehab    Staff Present Renita Papa, RN Margurite Auerbach, MS Exercise Physiologist;Amanda Oletta Darter, BA, ACSM CEP, Exercise Physiologist    Virtual Visit No    Medication changes reported     No    Fall or balance concerns reported    No    Warm-up and Cool-down Performed on first and last piece of equipment    Resistance Training Performed Yes    VAD Patient? No    PAD/SET Patient? No      Pain Assessment   Currently in Pain? No/denies              Social History   Tobacco Use  Smoking Status Current Every Day Smoker  . Packs/day: 1.00  . Years: 45.00  . Pack years: 45.00  . Types: Cigarettes  Smokeless Tobacco Never Used  Tobacco Comment   6/29 has started Wellbutrin, but forgets to take at night sometimes, no longer has the desire to smoke but  still has the habit (down to 5-6 cigarettes a day)    Goals Met:  Independence with exercise equipment Exercise tolerated well No report of cardiac concerns or symptoms Strength training completed today  Goals Unmet:  Not Applicable  Comments: Pt able to follow exercise prescription today without complaint.  Will continue to monitor for progression.'    Dr. Emily Filbert is Medical Director for [place] and [facility].

## 2020-05-06 ENCOUNTER — Other Ambulatory Visit: Payer: Self-pay

## 2020-05-06 ENCOUNTER — Encounter: Payer: Medicare Other | Admitting: *Deleted

## 2020-05-06 DIAGNOSIS — Z955 Presence of coronary angioplasty implant and graft: Secondary | ICD-10-CM

## 2020-05-06 NOTE — Progress Notes (Signed)
Daily Session Note  Patient Details  Name: [name] MRN: [id number] Date of Birth: [birth year] Referring Provider:     Cardiac Rehab from 04/13/2020 in [facility] Cardiac and Pulmonary Rehab  Referring Provider Kathlyn Sacramento MD      Encounter Date: 05/06/2020  Check In:  Session Check In - 05/06/20 0958      Check-In   Supervising physician immediately available to respond to emergencies See telemetry face sheet for immediately available ER MD    Location [facility]-Cardiac & Pulmonary Rehab    Staff Present Renita Papa, RN BSN;Jessica Luan Pulling, MA, RCEP, CCRP, CCET;Melissa [place] RDN, LDN    Virtual Visit No    Medication changes reported     No    Fall or balance concerns reported    No    Warm-up and Cool-down Performed on first and last piece of equipment    Resistance Training Performed Yes    VAD Patient? No    PAD/SET Patient? No      Pain Assessment   Currently in Pain? No/denies              Social History   Tobacco Use  Smoking Status Current Every Day Smoker  . Packs/day: 1.00  . Years: 45.00  . Pack years: 45.00  . Types: Cigarettes  Smokeless Tobacco Never Used  Tobacco Comment   6/29 has started Wellbutrin, but forgets to take at night sometimes, no longer has the desire to smoke but  still has the habit (down to 5-6 cigarettes a day)    Goals Met:  Independence with exercise equipment Exercise tolerated well No report of cardiac concerns or symptoms Strength training completed today  Goals Unmet:  Not Applicable  Comments: Pt able to follow exercise prescription today without complaint.  Will continue to monitor for progression.    Dr. Emily Filbert is Medical Director for [place] and [facility].

## 2020-05-09 ENCOUNTER — Encounter: Payer: Medicare Other | Admitting: *Deleted

## 2020-05-09 ENCOUNTER — Other Ambulatory Visit: Payer: Self-pay

## 2020-05-09 DIAGNOSIS — Z955 Presence of coronary angioplasty implant and graft: Secondary | ICD-10-CM | POA: Diagnosis not present

## 2020-05-09 NOTE — Progress Notes (Signed)
Daily Session Note  Patient Details  Name: [name] MRN: [id number] Date of Birth: [birth year] Referring Provider:     Cardiac Rehab from 04/13/2020 in [facility] Cardiac and Pulmonary Rehab  Referring Provider Kathlyn Sacramento MD      Encounter Date: 05/09/2020  Check In:  Session Check In - 05/09/20 1031      Check-In   Supervising physician immediately available to respond to emergencies See telemetry face sheet for immediately available ER MD    Location [facility]-Cardiac & Pulmonary Rehab    Staff Present Renita Papa, RN Moises Blood, BS, ACSM CEP, Exercise Physiologist;Joseph Foy [place], [place], ACSM CEP, Exercise Physiologist    Virtual Visit No    Medication changes reported     No    Fall or balance concerns reported    No    Warm-up and Cool-down Performed on first and last piece of equipment    Resistance Training Performed Yes    VAD Patient? No    PAD/SET Patient? No      Pain Assessment   Currently in Pain? No/denies              Social History   Tobacco Use  Smoking Status Current Every Day Smoker  . Packs/day: 1.00  . Years: 45.00  . Pack years: 45.00  . Types: Cigarettes  Smokeless Tobacco Never Used  Tobacco Comment   6/29 has started Wellbutrin, but forgets to take at night sometimes, no longer has the desire to smoke but  still has the habit (down to 5-6 cigarettes a day)    Goals Met:  Independence with exercise equipment Exercise tolerated well No report of cardiac concerns or symptoms Strength training completed today  Goals Unmet:  Not Applicable  Comments: Pt able to follow exercise prescription today without complaint.  Will continue to monitor for progression.    Dr. Emily Filbert is Medical Director for [place] and [facility].

## 2020-05-11 ENCOUNTER — Encounter: Payer: Medicare Other | Admitting: *Deleted

## 2020-05-11 ENCOUNTER — Other Ambulatory Visit: Payer: Self-pay

## 2020-05-11 DIAGNOSIS — Z955 Presence of coronary angioplasty implant and graft: Secondary | ICD-10-CM | POA: Diagnosis not present

## 2020-05-11 NOTE — Progress Notes (Signed)
Daily Session Note  Patient Details  Name: [name] MRN: [id number] Date of Birth: Dec 27, 1960 Referring Provider:     Cardiac Rehab from 04/13/2020 in [facility] Cardiac and Pulmonary Rehab  Referring Provider Kathlyn Sacramento MD      Encounter Date: 05/11/2020  Check In:  Session Check In - 05/11/20 1000      Check-In   Supervising physician immediately available to respond to emergencies See telemetry face sheet for immediately available ER MD    Location [facility]-Cardiac & Pulmonary Rehab    Staff Present Renita Papa, RN BSN;Jessica Luan Pulling, MA, RCEP, CCRP, CCET;Melissa [place] RDN, [place], [place], ACSM CEP, Exercise Physiologist    Virtual Visit No    Medication changes reported     No    Fall or balance concerns reported    No    Warm-up and Cool-down Performed on first and last piece of equipment    Resistance Training Performed Yes    VAD Patient? No    PAD/SET Patient? No      Pain Assessment   Currently in Pain? No/denies              Social History   Tobacco Use  Smoking Status Current Every Day Smoker  . Packs/day: 1.00  . Years: 45.00  . Pack years: 45.00  . Types: Cigarettes  Smokeless Tobacco Never Used  Tobacco Comment   6/29 has started Wellbutrin, but forgets to take at night sometimes, no longer has the desire to smoke but  still has the habit (down to 5-6 cigarettes a day)    Goals Met:  Independence with exercise equipment Exercise tolerated well No report of cardiac concerns or symptoms Strength training completed today  Goals Unmet:  Not Applicable  Comments: Pt able to follow exercise prescription today without complaint.  Will continue to monitor for progression.    Dr. Emily Filbert is Medical Director for [place] and [facility].

## 2020-05-22 ENCOUNTER — Other Ambulatory Visit: Payer: Self-pay | Admitting: Cardiovascular Disease

## 2020-05-23 ENCOUNTER — Other Ambulatory Visit: Payer: Self-pay

## 2020-05-23 ENCOUNTER — Encounter: Payer: Medicare Other | Attending: Cardiovascular Disease | Admitting: *Deleted

## 2020-05-23 DIAGNOSIS — F17210 Nicotine dependence, cigarettes, uncomplicated: Secondary | ICD-10-CM | POA: Diagnosis not present

## 2020-05-23 DIAGNOSIS — Z7902 Long term (current) use of antithrombotics/antiplatelets: Secondary | ICD-10-CM | POA: Diagnosis not present

## 2020-05-23 DIAGNOSIS — J439 Emphysema, unspecified: Secondary | ICD-10-CM | POA: Insufficient documentation

## 2020-05-23 DIAGNOSIS — Z955 Presence of coronary angioplasty implant and graft: Secondary | ICD-10-CM

## 2020-05-23 DIAGNOSIS — E785 Hyperlipidemia, unspecified: Secondary | ICD-10-CM | POA: Insufficient documentation

## 2020-05-23 DIAGNOSIS — Z7982 Long term (current) use of aspirin: Secondary | ICD-10-CM | POA: Diagnosis not present

## 2020-05-23 DIAGNOSIS — I252 Old myocardial infarction: Secondary | ICD-10-CM | POA: Insufficient documentation

## 2020-05-23 DIAGNOSIS — Z79899 Other long term (current) drug therapy: Secondary | ICD-10-CM | POA: Insufficient documentation

## 2020-05-23 DIAGNOSIS — I2510 Atherosclerotic heart disease of native coronary artery without angina pectoris: Secondary | ICD-10-CM | POA: Diagnosis not present

## 2020-05-23 DIAGNOSIS — I739 Peripheral vascular disease, unspecified: Secondary | ICD-10-CM | POA: Diagnosis not present

## 2020-05-23 NOTE — Progress Notes (Signed)
Daily Session Note  Patient Details  Name: [name] MRN: [id number] Date of Birth: 07/04/1961 Referring Provider:     Cardiac Rehab from 04/13/2020 in [facility] Cardiac and Pulmonary Rehab  Referring Provider Kathlyn Sacramento MD      Encounter Date: 05/23/2020  Check In:  Session Check In - 05/23/20 1034      Check-In   Supervising physician immediately available to respond to emergencies See telemetry face sheet for immediately available ER MD    Location [facility]-Cardiac & Pulmonary Rehab    Staff Present Renita Papa, RN BSN;Joseph [address] [place], [place], ACSM CEP, Exercise Physiologist    Virtual Visit No    Medication changes reported     No    Fall or balance concerns reported    No    Warm-up and Cool-down Performed on first and last piece of equipment    Resistance Training Performed Yes    VAD Patient? No    PAD/SET Patient? No      Pain Assessment   Currently in Pain? No/denies              Social History   Tobacco Use  Smoking Status Current Every Day Smoker  . Packs/day: 1.00  . Years: 45.00  . Pack years: 45.00  . Types: Cigarettes  Smokeless Tobacco Never Used  Tobacco Comment   6/29 has started Wellbutrin, but forgets to take at night sometimes, no longer has the desire to smoke but  still has the habit (down to 5-6 cigarettes a day)    Goals Met:  Independence with exercise equipment Exercise tolerated well No report of cardiac concerns or symptoms Strength training completed today  Goals Unmet:  Not Applicable  Comments: Pt able to follow exercise prescription today without complaint.  Will continue to monitor for progression.    Dr. Emily Filbert is Medical Director for [place] and [facility].

## 2020-05-25 ENCOUNTER — Encounter: Payer: Medicare Other | Admitting: *Deleted

## 2020-05-25 ENCOUNTER — Encounter: Payer: Self-pay | Admitting: *Deleted

## 2020-05-25 ENCOUNTER — Other Ambulatory Visit: Payer: Self-pay

## 2020-05-25 DIAGNOSIS — Z955 Presence of coronary angioplasty implant and graft: Secondary | ICD-10-CM

## 2020-05-25 NOTE — Progress Notes (Signed)
Cardiac Individual Treatment Plan  Patient Details  Name: [name] MRN: [id number] Date of Birth: 1961/04/20 Referring Provider:     Cardiac Rehab from 04/13/2020 in [facility] Cardiac and Pulmonary Rehab  Referring Provider Kathlyn Sacramento MD      Initial Encounter Date:    Cardiac Rehab from 04/13/2020 in [facility] Cardiac and Pulmonary Rehab  Date 04/13/20      Visit Diagnosis: Status post coronary artery stent placement  Patient's Home Medications on Admission:  Current Outpatient Medications:  .  albuterol (PROVENTIL HFA;VENTOLIN HFA) 108 (90 Base) MCG/ACT inhaler, Inhale 2 puffs into the lungs every 6 (six) hours as needed for wheezing or shortness of breath., Disp: 1 Inhaler, Rfl: 2 .  aspirin 81 MG chewable tablet, Chew 1 tablet (81 mg total) by mouth daily., Disp: 30 tablet, Rfl: 0 .  atorvastatin (LIPITOR) 80 MG tablet, TAKE 1 TABLET BY MOUTH ONCE DAILY AT  6PM, Disp: 90 tablet, Rfl: 0 .  buPROPion (WELLBUTRIN SR) 150 MG 12 hr tablet, Take 1 tablet (150 mg total) once daily for 3 days, then increase to 1 tablet (150 mg total) twice daily thereafter., Disp: 60 tablet, Rfl: 2 .  clopidogrel (PLAVIX) 75 MG tablet, Take 1 tablet by mouth once daily, Disp: 90 tablet, Rfl: 3 .  ezetimibe (ZETIA) 10 MG tablet, Take 1 tablet (10 mg total) by mouth daily., Disp: 90 tablet, Rfl: 3 .  metoprolol succinate (TOPROL-XL) 25 MG 24 hr tablet, TAKE 1 TABLET BY MOUTH ONCE DAILY*TAKE WITH OR IMMEDIATELY FOLLOWING A MEAL*, Disp: 90 tablet, Rfl: 0 .  nitroGLYCERIN (NITROSTAT) 0.4 MG SL tablet, DISSOLVE ONE TABLET UNDER THE TONGUE EVERY FIVE MINUTES AS NEEDED FOR CHEST PAIN, Disp: 25 tablet, Rfl: 0 .  pantoprazole (PROTONIX) 40 MG tablet, Take 1 tablet (40 mg total) by mouth daily., Disp: 30 tablet, Rfl: 5  Past Medical History: Past Medical History:  Diagnosis Date  . COPD (chronic obstructive pulmonary disease) ([place])   . Coronary artery disease 08/2017   a. 08/2017 Anterolateral STEMI/PCI:  LAD mod prox dzs, D1 99 (PCI/DES), RCA mod dist dzs, EF nl; b. 02/2020 PCI: LM nl, LAD 70 (iFR abnl - 0.86 ->2.5x22 Resolute Onyx DES), D1 10ost in-stent, LCX mild dzs, OM1/3 min irres, RCA 50m EF 55-65%.  . Diastolic dysfunction    a. 09/2017 Echo: EF 55-60%, severe mid antlat HK. Gr1 DD. Nl RV fxn. PASP 235mg.  . Emphysema lung ([facility]  . Hyperlipidemia   . Medical history non-contributory   . PAD (peripheral artery disease) ([facility]   a. Status post aortobifemoral bypass in 2015; b. 07/2019 ABI: R 1.02, L 1.12.  . Tobacco use     Tobacco Use: Social History   Tobacco Use  Smoking Status Current Every Day Smoker  . Packs/day: 1.00  . Years: 45.00  . Pack years: 45.00  . Types: Cigarettes  Smokeless Tobacco Never Used  Tobacco Comment   6/29 has started Wellbutrin, but forgets to take at night sometimes, no longer has the desire to smoke but  still has the habit (down to 5-6 cigarettes a day)    Labs: Recent Review Flowsheet Data    Labs for ITP Cardiac and Pulmonary Rehab Latest Ref Rng & Units 09/13/2017 09/14/2017 12/20/2017 06/23/2019 03/04/2020   Cholestrol 100 - 199 mg/dL 192 171 170 170 134   LDLCALC 0 - 99 mg/dL 100(H) 85 94 80 54   LDLDIRECT 0 - 99 mg/dL - - - 87.4 55  HDL >39 mg/dL 78 68 51 67 65   Trlycerides 0 - 149 mg/dL 68 91 123 113 75   Hemoglobin A1c 4.8 - 5.6 % 5.5 - - - -       Exercise Target Goals: Exercise Program Goal: Individual exercise prescription set using results from initial 6 min walk test and THRR while considering  patient's activity barriers and safety.   Exercise Prescription Goal: Initial exercise prescription builds to 30-45 minutes a day of aerobic activity, 2-3 days per week.  Home exercise guidelines will be given to patient during program as part of exercise prescription that the participant will acknowledge.   Education: Aerobic Exercise & Resistance Training: - Gives group verbal and written instruction on the various components of  exercise. Focuses on aerobic and resistive training programs and the benefits of this training and how to safely progress through these programs..   Education: Exercise & Equipment Safety: - Individual verbal instruction and demonstration of equipment use and safety with use of the equipment.   Cardiac Rehab from 05/11/2020 in [facility] Cardiac and Pulmonary Rehab  Date 04/13/20  Educator [facility]  Instruction Review Code 1- Verbalizes Understanding      Education: Exercise Physiology & General Exercise Guidelines: - Group verbal and written instruction with models to review the exercise physiology of the cardiovascular system and associated critical values. Provides general exercise guidelines with specific guidelines to those with heart or lung disease.    Cardiac Rehab from 05/11/2020 in [facility] Cardiac and Pulmonary Rehab  Date 04/13/20  Instruction Review Code 3- Needs Reinforcement  [need identified]      Education: Flexibility, Balance, Mind/Body Relaxation: Provides group verbal/written instruction on the benefits of flexibility and balance training, including mind/body exercise modes such as yoga, pilates and tai chi.  Demonstration and skill practice provided.   Activity Barriers & Risk Stratification:  Activity Barriers & Cardiac Risk Stratification - 04/13/20 1209      Activity Barriers & Cardiac Risk Stratification   Activity Barriers Deconditioning;Muscular Weakness;Shortness of Breath;Balance Concerns;Joint Problems;Other (comment)    Comments blood clot behind right knee, numbness in feet    Cardiac Risk Stratification High           6 Minute Walk:  6 Minute Walk    Row Name 04/13/20 1201         6 Minute Walk   Phase Initial     Distance 740 feet     Walk Time 6 minutes     # of Rest Breaks 0     MPH 1.4     METS 2.8     RPE 11     Perceived Dyspnea  3     VO2 Peak 9.79     Symptoms Yes (comment)     Comments mask difficult to breathe in, SOB, leg/claudication  pain 8/10     Resting HR 78 bpm     Resting BP 116/64     Resting Oxygen Saturation  97 %     Exercise Oxygen Saturation  during 6 min walk 96 %     Max Ex. HR 94 bpm     Max Ex. BP 142/74     2 Minute Post BP 138/80            Oxygen Initial Assessment:   Oxygen Re-Evaluation:  Oxygen Re-Evaluation    Row Name 05/09/20 1004             Program Oxygen Prescription   Program Oxygen  Prescription None         Home Oxygen   Home Oxygen Device None       Sleep Oxygen Prescription None       Home Exercise Oxygen Prescription None       Home at Rest Exercise Oxygen Prescription None       Compliance with Home Oxygen Use Yes         Goals/Expected Outcomes   Short Term Goals To learn and understand importance of monitoring SPO2 with pulse oximeter and demonstrate accurate use of the pulse oximeter.;To learn and understand importance of maintaining oxygen saturations>88%;To learn and demonstrate proper pursed lip breathing techniques or other breathing techniques.;To learn and demonstrate proper use of respiratory medications       Long  Term Goals Maintenance of O2 saturations>88%;Compliance with respiratory medication;Demonstrates proper use of MDI's;Exhibits proper breathing techniques, such as pursed lip breathing or other method taught during program session;Verbalizes importance of monitoring SPO2 with pulse oximeter and return demonstration       Comments Spoke to patient about COPD Action Plan. Reviewed and talked with the patient about the different levels of COPD severity that they should review daily and the steps they can take to manage their COPD. Went over pertinent actions for the patient can take when they feel like they are having a COPD exacerbation and the necessary treatment if needed. If patient has any changes with their breathing or feel like they are in a different zone of severity to inform staff for re-evaluation. Patient verbalizes understanding. Copy given to  patient.       Goals/Expected Outcomes Short: Follow COPD action plan. Long: Report any changes and severity of COPD.              Oxygen Discharge (Final Oxygen Re-Evaluation):  Oxygen Re-Evaluation - 05/09/20 1004      Program Oxygen Prescription   Program Oxygen Prescription None      Home Oxygen   Home Oxygen Device None    Sleep Oxygen Prescription None    Home Exercise Oxygen Prescription None    Home at Rest Exercise Oxygen Prescription None    Compliance with Home Oxygen Use Yes      Goals/Expected Outcomes   Short Term Goals To learn and understand importance of monitoring SPO2 with pulse oximeter and demonstrate accurate use of the pulse oximeter.;To learn and understand importance of maintaining oxygen saturations>88%;To learn and demonstrate proper pursed lip breathing techniques or other breathing techniques.;To learn and demonstrate proper use of respiratory medications    Long  Term Goals Maintenance of O2 saturations>88%;Compliance with respiratory medication;Demonstrates proper use of MDI's;Exhibits proper breathing techniques, such as pursed lip breathing or other method taught during program session;Verbalizes importance of monitoring SPO2 with pulse oximeter and return demonstration    Comments Spoke to patient about COPD Action Plan. Reviewed and talked with the patient about the different levels of COPD severity that they should review daily and the steps they can take to manage their COPD. Went over pertinent actions for the patient can take when they feel like they are having a COPD exacerbation and the necessary treatment if needed. If patient has any changes with their breathing or feel like they are in a different zone of severity to inform staff for re-evaluation. Patient verbalizes understanding. Copy given to patient.    Goals/Expected Outcomes Short: Follow COPD action plan. Long: Report any changes and severity of COPD.  Initial Exercise  Prescription:  Initial Exercise Prescription - 04/13/20 1200      Date of Initial Exercise RX and Referring Provider   Date 04/13/20    Referring Provider Kathlyn Sacramento MD      Recumbant Bike   Level 1    RPM 50    Watts 18    Minutes 15    METs 2.8      NuStep   Level 1    SPM 80    Minutes 15    METs 2.8      Arm Ergometer   Level 1    Watts 34    RPM 25    Minutes 15    METs 2.8      Prescription Details   Frequency (times per week) 3    Duration Progress to 30 minutes of continuous aerobic without signs/symptoms of physical distress      Intensity   THRR 40-80% of Max Heartrate 112-146    Ratings of Perceived Exertion 11-13    Perceived Dyspnea 0-4      Progression   Progression Continue to progress workloads to maintain intensity without signs/symptoms of physical distress.      Resistance Training   Training Prescription Yes    Weight 3 lb    Reps 10-15           Perform Capillary Blood Glucose checks as needed.  Exercise Prescription Changes:  Exercise Prescription Changes    Row Name 04/13/20 1200 05/03/20 1300 05/09/20 1000 05/18/20 1700       Response to Exercise   Blood Pressure (Admit) 116/64 122/66 -- 144/90    Blood Pressure (Exercise) 142/74 160/74 -- 132/64    Blood Pressure (Exit) 138/80 132/80 -- 118/76    Heart Rate (Admit) 78 bpm 62 bpm -- 86 bpm    Heart Rate (Exercise) 94 bpm 110 bpm -- 107 bpm    Heart Rate (Exit) -- 88 bpm -- 83 bpm    Oxygen Saturation (Admit) 97 % -- -- --    Oxygen Saturation (Exercise) 96 % -- -- --    Rating of Perceived Exertion (Exercise) 11 12 -- 13    Perceived Dyspnea (Exercise) 3 -- -- --    Symptoms SOB, mask harder to breathe, leg pain 8/10 none -- none    Comments walk test results -- -- --    Duration -- Continue with 30 min of aerobic exercise without signs/symptoms of physical distress. -- Continue with 30 min of aerobic exercise without signs/symptoms of physical distress.    Intensity --  THRR unchanged -- --      Progression   Progression -- Continue to progress workloads to maintain intensity without signs/symptoms of physical distress. -- Continue to progress workloads to maintain intensity without signs/symptoms of physical distress.    Average METs -- 2.7 -- 2.77      Resistance Training   Training Prescription -- Yes -- Yes    Weight -- 4 lb -- 4 lb    Reps -- 10-15 -- 10-15      Interval Training   Interval Training -- No -- No      Recumbant Bike   Level -- 1 -- 7    RPM -- 50 -- --    Watts -- -- -- 25    Minutes -- 15 -- 15    METs -- -- -- 3.3      NuStep   Level -- 2 -- 3  SPM -- 80 -- --    Minutes -- 15 -- 15    METs -- 1.8 -- 1.8      Arm Ergometer   Level -- -- -- 1    Minutes -- -- -- 15    METs -- -- -- 2.1      Home Exercise Plan   Plans to continue exercise at -- -- Home (comment) Home (comment)    Frequency -- -- Add 1 additional day to program exercise sessions. Add 1 additional day to program exercise sessions.    Initial Home Exercises Provided -- -- 05/09/20 05/09/20           Exercise Comments:   Exercise Goals and Review:  Exercise Goals    Row Name 04/13/20 1211             Exercise Goals   Increase Physical Activity Yes       Intervention Provide advice, education, support and counseling about physical activity/exercise needs.;Develop an individualized exercise prescription for aerobic and resistive training based on initial evaluation findings, risk stratification, comorbidities and participant's personal goals.       Expected Outcomes Short Term: Attend rehab on a regular basis to increase amount of physical activity.;Long Term: Add in home exercise to make exercise part of routine and to increase amount of physical activity.;Long Term: Exercising regularly at least 3-5 days a week.       Increase Strength and Stamina Yes       Intervention Provide advice, education, support and counseling about physical  activity/exercise needs.;Develop an individualized exercise prescription for aerobic and resistive training based on initial evaluation findings, risk stratification, comorbidities and participant's personal goals.       Expected Outcomes Short Term: Increase workloads from initial exercise prescription for resistance, speed, and METs.;Short Term: Perform resistance training exercises routinely during rehab and add in resistance training at home;Long Term: Improve cardiorespiratory fitness, muscular endurance and strength as measured by increased METs and functional capacity (6MWT)       Able to understand and use rate of perceived exertion (RPE) scale Yes       Intervention Provide education and explanation on how to use RPE scale       Expected Outcomes Short Term: Able to use RPE daily in rehab to express subjective intensity level;Long Term:  Able to use RPE to guide intensity level when exercising independently       Able to understand and use Dyspnea scale Yes       Intervention Provide education and explanation on how to use Dyspnea scale       Expected Outcomes Short Term: Able to use Dyspnea scale daily in rehab to express subjective sense of shortness of breath during exertion;Long Term: Able to use Dyspnea scale to guide intensity level when exercising independently       Knowledge and understanding of Target Heart Rate Range (THRR) Yes       Intervention Provide education and explanation of THRR including how the numbers were predicted and where they are located for reference       Expected Outcomes Short Term: Able to state/look up THRR;Short Term: Able to use daily as guideline for intensity in rehab;Long Term: Able to use THRR to govern intensity when exercising independently       Able to check pulse independently Yes       Intervention Provide education and demonstration on how to check pulse in carotid and radial arteries.;Review the importance of  being able to check your own pulse for  safety during independent exercise       Expected Outcomes Short Term: Able to explain why pulse checking is important during independent exercise;Long Term: Able to check pulse independently and accurately       Understanding of Exercise Prescription Yes       Intervention Provide education, explanation, and written materials on patient's individual exercise prescription       Expected Outcomes Short Term: Able to explain program exercise prescription;Long Term: Able to explain home exercise prescription to exercise independently              Exercise Goals Re-Evaluation :  Exercise Goals Re-Evaluation    Row Name 04/20/20 1017 05/03/20 1358 05/09/20 1022 05/18/20 1825       Exercise Goal Re-Evaluation   Exercise Goals Review Increase Physical Activity;Able to understand and use rate of perceived exertion (RPE) scale;Knowledge and understanding of Target Heart Rate Range (THRR);Understanding of Exercise Prescription;Increase Strength and Stamina;Able to check pulse independently Increase Physical Activity;Increase Strength and Stamina;Understanding of Exercise Prescription Increase Physical Activity;Increase Strength and Stamina;Understanding of Exercise Prescription Increase Physical Activity;Increase Strength and Stamina;Understanding of Exercise Prescription    Comments Reviewed RPE and dyspnea scales, THR and program prescription with pt today.  Pt voiced understanding and was given a copy of goals to take home. Jourdain is tolerating exercise well so far.  He does not want to try a TM.  Staff will monitor progress. Reviewed home exercise with pt today.  Pt plans to ride a bike for exercise.  Reviewed THR, pulse, RPE, sign and symptoms, pulse oximetery and when to call 911 or MD.  Also discussed weather considerations and indoor options.  Pt voiced understanding. Kerem is doing well in rehab. He has already increased his hand weights to 4 lbs and on Level 3 on the NuStep. Staff will continue to  monitor.    Expected Outcomes Short: Use RPE daily to regulate intensity. Long: Follow program prescription in THR. Short: continue to attend consistently Long: increase overall stamina Short:  add exercise outside of program sessions  Long: increase stamina and maintain exercise on his own Short: Continue attending rehab consistently Long: Increase MET level/ increase strength/ stamina           Discharge Exercise Prescription (Final Exercise Prescription Changes):  Exercise Prescription Changes - 05/18/20 1700      Response to Exercise   Blood Pressure (Admit) 144/90    Blood Pressure (Exercise) 132/64    Blood Pressure (Exit) 118/76    Heart Rate (Admit) 86 bpm    Heart Rate (Exercise) 107 bpm    Heart Rate (Exit) 83 bpm    Rating of Perceived Exertion (Exercise) 13    Symptoms none    Duration Continue with 30 min of aerobic exercise without signs/symptoms of physical distress.      Progression   Progression Continue to progress workloads to maintain intensity without signs/symptoms of physical distress.    Average METs 2.77      Resistance Training   Training Prescription Yes    Weight 4 lb    Reps 10-15      Interval Training   Interval Training No      Recumbant Bike   Level 7    Watts 25    Minutes 15    METs 3.3      NuStep   Level 3    Minutes 15    METs 1.8  Arm Ergometer   Level 1    Minutes 15    METs 2.1      Home Exercise Plan   Plans to continue exercise at Home (comment)    Frequency Add 1 additional day to program exercise sessions.    Initial Home Exercises Provided 05/09/20           Nutrition:  Target Goals: Understanding of nutrition guidelines, daily intake of sodium <1537m, cholesterol <2039m calories 30% from fat and 7% or less from saturated fats, daily to have 5 or more servings of fruits and vegetables.  Education: Controlling Sodium/Reading Food Labels -Group verbal and written material supporting the discussion of  sodium use in heart healthy nutrition. Review and explanation with models, verbal and written materials for utilization of the food label.   Education: General Nutrition Guidelines/Fats and Fiber: -Group instruction provided by verbal, written material, models and posters to present the general guidelines for heart healthy nutrition. Gives an explanation and review of dietary fats and fiber.   Biometrics:  Pre Biometrics - 04/13/20 1212      Pre Biometrics   Height 5' 6.75" (1.695 m)    Weight 157 lb 11.2 oz (71.5 kg)    BMI (Calculated) 24.9    Single Leg Stand 30 seconds            Nutrition Therapy Plan and Nutrition Goals:   Nutrition Assessments:  Nutrition Assessments - 04/13/20 1213      MEDFICTS Scores   Pre Score 33           MEDIFICTS Score Key:          ?70 Need to make dietary changes          40-70 Heart Healthy Diet         ? 40 Therapeutic Level Cholesterol Diet  Nutrition Goals Re-Evaluation:   Nutrition Goals Discharge (Final Nutrition Goals Re-Evaluation):   Psychosocial: Target Goals: Acknowledge presence or absence of significant depression and/or stress, maximize coping skills, provide positive support system. Participant is able to verbalize types and ability to use techniques and skills needed for reducing stress and depression.   Education: Depression - Provides group verbal and written instruction on the correlation between heart/lung disease and depressed mood, treatment options, and the stigmas associated with seeking treatment.   [facility]  Date 04/13/20  Instruction Review Code 3- Needs Reinforcement  [need identified]      Education: Sleep Hygiene -Provides group verbal and written instruction about how sleep can affect your health.  Define sleep hygiene, discuss sleep cycles and impact of sleep habits. Review good sleep hygiene tips.     Education: Stress and Anxiety: -  Provides group verbal and written instruction about the health risks of elevated stress and causes of high stress.  Discuss the correlation between heart/lung disease and anxiety and treatment options. Review healthy ways to manage with stress and anxiety.    Initial Review & Psychosocial Screening:  Initial Psych Review & Screening - 04/12/20 1417      Initial Review   Current issues with History of Depression;Current Stress Concerns;Current Sleep Concerns    Source of Stress Concerns Chronic Illness;Retirement/disability;Family    Comments Mother has dementia and shares caregiving responsibility with sister (1 month at a time),  Been on disability since 2018 with first MI, Rabbit is eating garden, Does not sleep well at least twice a week only a few hours  Family Dynamics   Good Support System? No   Sister helps with mother, they lean on him for support, very independent   Concerns No support system      Barriers   Psychosocial barriers to participate in program The patient should benefit from training in stress management and relaxation.;Psychosocial barriers identified (see note)      Screening Interventions   Interventions Encouraged to exercise;To provide support and resources with identified psychosocial needs;Provide feedback about the scores to participant    Expected Outcomes Short Term goal: Utilizing psychosocial counselor, staff and physician to assist with identification of specific Stressors or current issues interfering with healing process. Setting desired goal for each stressor or current issue identified.;Long Term Goal: Stressors or current issues are controlled or eliminated.;Short Term goal: Identification and review with participant of any Quality of Life or Depression concerns found by scoring the questionnaire.;Long Term goal: The participant improves quality of Life and PHQ9 Scores as seen by post scores and/or verbalization of changes           Quality of  Life Scores:   Quality of Life - 04/13/20 1212      Quality of Life   Select Quality of Life      Quality of Life Scores   Health/Function Pre 17.5 %    Socioeconomic Pre 19.75 %    Psych/Spiritual Pre 19.5 %    Family Pre 14.5 %    GLOBAL Pre 18.11 %          Scores of 19 and below usually indicate a poorer quality of life in these areas.  A difference of  2-3 points is a clinically meaningful difference.  A difference of 2-3 points in the total score of the Quality of Life Index has been associated with significant improvement in overall quality of life, self-image, physical symptoms, and general health in studies assessing change in quality of life.  PHQ-9: Recent Review Flowsheet Data    Depression screen [facility] 2/9 04/13/2020   Decreased Interest 2   Down, Depressed, Hopeless 0   PHQ - 2 Score 2   Altered sleeping 3   Tired, decreased energy 2   Change in appetite 2   Feeling bad or failure about yourself  0   Trouble concentrating 0   Moving slowly or fidgety/restless 0   Suicidal thoughts 0   PHQ-9 Score 9   Difficult doing work/chores Somewhat difficult     Interpretation of Total Score  Total Score Depression Severity:  1-4 = Minimal depression, 5-9 = Mild depression, 10-14 = Moderate depression, 15-19 = Moderately severe depression, 20-27 = Severe depression   Psychosocial Evaluation and Intervention:  Psychosocial Evaluation - 04/12/20 1428      Psychosocial Evaluation & Interventions   Interventions Stress management education;Encouraged to exercise with the program and follow exercise prescription    Comments Mr. Sartwell is coming into rehab after another stent placement.  He had his first heart event back in 2018 and has been on disability since and then retirement.  He has a history of depression following his divorce in 1993, but recently has been fine.  He is trying to quit smoking and currently on Wellbutrin for it.  He has lost his desire to smoke, but  still working on the habit part.  He is a little leary of exercise as his father died during a stress test at [facility] with nurses looking on.  He does want to get stronger and sleep  better.  He has noticed that he sleeps best on days that he is active and hopes this will help more. He is very indpendent and does not lean on anyone, but rather they all lean on him.  His mother has dementia and he shares caregiving with his sister.  Last month, she started have seziures and they still have not found the cause.   Exercise will be a good stress relief for him as well.    Expected Outcomes Short: Exercise for stress relief Long: Build confidence for exercise    Continue Psychosocial Services  Follow up required by staff           Psychosocial Re-Evaluation:  Psychosocial Re-Evaluation    Lyden Name 05/09/20 1036 05/09/20 1047           Psychosocial Re-Evaluation   Current issues with Current Sleep Concerns Current Sleep Concerns;Current Stress Concerns      Comments -- Delando says he has no new stress concerns.  He doesnt sleep well - wakes up early but isnt tired during the day      Expected Outcomes -- Short: continue to attend to help manage stress Long:  maintain positive attitude             Psychosocial Discharge (Final Psychosocial Re-Evaluation):  Psychosocial Re-Evaluation - 05/09/20 1047      Psychosocial Re-Evaluation   Current issues with Current Sleep Concerns;Current Stress Concerns    Comments Fahad says he has no new stress concerns.  He doesnt sleep well - wakes up early but isnt tired during the day    Expected Outcomes Short: continue to attend to help manage stress Long:  maintain positive attitude           Vocational Rehabilitation: Provide vocational rehab assistance to qualifying candidates.   Vocational Rehab Evaluation & Intervention:  Vocational Rehab - 04/12/20 1417      Initial Vocational Rehab Evaluation & Intervention   Assessment shows  need for Vocational Rehabilitation No           Education: Education Goals: Education classes will be provided on a variety of topics geared toward better understanding of heart health and risk factor modification. Participant will state understanding/return demonstration of topics presented as noted by education test scores.  Learning Barriers/Preferences:  Learning Barriers/Preferences - 04/12/20 1417      Learning Barriers/Preferences   Learning Barriers Sight;Exercise Concerns   glasses for reading and outside, fearful of treadmill as father died during stress test   Learning Preferences Skilled Demonstration           General Cardiac Education Topics:  AED/CPR: - Group verbal and written instruction with the use of models to demonstrate the basic use of the AED with the basic ABC's of resuscitation.   Anatomy & Physiology of the Heart: - Group verbal and written instruction and models provide basic cardiac anatomy and physiology, with the coronary electrical and arterial systems. Review of Valvular disease and Heart Failure   Cardiac Procedures: - Group verbal and written instruction to review commonly prescribed medications for heart disease. Reviews the medication, class of the drug, and side effects. Includes the steps to properly store meds and maintain the prescription regimen. (beta blockers and nitrates)   Cardiac Medications I: - Group verbal and written instruction to review commonly prescribed medications for heart disease. Reviews the medication, class of the drug, and side effects. Includes the steps to properly store meds and maintain the prescription regimen.   Cardiac  Rehab from 05/11/2020 in [facility] Cardiac and Pulmonary Rehab  Date 05/11/20  Educator SB  Instruction Review Code 1- Verbalizes Understanding      Cardiac Medications II: -Group verbal and written instruction to review commonly prescribed medications for heart disease. Reviews the medication,  class of the drug, and side effects. (all other drug classes)   Cardiac Rehab from 05/11/2020 in [facility] Cardiac and Pulmonary Rehab  Date 04/13/20  Instruction Review Code 3- Needs Reinforcement  [need identified]       Go Sex-Intimacy & Heart Disease, Get SMART - Goal Setting: - Group verbal and written instruction through game format to discuss heart disease and the return to sexual intimacy. Provides group verbal and written material to discuss and apply goal setting through the application of the S.M.A.R.T. Method.   Other Matters of the Heart: - Provides group verbal, written materials and models to describe Stable Angina and Peripheral Artery. Includes description of the disease process and treatment options available to the cardiac patient.   Infection Prevention: - Provides verbal and written material to individual with discussion of infection control including proper hand washing and proper equipment cleaning during exercise session.   Cardiac Rehab from 05/11/2020 in [facility] Cardiac and Pulmonary Rehab  Date 04/13/20  Educator [facility]  Instruction Review Code 1- Verbalizes Understanding      Falls Prevention: - Provides verbal and written material to individual with discussion of falls prevention and safety.   Cardiac Rehab from 05/11/2020 in [facility] Cardiac and Pulmonary Rehab  Date 04/13/20  Educator [facility]  Instruction Review Code 1- Verbalizes Understanding      Other: -Provides group and verbal instruction on various topics (see comments)   Knowledge Questionnaire Score:  Knowledge Questionnaire Score - 04/13/20 1213      Knowledge Questionnaire Score   Pre Score 22/26 Education Focus: Depression, heart failure, exercise           Core Components/Risk Factors/Patient Goals at Admission:  Personal Goals and Risk Factors at Admission - 04/13/20 1214      Core Components/Risk Factors/Patient Goals on Admission    Weight Management Yes;Weight Maintenance    Intervention  Weight Management: Develop a combined nutrition and exercise program designed to reach desired caloric intake, while maintaining appropriate intake of nutrient and fiber, sodium and fats, and appropriate energy expenditure required for the weight goal.;Weight Management: Provide education and appropriate resources to help participant work on and attain dietary goals.    Admit Weight 157 lb 11.2 oz (71.5 kg)    Goal Weight: Short Term 158 lb (71.7 kg)    Goal Weight: Long Term 158 lb (71.7 kg)    Expected Outcomes Short Term: Continue to assess and modify interventions until short term weight is achieved;Long Term: Adherence to nutrition and physical activity/exercise program aimed toward attainment of established weight goal;Weight Maintenance: Understanding of the daily nutrition guidelines, which includes 25-35% calories from fat, 7% or less cal from saturated fats, less than '200mg'$  cholesterol, less than 1.5gm of sodium, & 5 or more servings of fruits and vegetables daily    Tobacco Cessation Yes    Number of packs per day 0.5    Intervention Assist the participant in steps to quit. Provide individualized education and counseling about committing to Tobacco Cessation, relapse prevention, and pharmacological support that can be provided by physician.;Advice worker, assist with locating and accessing local/national Quit Smoking programs, and support quit date choice.    Expected Outcomes Short Term: Will demonstrate readiness to  quit, by selecting a quit date.;Short Term: Will quit all tobacco product use, adhering to prevention of relapse plan.;Long Term: Complete abstinence from all tobacco products for at least 12 months from quit date.    Lipids Yes    Intervention Provide education and support for participant on nutrition & aerobic/resistive exercise along with prescribed medications to achieve LDL <34m, HDL >466m    Expected Outcomes Short Term: Participant states understanding of  desired cholesterol values and is compliant with medications prescribed. Participant is following exercise prescription and nutrition guidelines.;Long Term: Cholesterol controlled with medications as prescribed, with individualized exercise RX and with personalized nutrition plan. Value goals: LDL < 7027mHDL > 40 mg.           Education:Diabetes - Individual verbal and written instruction to review signs/symptoms of diabetes, desired ranges of glucose level fasting, after meals and with exercise. Acknowledge that pre and post exercise glucose checks will be done for 3 sessions at entry of program.   Education: Know Your Numbers and Risk Factors: -Group verbal and written instruction about important numbers in your health.  Discussion of what are risk factors and how they play a role in the disease process.  Review of Cholesterol, Blood Pressure, Diabetes, and BMI and the role they play in your overall health.   [facility]  Date 04/13/20  Instruction Review Code 3- Needs Reinforcement  [need identified]      Core Components/Risk Factors/Patient Goals Review:   Goals and Risk Factor Review    Row Name 05/09/20 1017 05/09/20 1019           Core Components/Risk Factors/Patient Goals Review   Personal Goals Review Weight Management/Obesity;Tobacco Cessation;Hypertension --      Review TroHarvy taking all meds except the "quit smoking" one.  He is down to one cigarette per day with morning coffee. He says he would like to lose weight as he has gained since his heart attack.  He is trying to eat heart healthy.      Expected Outcomes -- Short:  continue to take meds as directed Long:  manage risk factors             Core Components/Risk Factors/Patient Goals at Discharge (Final Review):   Goals and Risk Factor Review - 05/09/20 1019      Core Components/Risk Factors/Patient Goals Review   Review He says he would like to lose weight  as he has gained since his heart attack.  He is trying to eat heart healthy.    Expected Outcomes Short:  continue to take meds as directed Long:  manage risk factors           ITP Comments:  ITP Comments    Row Name 04/12/20 1457 04/13/20 1201 04/13/20 1228 04/20/20 1015 04/27/20 1115   ITP Comments Completed virtual orientation today.  EP evaluation is scheduled for Wed 6/30 at 11am.  [facility] Completed 6MWT and gym orientation. Initial ITP created and sent for review to Dr. MarEmily Filbertedical Director. TroDhyan a current tobacco user. Intervention for tobacco cessation was provided at the initial medical review. He was asked about readiness to quit and reported trying to quit current, has reduced to half a pack a day and using Wellbutrin . Patient was advised and educated about tobacco cessation using combination therapy, tobacco cessation classes, quit line, and quit smoking apps. Patient demonstrated understanding  of this material. Staff will continue to provide encouragement and follow up with the patient throughout the program. First full day of exercise!  Patient was oriented to gym and equipment including functions, settings, policies, and procedures.  Patient's individual exercise prescription and treatment plan were reviewed.  All starting workloads were established based on the results of the 6 minute walk test done at initial orientation visit.  The plan for exercise progression was also introduced and progression will be customized based on patient's performance and goals 30 Day review completed. Medical Director ITP review done, changes made as directed, and signed approval by Medical Director. New to program   Row Name 05/25/20 0757           ITP Comments 30 Day review completed. Medical Director ITP review done, changes made as directed, and signed approval by Medical Director.              Comments:

## 2020-05-25 NOTE — Progress Notes (Signed)
Daily Session Note  Patient Details  Name: [name] MRN: [id number] Date of Birth: Feb 10, 1961 Referring Provider:     Cardiac Rehab from 04/13/2020 in [facility] Cardiac and Pulmonary Rehab  Referring Provider Kathlyn Sacramento MD      Encounter Date: 05/25/2020  Check In:  Session Check In - 05/25/20 0951      Check-In   Supervising physician immediately available to respond to emergencies See telemetry face sheet for immediately available ER MD    Location [facility]-Cardiac & Pulmonary Rehab    Staff Present Renita Papa, RN BSN;Joseph Foy [place], [place], ACSM CEP, Exercise Physiologist;Jessica [place], MA, RCEP, CCRP, CCET    Virtual Visit No    Medication changes reported     No    Fall or balance concerns reported    No    Warm-up and Cool-down Performed on first and last piece of equipment    Resistance Training Performed Yes    VAD Patient? No    PAD/SET Patient? No      Pain Assessment   Currently in Pain? No/denies              Social History   Tobacco Use  Smoking Status Current Every Day Smoker  . Packs/day: 1.00  . Years: 45.00  . Pack years: 45.00  . Types: Cigarettes  Smokeless Tobacco Never Used  Tobacco Comment   6/29 has started Wellbutrin, but forgets to take at night sometimes, no longer has the desire to smoke but  still has the habit (down to 5-6 cigarettes a day)    Goals Met:  Independence with exercise equipment Exercise tolerated well No report of cardiac concerns or symptoms Strength training completed today  Goals Unmet:  Not Applicable  Comments: Pt able to follow exercise prescription today without complaint.  Will continue to monitor for progression.    Dr. Emily Filbert is Medical Director for [place] and [facility].

## 2020-05-27 ENCOUNTER — Other Ambulatory Visit: Payer: Self-pay

## 2020-05-27 ENCOUNTER — Encounter: Payer: Medicare Other | Admitting: *Deleted

## 2020-05-27 DIAGNOSIS — Z955 Presence of coronary angioplasty implant and graft: Secondary | ICD-10-CM | POA: Diagnosis not present

## 2020-05-27 NOTE — Progress Notes (Signed)
Daily Session Note  Patient Details  Name: [name] MRN: [id number] Date of Birth: 11/15/1960 Referring Provider:     Cardiac Rehab from 04/13/2020 in [facility] Cardiac and Pulmonary Rehab  Referring Provider Kathlyn Sacramento MD      Encounter Date: 05/27/2020  Check In:  Session Check In - 05/27/20 1000      Check-In   Supervising physician immediately available to respond to emergencies See telemetry face sheet for immediately available ER MD    Location [facility]-Cardiac & Pulmonary Rehab    Staff Present Renita Papa, RN BSN;Joseph [address] [place], [place], RCEP, CCRP, CCET    Virtual Visit No    Medication changes reported     No    Fall or balance concerns reported    No    Warm-up and Cool-down Performed on first and last piece of equipment    Resistance Training Performed Yes    VAD Patient? No    PAD/SET Patient? No      Pain Assessment   Currently in Pain? No/denies              Social History   Tobacco Use  Smoking Status Current Every Day Smoker  . Packs/day: 1.00  . Years: 45.00  . Pack years: 45.00  . Types: Cigarettes  Smokeless Tobacco Never Used  Tobacco Comment   6/29 has started Wellbutrin, but forgets to take at night sometimes, no longer has the desire to smoke but  still has the habit (down to 5-6 cigarettes a day)    Goals Met:  Independence with exercise equipment Exercise tolerated well No report of cardiac concerns or symptoms Strength training completed today  Goals Unmet:  Not Applicable  Comments: Pt able to follow exercise prescription today without complaint.  Will continue to monitor for progression.    Dr. Emily Filbert is Medical Director for [place] and [facility].

## 2020-05-30 ENCOUNTER — Encounter: Payer: Medicare Other | Admitting: *Deleted

## 2020-05-30 ENCOUNTER — Other Ambulatory Visit: Payer: Self-pay

## 2020-05-30 DIAGNOSIS — Z955 Presence of coronary angioplasty implant and graft: Secondary | ICD-10-CM

## 2020-05-30 NOTE — Progress Notes (Signed)
Daily Session Note  Patient Details  Name: [name] MRN: [id number] Date of Birth: [birth year] Referring Provider:     Cardiac Rehab from 04/13/2020 in [facility] Cardiac and Pulmonary Rehab  Referring Provider Kathlyn Sacramento MD      Encounter Date: 05/30/2020  Check In:  Session Check In - 05/30/20 1041      Check-In   Supervising physician immediately available to respond to emergencies See telemetry face sheet for immediately available ER MD    Location [facility]-Cardiac & Pulmonary Rehab    Staff Present Heath Lark, RN, BSN, Laveda Norman, BS, ACSM CEP, Exercise Physiologist;Joseph Tessie Fass RCP,RRT,BSRT    Virtual Visit No    Medication changes reported     No    Fall or balance concerns reported    No    Current number of cigarettes/nicotine per day     0    Warm-up and Cool-down Performed on first and last piece of equipment    Resistance Training Performed Yes    VAD Patient? No    PAD/SET Patient? No      Pain Assessment   Currently in Pain? No/denies              Social History   Tobacco Use  Smoking Status Current Every Day Smoker  . Packs/day: 1.00  . Years: 45.00  . Pack years: 45.00  . Types: Cigarettes  Smokeless Tobacco Never Used  Tobacco Comment   6/29 has started Wellbutrin, but forgets to take at night sometimes, no longer has the desire to smoke but  still has the habit (down to 5-6 cigarettes a day)    Goals Met:  Independence with exercise equipment Exercise tolerated well No report of cardiac concerns or symptoms  Goals Unmet:  Not Applicable  Comments: Pt able to follow exercise prescription today without complaint.  Will continue to monitor for progression.    Dr. Emily Filbert is Medical Director for [place] and [facility].

## 2020-05-31 ENCOUNTER — Other Ambulatory Visit: Payer: Self-pay

## 2020-05-31 DIAGNOSIS — Z955 Presence of coronary angioplasty implant and graft: Secondary | ICD-10-CM

## 2020-05-31 NOTE — Progress Notes (Signed)
Completed initial RD evaluation 

## 2020-06-01 ENCOUNTER — Other Ambulatory Visit: Payer: Self-pay

## 2020-06-01 ENCOUNTER — Encounter: Payer: Self-pay | Admitting: *Deleted

## 2020-06-01 ENCOUNTER — Encounter: Payer: Medicare Other | Admitting: *Deleted

## 2020-06-01 DIAGNOSIS — Z955 Presence of coronary angioplasty implant and graft: Secondary | ICD-10-CM

## 2020-06-01 NOTE — Progress Notes (Signed)
Daily Session Note  Patient Details  Name: [name] MRN: [id number] Date of Birth: 06-27-1961 Referring Provider:     Cardiac Rehab from 04/13/2020 in [facility] Cardiac and Pulmonary Rehab  Referring Provider Kathlyn Sacramento MD      Encounter Date: 06/01/2020  Check In:  Session Check In - 06/01/20 1042      Check-In   Supervising physician immediately available to respond to emergencies See telemetry face sheet for immediately available ER MD    Location [facility]-Cardiac & Pulmonary Rehab    Staff Present Heath Lark, RN, BSN, CCRP;Jessica [place], MA, RCEP, CCRP, CCET;Joseph [employer], [place], ACSM CEP, Exercise Physiologist    Virtual Visit No    Medication changes reported     No    Fall or balance concerns reported    No    Current number of cigarettes/nicotine per day     1    Warm-up and Cool-down Performed on first and last piece of equipment    Resistance Training Performed Yes    VAD Patient? No    PAD/SET Patient? No      Pain Assessment   Currently in Pain? No/denies              Social History   Tobacco Use  Smoking Status Current Every Day Smoker  . Packs/day: 1.00  . Years: 45.00  . Pack years: 45.00  . Types: Cigarettes  Smokeless Tobacco Never Used  Tobacco Comment   6/29 has started Wellbutrin, but forgets to take at night sometimes, no longer has the desire to smoke but  still has the habit (down to 5-6 cigarettes a day)  Still one cigarette a day with morning coffee  Goals Met:  Independence with exercise equipment Exercise tolerated well No report of cardiac concerns or symptoms  Goals Unmet:  Not Applicable  Comments: Pt able to follow exercise prescription today without complaint.  Will continue to monitor for progression.    Dr. Emily Filbert is Medical Director for [place] and [facility].

## 2020-06-06 ENCOUNTER — Other Ambulatory Visit: Payer: Self-pay

## 2020-06-06 ENCOUNTER — Encounter: Payer: Medicare Other | Admitting: *Deleted

## 2020-06-06 DIAGNOSIS — Z955 Presence of coronary angioplasty implant and graft: Secondary | ICD-10-CM | POA: Diagnosis not present

## 2020-06-06 NOTE — Progress Notes (Signed)
Daily Session Note  Patient Details  Name: Terance Pomplun MRN: [id number] Date of Birth: 07-07-1961 Referring Provider:     Cardiac Rehab from 04/13/2020 in [facility] Cardiac and Pulmonary Rehab  Referring Provider Kathlyn Sacramento MD      Encounter Date: 06/06/2020  Check In:  Session Check In - 06/06/20 1013      Check-In   Supervising physician immediately available to respond to emergencies See telemetry face sheet for immediately available ER MD    Location [facility]-Cardiac & Pulmonary Rehab    Staff Present Heath Lark, RN, BSN, Laveda Norman, BS, ACSM CEP, Exercise Physiologist;Jessica [place], MA, RCEP, CCRP, CCET    Virtual Visit No    Medication changes reported     No    Fall or balance concerns reported    No    Warm-up and Cool-down Performed on first and last piece of equipment    Resistance Training Performed Yes    VAD Patient? No    PAD/SET Patient? No      Pain Assessment   Currently in Pain? No/denies              Social History   Tobacco Use  Smoking Status Current Every Day Smoker  . Packs/day: 1.00  . Years: 45.00  . Pack years: 45.00  . Types: Cigarettes  Smokeless Tobacco Never Used  Tobacco Comment   8/18 still one cigarette a day with morning coffee    Goals Met:  Independence with exercise equipment Exercise tolerated well No report of cardiac concerns or symptoms  Goals Unmet:  Not Applicable  Comments: Pt able to follow exercise prescription today without complaint.  Will continue to monitor for progression.    Dr. Emily Filbert is Medical Director for [place] and [facility].

## 2020-06-08 ENCOUNTER — Encounter: Payer: Medicare Other | Admitting: *Deleted

## 2020-06-08 ENCOUNTER — Other Ambulatory Visit: Payer: Self-pay

## 2020-06-08 DIAGNOSIS — Z955 Presence of coronary angioplasty implant and graft: Secondary | ICD-10-CM | POA: Diagnosis not present

## 2020-06-08 NOTE — Progress Notes (Signed)
Daily Session Note  Patient Details  Name: [name] MRN: [id number] Date of Birth: 1960/12/02 Referring Provider:     Cardiac Rehab from 04/13/2020 in [facility] Cardiac and Pulmonary Rehab  Referring Provider Kathlyn Sacramento MD      Encounter Date: 06/08/2020  Check In:  Session Check In - 06/08/20 0947      Check-In   Supervising physician immediately available to respond to emergencies See telemetry face sheet for immediately available ER MD    Location [facility]-Cardiac & Pulmonary Rehab    Staff Present Heath Lark, RN, BSN, CCRP;Meosha Castanon [place], MA, RCEP, CCRP, [place], [place], ACSM CEP, Exercise Physiologist;Kara Eliezer [place], MS Exercise Physiologist    Virtual Visit No    Medication changes reported     No    Fall or balance concerns reported    No    Warm-up and Cool-down Performed on first and last piece of equipment    Resistance Training Performed Yes    VAD Patient? No    PAD/SET Patient? No      Pain Assessment   Currently in Pain? No/denies              Social History   Tobacco Use  Smoking Status Current Every Day Smoker  . Packs/day: 1.00  . Years: 45.00  . Pack years: 45.00  . Types: Cigarettes  Smokeless Tobacco Never Used  Tobacco Comment   8/18 still one cigarette a day with morning coffee    Goals Met:  Independence with exercise equipment Exercise tolerated well No report of cardiac concerns or symptoms Strength training completed today  Goals Unmet:  Not Applicable  Comments: Pt able to follow exercise prescription today without complaint.  Will continue to monitor for progression.    Dr. Emily Filbert is Medical Director for [place] and [facility].

## 2020-06-10 ENCOUNTER — Other Ambulatory Visit: Payer: Self-pay

## 2020-06-10 DIAGNOSIS — Z955 Presence of coronary angioplasty implant and graft: Secondary | ICD-10-CM

## 2020-06-10 NOTE — Progress Notes (Signed)
Daily Session Note  Patient Details  Name: [name] MRN: [id number] Date of Birth: 1961/02/07 Referring Provider:     Cardiac Rehab from 04/13/2020 in [facility] Cardiac and Pulmonary Rehab  Referring Provider Kathlyn Sacramento MD      Encounter Date: 06/10/2020  Check In:  Session Check In - 06/10/20 1009      Check-In   Supervising physician immediately available to respond to emergencies See telemetry face sheet for immediately available ER MD    Location [facility]-Cardiac & Pulmonary Rehab    Staff Present Carson Myrtle, BS, RRT, CPFT;Vida Rigger RN, BSN;Jessica Luan Pulling, MA, RCEP, CCRP, CCET    Virtual Visit No    Medication changes reported     No    Fall or balance concerns reported    No    Warm-up and Cool-down Performed on first and last piece of equipment    Resistance Training Performed Yes    VAD Patient? No    PAD/SET Patient? No      Pain Assessment   Currently in Pain? No/denies              Social History   Tobacco Use  Smoking Status Current Every Day Smoker  . Packs/day: 1.00  . Years: 45.00  . Pack years: 45.00  . Types: Cigarettes  Smokeless Tobacco Never Used  Tobacco Comment   8/18 still one cigarette a day with morning coffee    Goals Met:  Proper associated with RPD/PD & O2 Sat Independence with exercise equipment Exercise tolerated well No report of cardiac concerns or symptoms Strength training completed today  Goals Unmet:  Not Applicable  Comments: Pt able to follow exercise prescription today without complaint.  Will continue to monitor for progression.   Dr. Emily Filbert is Medical Director for [place] and [facility].

## 2020-06-13 ENCOUNTER — Encounter: Payer: Medicare Other | Admitting: *Deleted

## 2020-06-13 ENCOUNTER — Other Ambulatory Visit: Payer: Self-pay

## 2020-06-13 DIAGNOSIS — Z955 Presence of coronary angioplasty implant and graft: Secondary | ICD-10-CM

## 2020-06-13 NOTE — Progress Notes (Signed)
Daily Session Note  Patient Details  Name: [name] MRN: [id number] Date of Birth: 1960/11/21 Referring Provider:     Cardiac Rehab from 04/13/2020 in [facility] Cardiac and Pulmonary Rehab  Referring Provider Kathlyn Sacramento MD      Encounter Date: 06/13/2020  Check In:      Social History   Tobacco Use  Smoking Status Current Every Day Smoker  . Packs/day: 1.00  . Years: 45.00  . Pack years: 45.00  . Types: Cigarettes  Smokeless Tobacco Never Used  Tobacco Comment   8/30     o cigarettes smoked    Goals Met:  Independence with exercise equipment Exercise tolerated well No report of cardiac concerns or symptoms  Goals Unmet:  Not Applicable  Comments: Pt able to follow exercise prescription today without complaint.  Will continue to monitor for progression.    Dr. Emily Filbert is Medical Director for [place] and [facility].

## 2020-06-14 ENCOUNTER — Other Ambulatory Visit: Payer: Self-pay | Admitting: Physician Assistant

## 2020-06-15 ENCOUNTER — Encounter: Payer: Medicare Other | Attending: Cardiovascular Disease | Admitting: *Deleted

## 2020-06-15 ENCOUNTER — Other Ambulatory Visit: Payer: Self-pay

## 2020-06-15 DIAGNOSIS — E785 Hyperlipidemia, unspecified: Secondary | ICD-10-CM | POA: Insufficient documentation

## 2020-06-15 DIAGNOSIS — I2510 Atherosclerotic heart disease of native coronary artery without angina pectoris: Secondary | ICD-10-CM | POA: Diagnosis not present

## 2020-06-15 DIAGNOSIS — J439 Emphysema, unspecified: Secondary | ICD-10-CM | POA: Diagnosis not present

## 2020-06-15 DIAGNOSIS — I252 Old myocardial infarction: Secondary | ICD-10-CM | POA: Insufficient documentation

## 2020-06-15 DIAGNOSIS — Z7982 Long term (current) use of aspirin: Secondary | ICD-10-CM | POA: Diagnosis not present

## 2020-06-15 DIAGNOSIS — Z955 Presence of coronary angioplasty implant and graft: Secondary | ICD-10-CM | POA: Diagnosis present

## 2020-06-15 DIAGNOSIS — Z7902 Long term (current) use of antithrombotics/antiplatelets: Secondary | ICD-10-CM | POA: Insufficient documentation

## 2020-06-15 DIAGNOSIS — F17210 Nicotine dependence, cigarettes, uncomplicated: Secondary | ICD-10-CM | POA: Insufficient documentation

## 2020-06-15 DIAGNOSIS — I739 Peripheral vascular disease, unspecified: Secondary | ICD-10-CM | POA: Insufficient documentation

## 2020-06-15 DIAGNOSIS — Z79899 Other long term (current) drug therapy: Secondary | ICD-10-CM | POA: Diagnosis not present

## 2020-06-15 NOTE — Progress Notes (Signed)
Daily Session Note  Patient Details  Name: [name] MRN: [id number] Date of Birth: Sep 28, 1961 Referring Provider:     Cardiac Rehab from 04/13/2020 in [facility] Cardiac [facility] Pulmonary Rehab  Referring Provider Kathlyn Sacramento MD      Encounter Date: 06/15/2020  Check In:  Session Check In - 06/15/20 1038      Check-In   Supervising physician immediately available to respond to emergencies See telemetry face sheet for immediately available ER MD    Location [facility]-Cardiac & Pulmonary Rehab    Staff Present Heath Lark, RN, BSN, CCRP;Jessica [place], MA, RCEP, CCRP, CCET;Joseph [employer], [place], ACSM CEP, Exercise Physiologist    Virtual Visit No    Medication changes reported     No    Fall or balance concerns reported    No    Warm-up and Cool-down Performed on first and last piece of equipment    Resistance Training Performed Yes    VAD Patient? No    PAD/SET Patient? No      Pain Assessment   Currently in Pain? No/denies              Social History   Tobacco Use  Smoking Status Current Every Day Smoker  . Packs/day: 1.00  . Years: 45.00  . Pack years: 45.00  . Types: Cigarettes  Smokeless Tobacco Never Used  Tobacco Comment   8/18 still one cigarette a day with morning coffee    Goals Met:  Independence with exercise equipment Exercise tolerated well No report of cardiac concerns or symptoms  Goals Unmet:  Not Applicable  Comments: Pt able to follow exercise prescription today without complaint.  Will continue to monitor for progression.    Dr. Emily Filbert is Medical Director for [place] and [facility].

## 2020-06-17 ENCOUNTER — Other Ambulatory Visit: Payer: Self-pay

## 2020-06-17 DIAGNOSIS — Z955 Presence of coronary angioplasty implant and graft: Secondary | ICD-10-CM

## 2020-06-17 NOTE — Progress Notes (Signed)
Daily Session Note  Patient Details  Name: [name] MRN: [id number] Date of Birth: 07-26-1961 Referring Provider:     Cardiac Rehab from 04/13/2020 in [facility] Cardiac and Pulmonary Rehab  Referring Provider Kathlyn Sacramento MD      Encounter Date: 06/17/2020  Check In:  Session Check In - 06/17/20 1016      Check-In   Supervising physician immediately available to respond to emergencies See telemetry face sheet for immediately available ER MD    Location [facility]-Cardiac & Pulmonary Rehab    Staff Present Renita Papa, RN BSN;Wess Baney RN, BSN;Jessica [place], MA, RCEP, CCRP, CCET;Melissa [place] RDN, LDN    Virtual Visit No    Medication changes reported     No    Fall or balance concerns reported    No    Warm-up and Cool-down Performed on first and last piece of equipment    Resistance Training Performed Yes    VAD Patient? No    PAD/SET Patient? No      Pain Assessment   Currently in Pain? No/denies              Social History   Tobacco Use  Smoking Status Current Every Day Smoker  . Packs/day: 1.00  . Years: 45.00  . Pack years: 45.00  . Types: Cigarettes  Smokeless Tobacco Never Used  Tobacco Comment   8/18 still one cigarette a day with morning coffee    Goals Met:  Proper associated with RPD/PD & O2 Sat Independence with exercise equipment Exercise tolerated well No report of cardiac concerns or symptoms Strength training completed today  Goals Unmet:  Not Applicable  Comments: Pt able to follow exercise prescription today without complaint.  Will continue to monitor for progression.   Dr. Emily Filbert is Medical Director for [place] and [facility].

## 2020-06-22 ENCOUNTER — Encounter: Payer: Self-pay | Admitting: *Deleted

## 2020-06-22 DIAGNOSIS — Z955 Presence of coronary angioplasty implant and graft: Secondary | ICD-10-CM

## 2020-06-22 NOTE — Progress Notes (Signed)
Cardiac Individual Treatment Plan  Patient Details  Name: [name] MRN: [id number] Date of Birth: 09/13/1961 Referring Provider:     Cardiac Rehab from 04/13/2020 in [facility] Cardiac and Pulmonary Rehab  Referring Provider Kathlyn Sacramento MD      Initial Encounter Date:    Cardiac Rehab from 04/13/2020 in [facility] Cardiac and Pulmonary Rehab  Date 04/13/20      Visit Diagnosis: Status post coronary artery stent placement  Patient's Home Medications on Admission:  Current Outpatient Medications:  .  albuterol (PROVENTIL HFA;VENTOLIN HFA) 108 (90 Base) MCG/ACT inhaler, Inhale 2 puffs into the lungs every 6 (six) hours as needed for wheezing or shortness of breath., Disp: 1 Inhaler, Rfl: 2 .  aspirin 81 MG chewable tablet, Chew 1 tablet (81 mg total) by mouth daily., Disp: 30 tablet, Rfl: 0 .  atorvastatin (LIPITOR) 80 MG tablet, TAKE 1 TABLET BY MOUTH ONCE DAILY AT  6PM, Disp: 90 tablet, Rfl: 0 .  buPROPion (WELLBUTRIN SR) 150 MG 12 hr tablet, Take 1 tablet (150 mg total) once daily for 3 days, then increase to 1 tablet (150 mg total) twice daily thereafter., Disp: 60 tablet, Rfl: 2 .  clopidogrel (PLAVIX) 75 MG tablet, Take 1 tablet by mouth once daily, Disp: 90 tablet, Rfl: 3 .  ezetimibe (ZETIA) 10 MG tablet, Take 1 tablet by mouth once daily, Disp: 90 tablet, Rfl: 0 .  metoprolol succinate (TOPROL-XL) 25 MG 24 hr tablet, TAKE 1 TABLET BY MOUTH ONCE DAILY*TAKE WITH OR IMMEDIATELY FOLLOWING A MEAL*, Disp: 90 tablet, Rfl: 0 .  nitroGLYCERIN (NITROSTAT) 0.4 MG SL tablet, DISSOLVE ONE TABLET UNDER THE TONGUE EVERY FIVE MINUTES AS NEEDED FOR CHEST PAIN, Disp: 25 tablet, Rfl: 0 .  pantoprazole (PROTONIX) 40 MG tablet, Take 1 tablet (40 mg total) by mouth daily., Disp: 30 tablet, Rfl: 5  Past Medical History: Past Medical History:  Diagnosis Date  . COPD (chronic obstructive pulmonary disease) ([place])   . Coronary artery disease 08/2017   a. 08/2017 Anterolateral STEMI/PCI: LAD mod  prox dzs, D1 99 (PCI/DES), RCA mod dist dzs, EF nl; b. 02/2020 PCI: LM nl, LAD 70 (iFR abnl - 0.86 ->2.5x22 Resolute Onyx DES), D1 10ost in-stent, LCX mild dzs, OM1/3 min irres, RCA 5m EF 55-65%.  . Diastolic dysfunction    a. 09/2017 Echo: EF 55-60%, severe mid antlat HK. Gr1 DD. Nl RV fxn. PASP 240mg.  . [place]  . Hyperlipidemia   . Medical history non-contributory   . [place]   a. Status post aortobifemoral bypass in 2015; b. 07/2019 ABI: R 1.02, L 1.12.  . Tobacco use     Tobacco Use: Social History   Tobacco Use  Smoking Status Current Every Day Smoker  . Packs/day: 1.00  . Years: 45.00  . Pack years: 45.00  . Types: Cigarettes  Smokeless Tobacco Never Used  Tobacco Comment   8/18 still one cigarette a day with morning coffee    Labs: Recent Review Flowsheet Data    Labs for ITP Cardiac and Pulmonary Rehab Latest Ref Rng & Units 09/13/2017 09/14/2017 12/20/2017 06/23/2019 03/04/2020   Cholestrol 100 - 199 mg/dL 192 171 170 170 134   LDLCALC 0 - 99 mg/dL 100(H) 85 94 80 54   LDLDIRECT 0 - 99 mg/dL - - - 87.4 55   HDL >39 mg/dL 78 68 51 67 65   Trlycerides 0 - 149 mg/dL 68 91 123 113 75   Hemoglobin  A1c 4.8 - 5.6 % 5.5 - - - -       Exercise Target Goals: Exercise Program Goal: Individual exercise prescription set using results from initial 6 min walk test and THRR while considering  patient's activity barriers and safety.   Exercise Prescription Goal: Initial exercise prescription builds to 30-45 minutes a day of aerobic activity, 2-3 days per week.  Home exercise guidelines will be given to patient during program as part of exercise prescription that the participant will acknowledge.   Education: Aerobic Exercise & Resistance Training: - Gives group verbal and written instruction on the various components of exercise. Focuses on aerobic and resistive training programs and the benefits of this training and how to safely  progress through these programs..   Cardiac Rehab from 06/15/2020 in [facility] Cardiac and Pulmonary Rehab  Date 06/08/20  Educator [facility]  Instruction Review Code 1- Verbalizes Understanding      Education: Exercise & Equipment Safety: - Individual verbal instruction and demonstration of equipment use and safety with use of the equipment.   Cardiac Rehab from 06/15/2020 in [facility] Cardiac and Pulmonary Rehab  Date 04/13/20  Educator [facility]  Instruction Review Code 1- Verbalizes Understanding      Education: Exercise Physiology & General Exercise Guidelines: - Group verbal and written instruction with models to review the exercise physiology of the cardiovascular system and associated critical values. Provides general exercise guidelines with specific guidelines to those with heart or lung disease.    Cardiac Rehab from 06/15/2020 in [facility] Cardiac and Pulmonary Rehab  Date 04/13/20  Instruction Review Code 3- Needs Reinforcement  [need identified]      Education: Flexibility, Balance, Mind/Body Relaxation: Provides group verbal/written instruction on the benefits of flexibility and balance training, including mind/body exercise modes such as yoga, pilates and tai chi.  Demonstration and skill practice provided.   Cardiac Rehab from 06/15/2020 in [facility] Cardiac and Pulmonary Rehab  Date 06/15/20  Educator as  Instruction Review Code 1- Verbalizes Understanding      Activity Barriers & Risk Stratification:  Activity Barriers & Cardiac Risk Stratification - 04/13/20 1209      Activity Barriers & Cardiac Risk Stratification   Activity Barriers Deconditioning;Muscular Weakness;Shortness of Breath;Balance Concerns;Joint Problems;Other (comment)    Comments blood clot behind right knee, numbness in feet    Cardiac Risk Stratification High           6 Minute Walk:  6 Minute Walk    Row Name 04/13/20 1201         6 Minute Walk   Phase Initial     Distance 740 feet     Walk Time 6 minutes     # of  Rest Breaks 0     MPH 1.4     METS 2.8     RPE 11     Perceived Dyspnea  3     VO2 Peak 9.79     Symptoms Yes (comment)     Comments mask difficult to breathe in, SOB, leg/claudication pain 8/10     Resting HR 78 bpm     Resting BP 116/64     Resting Oxygen Saturation  97 %     Exercise Oxygen Saturation  during 6 min walk 96 %     Max Ex. HR 94 bpm     Max Ex. BP 142/74     2 Minute Post BP 138/80            Oxygen Initial Assessment:  Oxygen Re-Evaluation:  Oxygen Re-Evaluation    Row Name 05/09/20 1004             Program Oxygen Prescription   Program Oxygen Prescription None         Home Oxygen   Home Oxygen Device None       Sleep Oxygen Prescription None       Home Exercise Oxygen Prescription None       Home at Rest Exercise Oxygen Prescription None       Compliance with Home Oxygen Use Yes         Goals/Expected Outcomes   Short Term Goals To learn and understand importance of monitoring SPO2 with pulse oximeter and demonstrate accurate use of the pulse oximeter.;To learn and understand importance of maintaining oxygen saturations>88%;To learn and demonstrate proper pursed lip breathing techniques or other breathing techniques.;To learn and demonstrate proper use of respiratory medications       Long  Term Goals Maintenance of O2 saturations>88%;Compliance with respiratory medication;Demonstrates proper use of MDI's;Exhibits proper breathing techniques, such as pursed lip breathing or other method taught during program session;Verbalizes importance of monitoring SPO2 with pulse oximeter and return demonstration       Comments Spoke to patient about COPD Action Plan. Reviewed and talked with the patient about the different levels of COPD severity that they should review daily and the steps they can take to manage their COPD. Went over pertinent actions for the patient can take when they feel like they are having a COPD exacerbation and the necessary treatment if  needed. If patient has any changes with their breathing or feel like they are in a different zone of severity to inform staff for re-evaluation. Patient verbalizes understanding. Copy given to patient.       Goals/Expected Outcomes Short: Follow COPD action plan. Long: Report any changes and severity of COPD.              Oxygen Discharge (Final Oxygen Re-Evaluation):  Oxygen Re-Evaluation - 05/09/20 1004      Program Oxygen Prescription   Program Oxygen Prescription None      Home Oxygen   Home Oxygen Device None    Sleep Oxygen Prescription None    Home Exercise Oxygen Prescription None    Home at Rest Exercise Oxygen Prescription None    Compliance with Home Oxygen Use Yes      Goals/Expected Outcomes   Short Term Goals To learn and understand importance of monitoring SPO2 with pulse oximeter and demonstrate accurate use of the pulse oximeter.;To learn and understand importance of maintaining oxygen saturations>88%;To learn and demonstrate proper pursed lip breathing techniques or other breathing techniques.;To learn and demonstrate proper use of respiratory medications    Long  Term Goals Maintenance of O2 saturations>88%;Compliance with respiratory medication;Demonstrates proper use of MDI's;Exhibits proper breathing techniques, such as pursed lip breathing or other method taught during program session;Verbalizes importance of monitoring SPO2 with pulse oximeter and return demonstration    Comments Spoke to patient about COPD Action Plan. Reviewed and talked with the patient about the different levels of COPD severity that they should review daily and the steps they can take to manage their COPD. Went over pertinent actions for the patient can take when they feel like they are having a COPD exacerbation and the necessary treatment if needed. If patient has any changes with their breathing or feel like they are in a different zone of severity to inform staff for re-evaluation. Patient  verbalizes understanding. Copy given to patient.    Goals/Expected Outcomes Short: Follow COPD action plan. Long: Report any changes and severity of COPD.           Initial Exercise Prescription:  Initial Exercise Prescription - 04/13/20 1200      Date of Initial Exercise RX and Referring Provider   Date 04/13/20    Referring Provider Kathlyn Sacramento MD      Recumbant Bike   Level 1    RPM 50    Watts 18    Minutes 15    METs 2.8      NuStep   Level 1    SPM 80    Minutes 15    METs 2.8      Arm Ergometer   Level 1    Watts 34    RPM 25    Minutes 15    METs 2.8      Prescription Details   Frequency (times per week) 3    Duration Progress to 30 minutes of continuous aerobic without signs/symptoms of physical distress      Intensity   THRR 40-80% of Max Heartrate 112-146    Ratings of Perceived Exertion 11-13    Perceived Dyspnea 0-4      Progression   Progression Continue to progress workloads to maintain intensity without signs/symptoms of physical distress.      Resistance Training   Training Prescription Yes    Weight 3 lb    Reps 10-15           Perform Capillary Blood Glucose checks as needed.  Exercise Prescription Changes:  Exercise Prescription Changes    Row Name 04/13/20 1200 05/03/20 1300 05/09/20 1000 05/18/20 1700 05/31/20 1200     Response to Exercise   Blood Pressure (Admit) 116/64 122/66 -- 144/90 142/70   Blood Pressure (Exercise) 142/74 160/74 -- 132/64 164/82   Blood Pressure (Exit) 138/80 132/80 -- 118/76 122/80   Heart Rate (Admit) 78 bpm 62 bpm -- 86 bpm 94 bpm   Heart Rate (Exercise) 94 bpm 110 bpm -- 107 bpm 113 bpm   Heart Rate (Exit) -- 88 bpm -- 83 bpm 89 bpm   Oxygen Saturation (Admit) 97 % -- -- -- --   Oxygen Saturation (Exercise) 96 % -- -- -- --   Rating of Perceived Exertion (Exercise) 11 12 -- 13 13   Perceived Dyspnea (Exercise) 3 -- -- -- --   Symptoms SOB, mask harder to breathe, leg pain 8/10 none -- none  none   Comments walk test results -- -- -- --   Duration -- Continue with 30 min of aerobic exercise without signs/symptoms of physical distress. -- Continue with 30 min of aerobic exercise without signs/symptoms of physical distress. Continue with 30 min of aerobic exercise without signs/symptoms of physical distress.   Intensity -- THRR unchanged -- -- --     Progression   Progression -- Continue to progress workloads to maintain intensity without signs/symptoms of physical distress. -- Continue to progress workloads to maintain intensity without signs/symptoms of physical distress. Continue to progress workloads to maintain intensity without signs/symptoms of physical distress.   Average METs -- 2.7 -- 2.77 2.78     Resistance Training   Training Prescription -- Yes -- Yes Yes   Weight -- 4 lb -- 4 lb 4 lb   Reps -- 10-15 -- 10-15 10-15     Interval Training   Interval Training -- No -- No No  Recumbant Bike   Level -- 1 -- 7 7   RPM -- 50 -- -- 50   Watts -- -- -- 25 25   Minutes -- 15 -- 15 15   METs -- -- -- 3.3 3.06     NuStep   Level -- 2 -- 3 --   SPM -- 80 -- -- --   Minutes -- 15 -- 15 --   METs -- 1.8 -- 1.8 --     Arm Ergometer   Level -- -- -- 1 1   Minutes -- -- -- 15 15   METs -- -- -- 2.1 2.5     Home Exercise Plan   Plans to continue exercise at -- -- Home (comment) Home (comment) Home (comment)   Frequency -- -- Add 1 additional day to program exercise sessions. Add 1 additional day to program exercise sessions. Add 1 additional day to program exercise sessions.   Initial Home Exercises Provided -- -- 05/09/20 05/09/20 05/09/20   Row Name 06/14/20 1100             Response to Exercise   Blood Pressure (Admit) 130/82       Blood Pressure (Exercise) 152/72       Blood Pressure (Exit) 108/76       Heart Rate (Admit) 98 bpm       Heart Rate (Exercise) 136 bpm       Heart Rate (Exit) 75 bpm       Rating of Perceived Exertion (Exercise) 14        Symptoms none       Duration Continue with 30 min of aerobic exercise without signs/symptoms of physical distress.       Intensity THRR unchanged         Progression   Progression Continue to progress workloads to maintain intensity without signs/symptoms of physical distress.       Average METs 3.36         Resistance Training   Training Prescription Yes       Weight 5 lb       Reps 10-15         Interval Training   Interval Training No         Recumbant Bike   Level 8       Watts 41       Minutes 15       METs 3.68         NuStep   Level 5       Minutes 15       METs 2.9         Arm Ergometer   Level 4       Minutes 15       METs 3.5         Home Exercise Plan   Plans to continue exercise at Home (comment)       Frequency Add 1 additional day to program exercise sessions.       Initial Home Exercises Provided 05/09/20              Exercise Comments:   Exercise Goals and Review:  Exercise Goals    Row Name 04/13/20 1211             Exercise Goals   Increase Physical Activity Yes       Intervention Provide advice, education, support and counseling about physical activity/exercise needs.;Develop an individualized exercise prescription for aerobic and resistive training  based on initial evaluation findings, risk stratification, comorbidities and participant's personal goals.       Expected Outcomes Short Term: Attend rehab on a regular basis to increase amount of physical activity.;Long Term: Add in home exercise to make exercise part of routine and to increase amount of physical activity.;Long Term: Exercising regularly at least 3-5 days a week.       Increase Strength and Stamina Yes       Intervention Provide advice, education, support and counseling about physical activity/exercise needs.;Develop an individualized exercise prescription for aerobic and resistive training based on initial evaluation findings, risk stratification, comorbidities and  participant's personal goals.       Expected Outcomes Short Term: Increase workloads from initial exercise prescription for resistance, speed, and METs.;Short Term: Perform resistance training exercises routinely during rehab and add in resistance training at home;Long Term: Improve cardiorespiratory fitness, muscular endurance and strength as measured by increased METs and functional capacity (6MWT)       Able to understand and use rate of perceived exertion (RPE) scale Yes       Intervention Provide education and explanation on how to use RPE scale       Expected Outcomes Short Term: Able to use RPE daily in rehab to express subjective intensity level;Long Term:  Able to use RPE to guide intensity level when exercising independently       Able to understand and use Dyspnea scale Yes       Intervention Provide education and explanation on how to use Dyspnea scale       Expected Outcomes Short Term: Able to use Dyspnea scale daily in rehab to express subjective sense of shortness of breath during exertion;Long Term: Able to use Dyspnea scale to guide intensity level when exercising independently       Knowledge and understanding of Target Heart Rate Range (THRR) Yes       Intervention Provide education and explanation of THRR including how the numbers were predicted and where they are located for reference       Expected Outcomes Short Term: Able to state/look up THRR;Short Term: Able to use daily as guideline for intensity in rehab;Long Term: Able to use THRR to govern intensity when exercising independently       Able to check pulse independently Yes       Intervention Provide education and demonstration on how to check pulse in carotid and radial arteries.;Review the importance of being able to check your own pulse for safety during independent exercise       Expected Outcomes Short Term: Able to explain why pulse checking is important during independent exercise;Long Term: Able to check pulse  independently and accurately       Understanding of Exercise Prescription Yes       Intervention Provide education, explanation, and written materials on patient's individual exercise prescription       Expected Outcomes Short Term: Able to explain program exercise prescription;Long Term: Able to explain home exercise prescription to exercise independently              Exercise Goals Re-Evaluation :  Exercise Goals Re-Evaluation    Row Name 04/20/20 1017 05/03/20 1358 05/09/20 1022 05/18/20 1825 05/31/20 1255     Exercise Goal Re-Evaluation   Exercise Goals Review Increase Physical Activity;Able to understand and use rate of perceived exertion (RPE) scale;Knowledge and understanding of Target Heart Rate Range (THRR);Understanding of Exercise Prescription;Increase Strength and Stamina;Able to check pulse independently Increase Physical Activity;Increase  Strength and Stamina;Understanding of Exercise Prescription Increase Physical Activity;Increase Strength and Stamina;Understanding of Exercise Prescription Increase Physical Activity;Increase Strength and Stamina;Understanding of Exercise Prescription Increase Physical Activity;Increase Strength and Stamina;Understanding of Exercise Prescription   Comments Reviewed RPE and dyspnea scales, THR and program prescription with pt today.  Pt voiced understanding and was given a copy of goals to take home. [name] is tolerating exercise well so far.  He does not want to try a TM.  Staff will monitor progress. Reviewed home exercise with pt today.  Pt plans to ride a bike for exercise.  Reviewed THR, pulse, RPE, sign and symptoms, pulse oximetery and when to call 911 or MD.  Also discussed weather considerations and indoor options.  Pt voiced understanding. [name] is doing well in rehab. He has already increased his hand weights to 4 lbs and on Level 3 on the NuStep. Staff will continue to monitor. [name] has progressed to level 4 on NS and level 7 on RB.  He works at  lower end of THR range, but still is only on seated machines.  Staff will monitor progress.   Expected Outcomes Short: Use RPE daily to regulate intensity. Long: Follow program prescription in THR. Short: continue to attend consistently Long: increase overall stamina Short:  add exercise outside of program sessions  Long: increase stamina and maintain exercise on his own Short: Continue attending rehab consistently Long: Increase MET level/ increase strength/ stamina Short:  continue to increase workloads as tolerated Long: improve stamina and MET level   Row Name 06/14/20 1159 06/17/20 1016           Exercise Goal Re-Evaluation   Exercise Goals Review Increase Physical Activity;Increase Strength and Stamina;Understanding of Exercise Prescription Increase Physical Activity;Increase Strength and Stamina;Understanding of Exercise Prescription      Comments [name] continues to do well in rehab.  He is now up to level 8 on the bike and level 4 on the Arm Crank.  We will continue to monitor his progress. [name] continues to do well in rehab.  He is active outside of rehab and does not do any structured exercise. Pt has many stressors right now and discussed how exercise could help - especially things such as yoga.      Expected Outcomes Short: Increase hand weights Long: Continue to improve stamina. Short: exercise outside of rehab Long: Continue to improve stamina.             Discharge Exercise Prescription (Final Exercise Prescription Changes):  Exercise Prescription Changes - 06/14/20 1100      Response to Exercise   Blood Pressure (Admit) 130/82    Blood Pressure (Exercise) 152/72    Blood Pressure (Exit) 108/76    Heart Rate (Admit) 98 bpm    Heart Rate (Exercise) 136 bpm    Heart Rate (Exit) 75 bpm    Rating of Perceived Exertion (Exercise) 14    Symptoms none    Duration Continue with 30 min of aerobic exercise without signs/symptoms of physical distress.    Intensity THRR unchanged       Progression   Progression Continue to progress workloads to maintain intensity without signs/symptoms of physical distress.    Average METs 3.36      Resistance Training   Training Prescription Yes    Weight 5 lb    Reps 10-15      Interval Training   Interval Training No      Recumbant Bike   Level 8    Watts  41    Minutes 15    METs 3.68      NuStep   Level 5    Minutes 15    METs 2.9      Arm Ergometer   Level 4    Minutes 15    METs 3.5      Home Exercise Plan   Plans to continue exercise at Home (comment)    Frequency Add 1 additional day to program exercise sessions.    Initial Home Exercises Provided 05/09/20           Nutrition:  Target Goals: Understanding of nutrition guidelines, daily intake of sodium <1550m, cholesterol <2051m calories 30% from fat and 7% or less from saturated fats, daily to have 5 or more servings of fruits and vegetables.  Education: Controlling Sodium/Reading Food Labels -Group verbal and written material supporting the discussion of sodium use in heart healthy nutrition. Review and explanation with models, verbal and written materials for utilization of the food label.   Education: General Nutrition Guidelines/Fats and Fiber: -Group instruction provided by verbal, written material, models and posters to present the general guidelines for heart healthy nutrition. Gives an explanation and review of dietary fats and fiber.   Biometrics:  Pre Biometrics - 04/13/20 1212      Pre Biometrics   Height 5' 6.75" (1.695 m)    Weight 157 lb 11.2 oz (71.5 kg)    BMI (Calculated) 24.9    Single Leg Stand 30 seconds            Nutrition Therapy Plan and Nutrition Goals:  Nutrition Therapy & Goals - 05/31/20 0832      Nutrition Therapy   Diet Heart healthy, low Na, pulmonary MNT    Drug/Food Interactions Statins/Certain Fruits    Protein (specify units) 80-85g    Fiber 30 grams    Whole Grain Foods 3 servings    Saturated  Fats 12 max. grams    Fruits and Vegetables 5 servings/day    Sodium 1.5 grams      Personal Nutrition Goals   Nutrition Goal ST: add heart healthy fats, experiment with new fruits and vegetables, LT: Weight loss (feeks like he is carrying around a watermelon) - gained 20 pounds - getting much less exercise    Comments Pt primary diagnosis for Cardiac Rehab is s/p coronary artery placement. Pt also presents with CAD, HLD, PAD, STEMI, COPD - emphysema, and GERD. Pt is a current smoker - pt reports smoking 1 pack per day (45 years) and has 2 drinks/week. Current Relevant Medications: lipitor, wellbutrin, protonix. Disbility limits him - cant climb stairs or climb ladders. Eats once a day at night. D: "whatever I can catch, kill, or grow" last week had some jumbalaya, has a lot of venison, does not like sugar (will have one piece of sugar in his coffee in the morning). Honoring hunger. Grows cucumbers, tomatoes, squash, zucchini. Pt reports hating salt and will use very minimal - cut sodium when he was younger due to fathers heart attack. Uses animal fat with venison to make sausage and vegetable oil or butter with everything else. Hasn't eaten at a restaurant in over 10 years. Discussed heart healthy eating and pulmonary MNT. Suggested adding more heart healthy fats and begin replacing the butter, having sausage less often to avoid the large amounts of saturated fat from the animal fat he is adding, and encouraged a variety of fruits and vegetables to make sure he is meeting his needs -  especially given he is only having one meal a day. Encouraged pt not to restrict portions as he is only eating one meal a day and his needs are higher with his lung conditions.      Intervention Plan   Intervention Prescribe, educate and counsel regarding individualized specific dietary modifications aiming towards targeted core components such as weight, hypertension, lipid management, diabetes, heart failure and other  comorbidities.;Nutrition handout(s) given to patient.    Expected Outcomes Short Term Goal: Understand basic principles of dietary content, such as calories, fat, sodium, cholesterol and nutrients.;Short Term Goal: A plan has been developed with personal nutrition goals set during dietitian appointment.;Long Term Goal: Adherence to prescribed nutrition plan.           Nutrition Assessments:  Nutrition Assessments - 04/13/20 1213      MEDFICTS Scores   Pre Score 33           MEDIFICTS Score Key:          ?70 Need to make dietary changes          40-70 Heart Healthy Diet         ? 40 Therapeutic Level Cholesterol Diet  Nutrition Goals Re-Evaluation:   Nutrition Goals Discharge (Final Nutrition Goals Re-Evaluation):   Psychosocial: Target Goals: Acknowledge presence or absence of significant depression and/or stress, maximize coping skills, provide positive support system. Participant is able to verbalize types and ability to use techniques and skills needed for reducing stress and depression.   Education: Depression - Provides group verbal and written instruction on the correlation between heart/lung disease and depressed mood, treatment options, and the stigmas associated with seeking treatment.   Cardiac Rehab from 06/15/2020 in [facility] Cardiac and Pulmonary Rehab  Date 04/13/20  Instruction Review Code 3- Needs Reinforcement  [need identified]      Education: Sleep Hygiene -Provides group verbal and written instruction about how sleep can affect your health.  Define sleep hygiene, discuss sleep cycles and impact of sleep habits. Review good sleep hygiene tips.     Education: Stress and Anxiety: - Provides group verbal and written instruction about the health risks of elevated stress and causes of high stress.  Discuss the correlation between heart/lung disease and anxiety and treatment options. Review healthy ways to manage with stress and anxiety.    Initial Review &  Psychosocial Screening:  Initial Psych Review & Screening - 04/12/20 1417      Initial Review   Current issues with History of Depression;Current Stress Concerns;Current Sleep Concerns    Source of Stress Concerns Chronic Illness;Retirement/disability;Family    Comments Mother has dementia and shares caregiving responsibility with sister (1 month at a time),  Been on disability since 2018 with first MI, Rabbit is eating garden, Does not sleep well at least twice a week only a few hours      [place]? No   Sister helps with mother, they lean on him for support, very independent   Concerns No support system      Barriers   Psychosocial barriers to participate in program The patient should benefit from training in stress management and relaxation.;Psychosocial barriers identified (see note)      Screening Interventions   Interventions Encouraged to exercise;To provide support and resources with identified psychosocial needs;Provide feedback about the scores to participant    Expected Outcomes Short Term goal: Utilizing psychosocial counselor, staff and physician to assist with identification of specific Stressors or current issues  interfering with healing process. Setting desired goal for each stressor or current issue identified.;Long Term Goal: Stressors or current issues are controlled or eliminated.;Short Term goal: Identification and review with participant of any Quality of Life or Depression concerns found by scoring the questionnaire.;Long Term goal: The participant improves quality of Life and PHQ9 Scores as seen by post scores and/or verbalization of changes           Quality of Life Scores:   Quality of Life - 04/13/20 1212      Quality of Life   Select Quality of Life      Quality of Life Scores   Health/Function Pre 17.5 %    Socioeconomic Pre 19.75 %    Psych/Spiritual Pre 19.5 %    Family Pre 14.5 %    GLOBAL Pre 18.11 %          Scores  of 19 and below usually indicate a poorer quality of life in these areas.  A difference of  2-3 points is a clinically meaningful difference.  A difference of 2-3 points in the total score of the Quality of Life Index has been associated with significant improvement in overall quality of life, self-image, physical symptoms, and general health in studies assessing change in quality of life.  PHQ-9: Recent Review Flowsheet Data    Depression screen [facility] 2/9 04/13/2020   Decreased Interest 2   Down, Depressed, Hopeless 0   PHQ - 2 Score 2   Altered sleeping 3   Tired, decreased energy 2   Change in appetite 2   Feeling bad or failure about yourself  0   Trouble concentrating 0   Moving slowly or fidgety/restless 0   Suicidal thoughts 0   PHQ-9 Score 9   Difficult doing work/chores Somewhat difficult     Interpretation of Total Score  Total Score Depression Severity:  1-4 = Minimal depression, 5-9 = Mild depression, 10-14 = Moderate depression, 15-19 = Moderately severe depression, 20-27 = Severe depression   Psychosocial Evaluation and Intervention:  Psychosocial Evaluation - 04/12/20 1428      Psychosocial Evaluation & Interventions   Interventions Stress management education;Encouraged to exercise with the program and follow exercise prescription    Comments [name] is coming into rehab after another stent placement.  He had his first heart event back in 2018 and has been on disability since and then retirement.  He has a history of depression following his divorce in 1993, but recently has been fine.  He is trying to quit smoking and currently on Wellbutrin for it.  He has lost his desire to smoke, but still working on the habit part.  He is a little leary of exercise as his father died during a stress test at [facility] with nurses looking on.  He does want to get stronger and sleep better.  He has noticed that he sleeps best on days that he is active and hopes this will  help more. He is very indpendent and does not lean on anyone, but rather they all lean on him.  His mother has dementia and he shares caregiving with his sister.  Last month, she started have seziures and they still have not found the cause.   Exercise will be a good stress relief for him as well.    Expected Outcomes Short: Exercise for stress relief Long: Build confidence for exercise    Continue Psychosocial Services  Follow up required by staff  Psychosocial Re-Evaluation:  Psychosocial Re-Evaluation    Row Name 05/09/20 1036 05/09/20 1047 06/17/20 1008         Psychosocial Re-Evaluation   Current issues with Current Sleep Concerns Current Sleep Concerns;Current Stress Concerns Current Sleep Concerns;Current Stress Concerns     Comments -- [name] says he has no new stress concerns.  He doesnt sleep well - wakes up early but isnt tired during the day Sleeping is hard and he will usually toss and turn at night and has sleep maintenance insomnia. Discussed sleep hygiene. He wakes up early and waits for the sun to come up. Last week his friend died, uncle committed suicide and killed his wife - pt reports having family for support. HE is now preparing for his friends kids to come over which is also stressful. 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 Stressed also because he is getting charged for his visits becuase they never sent it to [employer] - he will call again next week.     Expected Outcomes -- Short: continue to attend to help manage stress Long:  maintain positive attitude Short: continue to attend to help manage stress Long: maintain positive attitude     Interventions -- -- Stress management education;Relaxation education;Encouraged to attend Cardiac Rehabilitation for the exercise     Continue Psychosocial Services  -- -- Follow up required by staff     Comments -- -- Mother has dementia and shares caregiving responsibility with sister (1 month at a time),   Been on disability since 2018 with first MI, Rabbit is eating garden, Does not sleep well at least twice a week only a few hours. Last week his friend died, uncle committed suicide and killed his wife - pt reports having family for support. HE is now preparing for his friends kids to come over which is also stressful. 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 Stressed also because he is getting charged for his visits becuase they never sent it to [employer] - he will call again next week.Last week his friend died, uncle committed suicide and killed his wife - pt reports having family for support. HE is now preparing for his friends kids to come over which is also stressful. 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 Stressed also because he is getting charged for his visits becuase they never sent it to [employer] - he will call again next week.       Initial Review   Source of Stress Concerns -- -- Chronic Illness;Retirement/disability;Family;Financial            Psychosocial Discharge (Final Psychosocial Re-Evaluation):  Psychosocial Re-Evaluation - 06/17/20 1008      Psychosocial Re-Evaluation   Current issues with Current Sleep Concerns;Current Stress Concerns    Comments Sleeping is hard and he will usually toss and turn at night and has sleep maintenance insomnia. Discussed sleep hygiene. He wakes up early and waits for the sun to come up. Last week his friend died, uncle committed suicide and killed his wife - pt reports having family for support. HE is now preparing for his friends kids to come over which is also stressful. 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 Stressed also because he is getting charged for his visits becuase they never sent it to [employer] - he will call again next week.    Expected Outcomes Short: continue to attend to help manage stress Long: maintain positive attitude  Interventions Stress management education;Relaxation  education;Encouraged to attend Cardiac Rehabilitation for the exercise    Continue Psychosocial Services  Follow up required by staff    Comments Mother has dementia and shares caregiving responsibility with sister (1 month at a time),  Been on disability since 2018 with first MI, Rabbit is eating garden, Does not sleep well at least twice a week only a few hours. Last week his friend died, uncle committed suicide and killed his wife - pt reports having family for support. HE is now preparing for his friends kids to come over which is also stressful. 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 Stressed also because he is getting charged for his visits becuase they never sent it to [employer] - he will call again next week.Last week his friend died, uncle committed suicide and killed his wife - pt reports having family for support. HE is now preparing for his friends kids to come over which is also stressful. 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 Stressed also because he is getting charged for his visits becuase they never sent it to [employer] - he will call again next week.      Initial Review   Source of Stress Concerns Chronic Illness;Retirement/disability;Family;Financial           Vocational Rehabilitation: Provide vocational rehab assistance to qualifying candidates.   Vocational Rehab Evaluation & Intervention:  Vocational Rehab - 04/12/20 1417      Initial Vocational Rehab Evaluation & Intervention   Assessment shows need for Vocational Rehabilitation No           Education: Education Goals: Education classes will be provided on a variety of topics geared toward better understanding of heart health and risk factor modification. Participant will state understanding/return demonstration of topics presented as noted by education test scores.  Learning Barriers/Preferences:  Learning Barriers/Preferences - 04/12/20 1417      Learning  Barriers/Preferences   Learning Barriers Sight;Exercise Concerns   glasses for reading and outside, fearful of treadmill as father died during stress test   Learning Preferences Skilled Demonstration           General Cardiac Education Topics:  AED/CPR: - Group verbal and written instruction with the use of models to demonstrate the basic use of the AED with the basic ABC's of resuscitation.   Anatomy & Physiology of the Heart: - Group verbal and written instruction and models provide basic cardiac anatomy and physiology, with the coronary electrical and arterial systems. Review of Valvular disease and Heart Failure   Cardiac Procedures: - Group verbal and written instruction to review commonly prescribed medications for heart disease. Reviews the medication, class of the drug, and side effects. Includes the steps to properly store meds and maintain the prescription regimen. (beta blockers and nitrates)   Cardiac Rehab from 06/15/2020 in [facility] Cardiac and Pulmonary Rehab  Date 06/08/20  Educator SB  Instruction Review Code 1- Verbalizes Understanding      Cardiac Medications I: - Group verbal and written instruction to review commonly prescribed medications for heart disease. Reviews the medication, class of the drug, and side effects. Includes the steps to properly store meds and maintain the prescription regimen.   Cardiac Rehab from 06/15/2020 in [facility] Cardiac and Pulmonary Rehab  Date 05/11/20  Educator SB  Instruction Review Code 1- Verbalizes Understanding      Cardiac Medications II: -Group verbal and written instruction to review commonly prescribed medications for heart disease.  Reviews the medication, class of the drug, and side effects. (all other drug classes)   Cardiac Rehab from 06/15/2020 in [facility] Cardiac and Pulmonary Rehab  Date 04/13/20  Instruction Review Code 3- Needs Reinforcement  [need identified]       Go Sex-Intimacy & Heart Disease, Get SMART - Goal  Setting: - Group verbal and written instruction through game format to discuss heart disease and the return to sexual intimacy. Provides group verbal and written material to discuss and apply goal setting through the application of the S.M.A.R.T. Method.   Cardiac Rehab from 06/15/2020 in [facility] Cardiac and Pulmonary Rehab  Date 06/08/20  Educator SB  Instruction Review Code 1- Verbalizes Understanding      Other Matters of the Heart: - Provides group verbal, written materials and models to describe Stable Angina and Peripheral Artery. Includes description of the disease process and treatment options available to the cardiac patient.   Infection Prevention: - Provides verbal and written material to individual with discussion of infection control including proper hand washing and proper equipment cleaning during exercise session.   Cardiac Rehab from 06/15/2020 in [facility] Cardiac and Pulmonary Rehab  Date 04/13/20  Educator [facility]  Instruction Review Code 1- Verbalizes Understanding      Falls Prevention: - Provides verbal and written material to individual with discussion of falls prevention and safety.   Cardiac Rehab from 06/15/2020 in [facility] Cardiac and Pulmonary Rehab  Date 04/13/20  Educator [facility]  Instruction Review Code 1- Verbalizes Understanding      Other: -Provides group and verbal instruction on various topics (see comments)   Knowledge Questionnaire Score:  Knowledge Questionnaire Score - 04/13/20 1213      Knowledge Questionnaire Score   Pre Score 22/26 Education Focus: Depression, heart failure, exercise           Core Components/Risk Factors/Patient Goals at Admission:  Personal Goals and Risk Factors at Admission - 04/13/20 1214      Core Components/Risk Factors/Patient Goals on Admission    Weight Management Yes;Weight Maintenance    Intervention Weight Management: Develop a combined nutrition and exercise program designed to reach desired caloric intake, while  maintaining appropriate intake of nutrient and fiber, sodium and fats, and appropriate energy expenditure required for the weight goal.;Weight Management: Provide education and appropriate resources to help participant work on and attain dietary goals.    Admit Weight 157 lb 11.2 oz (71.5 kg)    Goal Weight: Short Term 158 lb (71.7 kg)    Goal Weight: Long Term 158 lb (71.7 kg)    Expected Outcomes Short Term: Continue to assess and modify interventions until short term weight is achieved;Long Term: Adherence to nutrition and physical activity/exercise program aimed toward attainment of established weight goal;Weight Maintenance: Understanding of the daily nutrition guidelines, which includes 25-35% calories from fat, 7% or less cal from saturated fats, less than 268m cholesterol, less than 1.5gm of sodium, & 5 or more servings of fruits and vegetables daily    Tobacco Cessation Yes    Number of packs per day 0.5    Intervention Assist the participant in steps to quit. Provide individualized education and counseling about committing to Tobacco Cessation, relapse prevention, and pharmacological support that can be provided by physician.;OAdvice worker assist with locating and accessing local/national Quit Smoking programs, and support quit date choice.    Expected Outcomes Short Term: Will demonstrate readiness to quit, by selecting a quit date.;Short Term: Will quit all tobacco product use, adhering to  prevention of relapse plan.;Long Term: Complete abstinence from all tobacco products for at least 12 months from quit date.    Lipids Yes    Intervention Provide education and support for participant on nutrition & aerobic/resistive exercise along with prescribed medications to achieve LDL <58m, HDL >46m    Expected Outcomes Short Term: Participant states understanding of desired cholesterol values and is compliant with medications prescribed. Participant is following exercise  prescription and nutrition guidelines.;Long Term: Cholesterol controlled with medications as prescribed, with individualized exercise RX and with personalized nutrition plan. Value goals: LDL < 7075mHDL > 40 mg.           Education:Diabetes - Individual verbal and written instruction to review signs/symptoms of diabetes, desired ranges of glucose level fasting, after meals and with exercise. Acknowledge that pre and post exercise glucose checks will be done for 3 sessions at entry of program.   Education: Know Your Numbers and Risk Factors: -Group verbal and written instruction about important numbers in your health.  Discussion of what are risk factors and how they play a role in the disease process.  Review of Cholesterol, Blood Pressure, Diabetes, and BMI and the role they play in your overall health.   [facility]  Date 04/13/20  Instruction Review Code 3- Needs Reinforcement  [need identified]      Core Components/Risk Factors/Patient Goals Review:   Goals and Risk Factor Review    Row Name 05/09/20 1017 05/09/20 1019 06/17/20 1003         Core Components/Risk Factors/Patient Goals Review   Personal Goals Review Weight Management/Obesity;Tobacco Cessation;Hypertension -- Tobacco Cessation;Hypertension     Review [facility] taking all meds except the "quit smoking" one.  He is down to one cigarette per day with morning coffee. He says he would like to lose weight as he has gained since his heart attack.  He is trying to eat heart healthy. Quit smoking in July - on medication to help with this and says while on it he is doing well, but next week he will be out of them and doesn't know how he will do. Does not have a way to check BP at home.  BP has been around 130s over 70s at rehab, taking BP medication as directed.     Expected Outcomes -- Short:  continue to take meds as directed Long:  manage risk factors Short:  continue to take  meds as directed Long:  manage risk factors            Core Components/Risk Factors/Patient Goals at Discharge (Final Review):   Goals and Risk Factor Review - 06/17/20 1003      Core Components/Risk Factors/Patient Goals Review   Personal Goals Review Tobacco Cessation;Hypertension    Review Quit smoking in July - on medication to help with this and says while on it he is doing well, but next week he will be out of them and doesn't know how he will do. Does not have a way to check BP at home.  BP has been around 130s over 70s at rehab, taking BP medication as directed.    Expected Outcomes Short:  continue to take meds as directed Long:  manage risk factors           ITP Comments:  ITP Comments    Row Name 04/12/20 1457 04/13/20 1201 04/13/20 1228 04/20/20 1015 04/27/20 1115   ITP Comments Completed virtual orientation today.  EP evaluation is scheduled for Wed 6/30 at 11am.  Documentation for diagnosis can be found in [facility] encounter 03/07/20. Completed 6MWT and gym orientation. Initial ITP created and sent for review to Dr. Emily Filbert, Medical Director. [place] is a current tobacco user. Intervention for tobacco cessation was provided at the initial medical review. He was asked about readiness to quit and reported trying to quit current, has reduced to half a pack a day and using Wellbutrin . Patient was advised and educated about tobacco cessation using combination therapy, tobacco cessation classes, quit line, and quit smoking apps. Patient demonstrated understanding of this material. Staff will continue to provide encouragement and follow up with the patient throughout the program. First full day of exercise!  Patient was oriented to gym and equipment including functions, settings, policies, and procedures.  Patient's individual exercise prescription and treatment plan were reviewed.  All starting workloads were established based on the results of the 6 minute walk test done at initial  orientation visit.  The plan for exercise progression was also introduced and progression will be customized based on patient's performance and goals 30 Day review completed. Medical Director ITP review done, changes made as directed, and signed approval by Medical Director. New to program   Row Name 05/25/20 0757 05/31/20 0859 06/22/20 1658       ITP Comments 30 Day review completed. Medical Director ITP review done, changes made as directed, and signed approval by Medical Director. Completed initial RD evaluation 30 day review completed. ITP sent to Dr. Emily Filbert, Medical Director of Cardiac and Pulmonary Rehab. Continue with ITP unless changes are made by physician.            Comments: 30 day review

## 2020-06-24 ENCOUNTER — Telehealth: Payer: Self-pay | Admitting: *Deleted

## 2020-06-24 NOTE — Telephone Encounter (Signed)
Rama called to say he won't be in again until he gets his bills fixed. He understood that all was covered and he has bills for over 200 dollars.  He is calling financial services to be sure both his insurances are being biled for the services.

## 2020-07-06 ENCOUNTER — Telehealth: Payer: Self-pay

## 2020-07-06 NOTE — Telephone Encounter (Signed)
Called to check in on pt as he has not been to rehab for a while, at the time he was close to graduating and asked if he would like to discharge at this time. LMOM

## 2020-07-07 ENCOUNTER — Other Ambulatory Visit: Payer: Self-pay | Admitting: Nurse Practitioner

## 2020-07-07 DIAGNOSIS — I739 Peripheral vascular disease, unspecified: Secondary | ICD-10-CM

## 2020-07-15 ENCOUNTER — Encounter: Payer: Medicare Other | Attending: Cardiovascular Disease

## 2020-07-15 DIAGNOSIS — Z955 Presence of coronary angioplasty implant and graft: Secondary | ICD-10-CM | POA: Insufficient documentation

## 2020-07-15 DIAGNOSIS — I2510 Atherosclerotic heart disease of native coronary artery without angina pectoris: Secondary | ICD-10-CM | POA: Insufficient documentation

## 2020-07-15 DIAGNOSIS — I739 Peripheral vascular disease, unspecified: Secondary | ICD-10-CM | POA: Insufficient documentation

## 2020-07-15 DIAGNOSIS — F17210 Nicotine dependence, cigarettes, uncomplicated: Secondary | ICD-10-CM | POA: Insufficient documentation

## 2020-07-15 DIAGNOSIS — E785 Hyperlipidemia, unspecified: Secondary | ICD-10-CM | POA: Insufficient documentation

## 2020-07-15 DIAGNOSIS — J439 Emphysema, unspecified: Secondary | ICD-10-CM | POA: Insufficient documentation

## 2020-07-15 DIAGNOSIS — Z7982 Long term (current) use of aspirin: Secondary | ICD-10-CM | POA: Insufficient documentation

## 2020-07-15 DIAGNOSIS — Z7902 Long term (current) use of antithrombotics/antiplatelets: Secondary | ICD-10-CM | POA: Insufficient documentation

## 2020-07-15 DIAGNOSIS — Z79899 Other long term (current) drug therapy: Secondary | ICD-10-CM | POA: Insufficient documentation

## 2020-07-15 DIAGNOSIS — I252 Old myocardial infarction: Secondary | ICD-10-CM | POA: Insufficient documentation

## 2020-07-20 ENCOUNTER — Ambulatory Visit: Payer: Medicare Other

## 2020-07-20 ENCOUNTER — Other Ambulatory Visit: Payer: Self-pay

## 2020-07-20 ENCOUNTER — Encounter: Payer: Self-pay | Admitting: *Deleted

## 2020-07-20 ENCOUNTER — Ambulatory Visit (INDEPENDENT_AMBULATORY_CARE_PROVIDER_SITE_OTHER): Payer: Medicare Other

## 2020-07-20 DIAGNOSIS — I739 Peripheral vascular disease, unspecified: Secondary | ICD-10-CM

## 2020-07-20 DIAGNOSIS — Z955 Presence of coronary angioplasty implant and graft: Secondary | ICD-10-CM

## 2020-07-20 NOTE — Progress Notes (Signed)
Cardiac Individual Treatment Plan  Patient Details  Name: [name] MRN: [id number] Date of Birth: 09/13/1961 Referring Provider:     Cardiac Rehab from 04/13/2020 in [facility] Cardiac and Pulmonary Rehab  Referring Provider Kathlyn Sacramento MD      Initial Encounter Date:    Cardiac Rehab from 04/13/2020 in [facility] Cardiac and Pulmonary Rehab  Date 04/13/20      Visit Diagnosis: Status post coronary artery stent placement  Patient's Home Medications on Admission:  Current Outpatient Medications:  .  albuterol (PROVENTIL HFA;VENTOLIN HFA) 108 (90 Base) MCG/ACT inhaler, Inhale 2 puffs into the lungs every 6 (six) hours as needed for wheezing or shortness of breath., Disp: 1 Inhaler, Rfl: 2 .  aspirin 81 MG chewable tablet, Chew 1 tablet (81 mg total) by mouth daily., Disp: 30 tablet, Rfl: 0 .  atorvastatin (LIPITOR) 80 MG tablet, TAKE 1 TABLET BY MOUTH ONCE DAILY AT  6PM, Disp: 90 tablet, Rfl: 0 .  buPROPion (WELLBUTRIN SR) 150 MG 12 hr tablet, Take 1 tablet (150 mg total) once daily for 3 days, then increase to 1 tablet (150 mg total) twice daily thereafter., Disp: 60 tablet, Rfl: 2 .  clopidogrel (PLAVIX) 75 MG tablet, Take 1 tablet by mouth once daily, Disp: 90 tablet, Rfl: 3 .  ezetimibe (ZETIA) 10 MG tablet, Take 1 tablet by mouth once daily, Disp: 90 tablet, Rfl: 0 .  metoprolol succinate (TOPROL-XL) 25 MG 24 hr tablet, TAKE 1 TABLET BY MOUTH ONCE DAILY*TAKE WITH OR IMMEDIATELY FOLLOWING A MEAL*, Disp: 90 tablet, Rfl: 0 .  nitroGLYCERIN (NITROSTAT) 0.4 MG SL tablet, DISSOLVE ONE TABLET UNDER THE TONGUE EVERY FIVE MINUTES AS NEEDED FOR CHEST PAIN, Disp: 25 tablet, Rfl: 0 .  pantoprazole (PROTONIX) 40 MG tablet, Take 1 tablet (40 mg total) by mouth daily., Disp: 30 tablet, Rfl: 5  Past Medical History: Past Medical History:  Diagnosis Date  . COPD (chronic obstructive pulmonary disease) ([place])   . Coronary artery disease 08/2017   a. 08/2017 Anterolateral STEMI/PCI: LAD mod  prox dzs, D1 99 (PCI/DES), RCA mod dist dzs, EF nl; b. 02/2020 PCI: LM nl, LAD 70 (iFR abnl - 0.86 ->2.5x22 Resolute Onyx DES), D1 10ost in-stent, LCX mild dzs, OM1/3 min irres, RCA 5m EF 55-65%.  . Diastolic dysfunction    a. 09/2017 Echo: EF 55-60%, severe mid antlat HK. Gr1 DD. Nl RV fxn. PASP 240mg.  . [place]  . Hyperlipidemia   . Medical history non-contributory   . [place]   a. Status post aortobifemoral bypass in 2015; b. 07/2019 ABI: R 1.02, L 1.12.  . Tobacco use     Tobacco Use: Social History   Tobacco Use  Smoking Status Current Every Day Smoker  . Packs/day: 1.00  . Years: 45.00  . Pack years: 45.00  . Types: Cigarettes  Smokeless Tobacco Never Used  Tobacco Comment   8/18 still one cigarette a day with morning coffee    Labs: Recent Review Flowsheet Data    Labs for ITP Cardiac and Pulmonary Rehab Latest Ref Rng & Units 09/13/2017 09/14/2017 12/20/2017 06/23/2019 03/04/2020   Cholestrol 100 - 199 mg/dL 192 171 170 170 134   LDLCALC 0 - 99 mg/dL 100(H) 85 94 80 54   LDLDIRECT 0 - 99 mg/dL - - - 87.4 55   HDL >39 mg/dL 78 68 51 67 65   Trlycerides 0 - 149 mg/dL 68 91 123 113 75   Hemoglobin  A1c 4.8 - 5.6 % 5.5 - - - -       Exercise Target Goals: Exercise Program Goal: Individual exercise prescription set using results from initial 6 min walk test and THRR while considering  patient's activity barriers and safety.   Exercise Prescription Goal: Initial exercise prescription builds to 30-45 minutes a day of aerobic activity, 2-3 days per week.  Home exercise guidelines will be given to patient during program as part of exercise prescription that the participant will acknowledge.   Education: Aerobic Exercise & Resistance Training: - Gives group verbal and written instruction on the various components of exercise. Focuses on aerobic and resistive training programs and the benefits of this training and how to safely  progress through these programs..   Cardiac Rehab from 06/15/2020 in [facility] Cardiac and Pulmonary Rehab  Date 06/08/20  Educator [facility]  Instruction Review Code 1- Verbalizes Understanding      Education: Exercise & Equipment Safety: - Individual verbal instruction and demonstration of equipment use and safety with use of the equipment.   Cardiac Rehab from 06/15/2020 in [facility] Cardiac and Pulmonary Rehab  Date 04/13/20  Educator [facility]  Instruction Review Code 1- Verbalizes Understanding      Education: Exercise Physiology & General Exercise Guidelines: - Group verbal and written instruction with models to review the exercise physiology of the cardiovascular system and associated critical values. Provides general exercise guidelines with specific guidelines to those with heart or lung disease.    Cardiac Rehab from 06/15/2020 in [facility] Cardiac and Pulmonary Rehab  Date 04/13/20  Instruction Review Code 3- Needs Reinforcement  [need identified]      Education: Flexibility, Balance, Mind/Body Relaxation: Provides group verbal/written instruction on the benefits of flexibility and balance training, including mind/body exercise modes such as yoga, pilates and tai chi.  Demonstration and skill practice provided.   Cardiac Rehab from 06/15/2020 in [facility] Cardiac and Pulmonary Rehab  Date 06/15/20  Educator as  Instruction Review Code 1- Verbalizes Understanding      Activity Barriers & Risk Stratification:  Activity Barriers & Cardiac Risk Stratification - 04/13/20 1209      Activity Barriers & Cardiac Risk Stratification   Activity Barriers Deconditioning;Muscular Weakness;Shortness of Breath;Balance Concerns;Joint Problems;Other (comment)    Comments blood clot behind right knee, numbness in feet    Cardiac Risk Stratification High           6 Minute Walk:  6 Minute Walk    Row Name 04/13/20 1201         6 Minute Walk   Phase Initial     Distance 740 feet     Walk Time 6 minutes     # of  Rest Breaks 0     MPH 1.4     METS 2.8     RPE 11     Perceived Dyspnea  3     VO2 Peak 9.79     Symptoms Yes (comment)     Comments mask difficult to breathe in, SOB, leg/claudication pain 8/10     Resting HR 78 bpm     Resting BP 116/64     Resting Oxygen Saturation  97 %     Exercise Oxygen Saturation  during 6 min walk 96 %     Max Ex. HR 94 bpm     Max Ex. BP 142/74     2 Minute Post BP 138/80            Oxygen Initial Assessment:  Oxygen Re-Evaluation:  Oxygen Re-Evaluation    Row Name 05/09/20 1004             Program Oxygen Prescription   Program Oxygen Prescription None         Home Oxygen   Home Oxygen Device None       Sleep Oxygen Prescription None       Home Exercise Oxygen Prescription None       Home Resting Oxygen Prescription None       Compliance with Home Oxygen Use Yes         Goals/Expected Outcomes   Short Term Goals To learn and understand importance of monitoring SPO2 with pulse oximeter and demonstrate accurate use of the pulse oximeter.;To learn and understand importance of maintaining oxygen saturations>88%;To learn and demonstrate proper pursed lip breathing techniques or other breathing techniques.;To learn and demonstrate proper use of respiratory medications       Long  Term Goals Maintenance of O2 saturations>88%;Compliance with respiratory medication;Demonstrates proper use of MDI's;Exhibits proper breathing techniques, such as pursed lip breathing or other method taught during program session;Verbalizes importance of monitoring SPO2 with pulse oximeter and return demonstration       Comments Spoke to patient about COPD Action Plan. Reviewed and talked with the patient about the different levels of COPD severity that they should review daily and the steps they can take to manage their COPD. Went over pertinent actions for the patient can take when they feel like they are having a COPD exacerbation and the necessary treatment if needed. If  patient has any changes with their breathing or feel like they are in a different zone of severity to inform staff for re-evaluation. Patient verbalizes understanding. Copy given to patient.       Goals/Expected Outcomes Short: Follow COPD action plan. Long: Report any changes and severity of COPD.              Oxygen Discharge (Final Oxygen Re-Evaluation):  Oxygen Re-Evaluation - 05/09/20 1004      Program Oxygen Prescription   Program Oxygen Prescription None      Home Oxygen   Home Oxygen Device None    Sleep Oxygen Prescription None    Home Exercise Oxygen Prescription None    Home Resting Oxygen Prescription None    Compliance with Home Oxygen Use Yes      Goals/Expected Outcomes   Short Term Goals To learn and understand importance of monitoring SPO2 with pulse oximeter and demonstrate accurate use of the pulse oximeter.;To learn and understand importance of maintaining oxygen saturations>88%;To learn and demonstrate proper pursed lip breathing techniques or other breathing techniques.;To learn and demonstrate proper use of respiratory medications    Long  Term Goals Maintenance of O2 saturations>88%;Compliance with respiratory medication;Demonstrates proper use of MDI's;Exhibits proper breathing techniques, such as pursed lip breathing or other method taught during program session;Verbalizes importance of monitoring SPO2 with pulse oximeter and return demonstration    Comments Spoke to patient about COPD Action Plan. Reviewed and talked with the patient about the different levels of COPD severity that they should review daily and the steps they can take to manage their COPD. Went over pertinent actions for the patient can take when they feel like they are having a COPD exacerbation and the necessary treatment if needed. If patient has any changes with their breathing or feel like they are in a different zone of severity to inform staff for re-evaluation. Patient verbalizes  understanding. Copy given  to patient.    Goals/Expected Outcomes Short: Follow COPD action plan. Long: Report any changes and severity of COPD.           Initial Exercise Prescription:  Initial Exercise Prescription - 04/13/20 1200      Date of Initial Exercise RX and Referring Provider   Date 04/13/20    Referring Provider Kathlyn Sacramento MD      Recumbant Bike   Level 1    RPM 50    Watts 18    Minutes 15    METs 2.8      NuStep   Level 1    SPM 80    Minutes 15    METs 2.8      Arm Ergometer   Level 1    Watts 34    RPM 25    Minutes 15    METs 2.8      Prescription Details   Frequency (times per week) 3    Duration Progress to 30 minutes of continuous aerobic without signs/symptoms of physical distress      Intensity   THRR 40-80% of Max Heartrate 112-146    Ratings of Perceived Exertion 11-13    Perceived Dyspnea 0-4      Progression   Progression Continue to progress workloads to maintain intensity without signs/symptoms of physical distress.      Resistance Training   Training Prescription Yes    Weight 3 lb    Reps 10-15           Perform Capillary Blood Glucose checks as needed.  Exercise Prescription Changes:  Exercise Prescription Changes    Row Name 04/13/20 1200 05/03/20 1300 05/09/20 1000 05/18/20 1700 05/31/20 1200     Response to Exercise   Blood Pressure (Admit) 116/64 122/66 -- 144/90 142/70   Blood Pressure (Exercise) 142/74 160/74 -- 132/64 164/82   Blood Pressure (Exit) 138/80 132/80 -- 118/76 122/80   Heart Rate (Admit) 78 bpm 62 bpm -- 86 bpm 94 bpm   Heart Rate (Exercise) 94 bpm 110 bpm -- 107 bpm 113 bpm   Heart Rate (Exit) -- 88 bpm -- 83 bpm 89 bpm   Oxygen Saturation (Admit) 97 % -- -- -- --   Oxygen Saturation (Exercise) 96 % -- -- -- --   Rating of Perceived Exertion (Exercise) 11 12 -- 13 13   Perceived Dyspnea (Exercise) 3 -- -- -- --   Symptoms SOB, mask harder to breathe, leg pain 8/10 none -- none none    Comments walk test results -- -- -- --   Duration -- Continue with 30 min of aerobic exercise without signs/symptoms of physical distress. -- Continue with 30 min of aerobic exercise without signs/symptoms of physical distress. Continue with 30 min of aerobic exercise without signs/symptoms of physical distress.   Intensity -- THRR unchanged -- -- --     Progression   Progression -- Continue to progress workloads to maintain intensity without signs/symptoms of physical distress. -- Continue to progress workloads to maintain intensity without signs/symptoms of physical distress. Continue to progress workloads to maintain intensity without signs/symptoms of physical distress.   Average METs -- 2.7 -- 2.77 2.78     Resistance Training   Training Prescription -- Yes -- Yes Yes   Weight -- 4 lb -- 4 lb 4 lb   Reps -- 10-15 -- 10-15 10-15     Interval Training   Interval Training -- No -- No No  Recumbant Bike   Level -- 1 -- 7 7   RPM -- 50 -- -- 50   Watts -- -- -- 25 25   Minutes -- 15 -- 15 15   METs -- -- -- 3.3 3.06     NuStep   Level -- 2 -- 3 --   SPM -- 80 -- -- --   Minutes -- 15 -- 15 --   METs -- 1.8 -- 1.8 --     Arm Ergometer   Level -- -- -- 1 1   Minutes -- -- -- 15 15   METs -- -- -- 2.1 2.5     Home Exercise Plan   Plans to continue exercise at -- -- Home (comment) Home (comment) Home (comment)   Frequency -- -- Add 1 additional day to program exercise sessions. Add 1 additional day to program exercise sessions. Add 1 additional day to program exercise sessions.   Initial Home Exercises Provided -- -- 05/09/20 05/09/20 05/09/20   Row Name 06/14/20 1100             Response to Exercise   Blood Pressure (Admit) 130/82       Blood Pressure (Exercise) 152/72       Blood Pressure (Exit) 108/76       Heart Rate (Admit) 98 bpm       Heart Rate (Exercise) 136 bpm       Heart Rate (Exit) 75 bpm       Rating of Perceived Exertion (Exercise) 14       Symptoms  none       Duration Continue with 30 min of aerobic exercise without signs/symptoms of physical distress.       Intensity THRR unchanged         Progression   Progression Continue to progress workloads to maintain intensity without signs/symptoms of physical distress.       Average METs 3.36         Resistance Training   Training Prescription Yes       Weight 5 lb       Reps 10-15         Interval Training   Interval Training No         Recumbant Bike   Level 8       Watts 41       Minutes 15       METs 3.68         NuStep   Level 5       Minutes 15       METs 2.9         Arm Ergometer   Level 4       Minutes 15       METs 3.5         Home Exercise Plan   Plans to continue exercise at Home (comment)       Frequency Add 1 additional day to program exercise sessions.       Initial Home Exercises Provided 05/09/20              Exercise Comments:   Exercise Goals and Review:  Exercise Goals    Row Name 04/13/20 1211             Exercise Goals   Increase Physical Activity Yes       Intervention Provide advice, education, support and counseling about physical activity/exercise needs.;Develop an individualized exercise prescription for aerobic and resistive training  based on initial evaluation findings, risk stratification, comorbidities and participant's personal goals.       Expected Outcomes Short Term: Attend rehab on a regular basis to increase amount of physical activity.;Long Term: Add in home exercise to make exercise part of routine and to increase amount of physical activity.;Long Term: Exercising regularly at least 3-5 days a week.       Increase Strength and Stamina Yes       Intervention Provide advice, education, support and counseling about physical activity/exercise needs.;Develop an individualized exercise prescription for aerobic and resistive training based on initial evaluation findings, risk stratification, comorbidities and participant's  personal goals.       Expected Outcomes Short Term: Increase workloads from initial exercise prescription for resistance, speed, and METs.;Short Term: Perform resistance training exercises routinely during rehab and add in resistance training at home;Long Term: Improve cardiorespiratory fitness, muscular endurance and strength as measured by increased METs and functional capacity (6MWT)       Able to understand and use rate of perceived exertion (RPE) scale Yes       Intervention Provide education and explanation on how to use RPE scale       Expected Outcomes Short Term: Able to use RPE daily in rehab to express subjective intensity level;Long Term:  Able to use RPE to guide intensity level when exercising independently       Able to understand and use Dyspnea scale Yes       Intervention Provide education and explanation on how to use Dyspnea scale       Expected Outcomes Short Term: Able to use Dyspnea scale daily in rehab to express subjective sense of shortness of breath during exertion;Long Term: Able to use Dyspnea scale to guide intensity level when exercising independently       Knowledge and understanding of Target Heart Rate Range (THRR) Yes       Intervention Provide education and explanation of THRR including how the numbers were predicted and where they are located for reference       Expected Outcomes Short Term: Able to state/look up THRR;Short Term: Able to use daily as guideline for intensity in rehab;Long Term: Able to use THRR to govern intensity when exercising independently       Able to check pulse independently Yes       Intervention Provide education and demonstration on how to check pulse in carotid and radial arteries.;Review the importance of being able to check your own pulse for safety during independent exercise       Expected Outcomes Short Term: Able to explain why pulse checking is important during independent exercise;Long Term: Able to check pulse independently and  accurately       Understanding of Exercise Prescription Yes       Intervention Provide education, explanation, and written materials on patient's individual exercise prescription       Expected Outcomes Short Term: Able to explain program exercise prescription;Long Term: Able to explain home exercise prescription to exercise independently              Exercise Goals Re-Evaluation :  Exercise Goals Re-Evaluation    Row Name 04/20/20 1017 05/03/20 1358 05/09/20 1022 05/18/20 1825 05/31/20 1255     Exercise Goal Re-Evaluation   Exercise Goals Review Increase Physical Activity;Able to understand and use rate of perceived exertion (RPE) scale;Knowledge and understanding of Target Heart Rate Range (THRR);Understanding of Exercise Prescription;Increase Strength and Stamina;Able to check pulse independently Increase Physical Activity;Increase  Strength and Stamina;Understanding of Exercise Prescription Increase Physical Activity;Increase Strength and Stamina;Understanding of Exercise Prescription Increase Physical Activity;Increase Strength and Stamina;Understanding of Exercise Prescription Increase Physical Activity;Increase Strength and Stamina;Understanding of Exercise Prescription   Comments Reviewed RPE and dyspnea scales, THR and program prescription with pt today.  Pt voiced understanding and was given a copy of goals to take home. Robbert is tolerating exercise well so far.  He does not want to try a TM.  Staff will monitor progress. Reviewed home exercise with pt today.  Pt plans to ride a bike for exercise.  Reviewed THR, pulse, RPE, sign and symptoms, pulse oximetery and when to call 911 or MD.  Also discussed weather considerations and indoor options.  Pt voiced understanding. Jeramy is doing well in rehab. He has already increased his hand weights to 4 lbs and on Level 3 on the NuStep. Staff will continue to monitor. Rishan has progressed to level 4 on NS and level 7 on RB.  He works at lower end of THR  range, but still is only on seated machines.  Staff will monitor progress.   Expected Outcomes Short: Use RPE daily to regulate intensity. Long: Follow program prescription in THR. Short: continue to attend consistently Long: increase overall stamina Short:  add exercise outside of program sessions  Long: increase stamina and maintain exercise on his own Short: Continue attending rehab consistently Long: Increase MET level/ increase strength/ stamina Short:  continue to increase workloads as tolerated Long: improve stamina and MET level   Row Name 06/14/20 1159 06/17/20 1016           Exercise Goal Re-Evaluation   Exercise Goals Review Increase Physical Activity;Increase Strength and Stamina;Understanding of Exercise Prescription Increase Physical Activity;Increase Strength and Stamina;Understanding of Exercise Prescription      Comments Randy continues to do well in rehab.  He is now up to level 8 on the bike and level 4 on the Arm Crank.  We will continue to monitor his progress. Janos continues to do well in rehab.  He is active outside of rehab and does not do any structured exercise. Pt has many stressors right now and discussed how exercise could help - especially things such as yoga.      Expected Outcomes Short: Increase hand weights Long: Continue to improve stamina. Short: exercise outside of rehab Long: Continue to improve stamina.             Discharge Exercise Prescription (Final Exercise Prescription Changes):  Exercise Prescription Changes - 06/14/20 1100      Response to Exercise   Blood Pressure (Admit) 130/82    Blood Pressure (Exercise) 152/72    Blood Pressure (Exit) 108/76    Heart Rate (Admit) 98 bpm    Heart Rate (Exercise) 136 bpm    Heart Rate (Exit) 75 bpm    Rating of Perceived Exertion (Exercise) 14    Symptoms none    Duration Continue with 30 min of aerobic exercise without signs/symptoms of physical distress.    Intensity THRR unchanged      Progression    Progression Continue to progress workloads to maintain intensity without signs/symptoms of physical distress.    Average METs 3.36      Resistance Training   Training Prescription Yes    Weight 5 lb    Reps 10-15      Interval Training   Interval Training No      Recumbant Bike   Level 8    Watts  41    Minutes 15    METs 3.68      NuStep   Level 5    Minutes 15    METs 2.9      Arm Ergometer   Level 4    Minutes 15    METs 3.5      Home Exercise Plan   Plans to continue exercise at Home (comment)    Frequency Add 1 additional day to program exercise sessions.    Initial Home Exercises Provided 05/09/20           Nutrition:  Target Goals: Understanding of nutrition guidelines, daily intake of sodium <1560m, cholesterol <2068m calories 30% from fat and 7% or less from saturated fats, daily to have 5 or more servings of fruits and vegetables.  Education: Controlling Sodium/Reading Food Labels -Group verbal and written material supporting the discussion of sodium use in heart healthy nutrition. Review and explanation with models, verbal and written materials for utilization of the food label.   Education: General Nutrition Guidelines/Fats and Fiber: -Group instruction provided by verbal, written material, models and posters to present the general guidelines for heart healthy nutrition. Gives an explanation and review of dietary fats and fiber.   Biometrics:  Pre Biometrics - 04/13/20 1212      Pre Biometrics   Height 5' 6.75" (1.695 m)    Weight 157 lb 11.2 oz (71.5 kg)    BMI (Calculated) 24.9    Single Leg Stand 30 seconds            Nutrition Therapy Plan and Nutrition Goals:  Nutrition Therapy & Goals - 05/31/20 0832      Nutrition Therapy   Diet Heart healthy, low Na, pulmonary MNT    Drug/Food Interactions Statins/Certain Fruits    Protein (specify units) 80-85g    Fiber 30 grams    Whole Grain Foods 3 servings    Saturated Fats 12 max. grams     Fruits and Vegetables 5 servings/day    Sodium 1.5 grams      Personal Nutrition Goals   Nutrition Goal ST: add heart healthy fats, experiment with new fruits and vegetables, LT: Weight loss (feeks like he is carrying around a watermelon) - gained 20 pounds - getting much less exercise    Comments Pt primary diagnosis for Cardiac Rehab is s/p coronary artery placement. Pt also presents with CAD, HLD, PAD, STEMI, COPD - emphysema, and GERD. Pt is a current smoker - pt reports smoking 1 pack per day (45 years) and has 2 drinks/week. Current Relevant Medications: lipitor, wellbutrin, protonix. Disbility limits him - cant climb stairs or climb ladders. Eats once a day at night. D: "whatever I can catch, kill, or grow" last week had some jumbalaya, has a lot of venison, does not like sugar (will have one piece of sugar in his coffee in the morning). Honoring hunger. Grows cucumbers, tomatoes, squash, zucchini. Pt reports hating salt and will use very minimal - cut sodium when he was younger due to fathers heart attack. Uses animal fat with venison to make sausage and vegetable oil or butter with everything else. Hasn't eaten at a restaurant in over 10 years. Discussed heart healthy eating and pulmonary MNT. Suggested adding more heart healthy fats and begin replacing the butter, having sausage less often to avoid the large amounts of saturated fat from the animal fat he is adding, and encouraged a variety of fruits and vegetables to make sure he is meeting his needs -  especially given he is only having one meal a day. Encouraged pt not to restrict portions as he is only eating one meal a day and his needs are higher with his lung conditions.      Intervention Plan   Intervention Prescribe, educate and counsel regarding individualized specific dietary modifications aiming towards targeted core components such as weight, hypertension, lipid management, diabetes, heart failure and other comorbidities.;Nutrition  handout(s) given to patient.    Expected Outcomes Short Term Goal: Understand basic principles of dietary content, such as calories, fat, sodium, cholesterol and nutrients.;Short Term Goal: A plan has been developed with personal nutrition goals set during dietitian appointment.;Long Term Goal: Adherence to prescribed nutrition plan.           Nutrition Assessments:  Nutrition Assessments - 04/13/20 1213      MEDFICTS Scores   Pre Score 33           MEDIFICTS Score Key:          ?70 Need to make dietary changes          40-70 Heart Healthy Diet         ? 40 Therapeutic Level Cholesterol Diet  Nutrition Goals Re-Evaluation:   Nutrition Goals Discharge (Final Nutrition Goals Re-Evaluation):   Psychosocial: Target Goals: Acknowledge presence or absence of significant depression and/or stress, maximize coping skills, provide positive support system. Participant is able to verbalize types and ability to use techniques and skills needed for reducing stress and depression.   Education: Depression - Provides group verbal and written instruction on the correlation between heart/lung disease and depressed mood, treatment options, and the stigmas associated with seeking treatment.   Cardiac Rehab from 06/15/2020 in [facility] Cardiac and Pulmonary Rehab  Date 04/13/20  Instruction Review Code 3- Needs Reinforcement  [need identified]      Education: Sleep Hygiene -Provides group verbal and written instruction about how sleep can affect your health.  Define sleep hygiene, discuss sleep cycles and impact of sleep habits. Review good sleep hygiene tips.     Education: Stress and Anxiety: - Provides group verbal and written instruction about the health risks of elevated stress and causes of high stress.  Discuss the correlation between heart/lung disease and anxiety and treatment options. Review healthy ways to manage with stress and anxiety.    Initial Review & Psychosocial Screening:   Initial Psych Review & Screening - 04/12/20 1417      Initial Review   Current issues with History of Depression;Current Stress Concerns;Current Sleep Concerns    Source of Stress Concerns Chronic Illness;Retirement/disability;Family    Comments Mother has dementia and shares caregiving responsibility with sister (1 month at a time),  Been on disability since 2018 with first MI, Rabbit is eating garden, Does not sleep well at least twice a week only a few hours      Murray? No   Sister helps with mother, they lean on him for support, very independent   Concerns No support system      Barriers   Psychosocial barriers to participate in program The patient should benefit from training in stress management and relaxation.;Psychosocial barriers identified (see note)      Screening Interventions   Interventions Encouraged to exercise;To provide support and resources with identified psychosocial needs;Provide feedback about the scores to participant    Expected Outcomes Short Term goal: Utilizing psychosocial counselor, staff and physician to assist with identification of specific Stressors or current issues  interfering with healing process. Setting desired goal for each stressor or current issue identified.;Long Term Goal: Stressors or current issues are controlled or eliminated.;Short Term goal: Identification and review with participant of any Quality of Life or Depression concerns found by scoring the questionnaire.;Long Term goal: The participant improves quality of Life and PHQ9 Scores as seen by post scores and/or verbalization of changes           Quality of Life Scores:   Quality of Life - 04/13/20 1212      Quality of Life   Select Quality of Life      Quality of Life Scores   Health/Function Pre 17.5 %    Socioeconomic Pre 19.75 %    Psych/Spiritual Pre 19.5 %    Family Pre 14.5 %    GLOBAL Pre 18.11 %          Scores of 19 and below usually  indicate a poorer quality of life in these areas.  A difference of  2-3 points is a clinically meaningful difference.  A difference of 2-3 points in the total score of the Quality of Life Index has been associated with significant improvement in overall quality of life, self-image, physical symptoms, and general health in studies assessing change in quality of life.  PHQ-9: Recent Review Flowsheet Data    Depression screen [facility] 2/9 04/13/2020   Decreased Interest 2   Down, Depressed, Hopeless 0   PHQ - 2 Score 2   Altered sleeping 3   Tired, decreased energy 2   Change in appetite 2   Feeling bad or failure about yourself  0   Trouble concentrating 0   Moving slowly or fidgety/restless 0   Suicidal thoughts 0   PHQ-9 Score 9   Difficult doing work/chores Somewhat difficult     Interpretation of Total Score  Total Score Depression Severity:  1-4 = Minimal depression, 5-9 = Mild depression, 10-14 = Moderate depression, 15-19 = Moderately severe depression, 20-27 = Severe depression   Psychosocial Evaluation and Intervention:  Psychosocial Evaluation - 04/12/20 1428      Psychosocial Evaluation & Interventions   Interventions Stress management education;Encouraged to exercise with the program and follow exercise prescription    Comments Mr. Stager is coming into rehab after another stent placement.  He had his first heart event back in 2018 and has been on disability since and then retirement.  He has a history of depression following his divorce in 1993, but recently has been fine.  He is trying to quit smoking and currently on Wellbutrin for it.  He has lost his desire to smoke, but still working on the habit part.  He is a little leary of exercise as his father died during a stress test at [facility] with nurses looking on.  He does want to get stronger and sleep better.  He has noticed that he sleeps best on days that he is active and hopes this will help more. He is very  indpendent and does not lean on anyone, but rather they all lean on him.  His mother has dementia and he shares caregiving with his sister.  Last month, she started have seziures and they still have not found the cause.   Exercise will be a good stress relief for him as well.    Expected Outcomes Short: Exercise for stress relief Long: Build confidence for exercise    Continue Psychosocial Services  Follow up required by staff  Psychosocial Re-Evaluation:  Psychosocial Re-Evaluation    Row Name 05/09/20 1036 05/09/20 1047 06/17/20 1008         Psychosocial Re-Evaluation   Current issues with Current Sleep Concerns Current Sleep Concerns;Current Stress Concerns Current Sleep Concerns;Current Stress Concerns     Comments -- Daryl says he has no new stress concerns.  He doesnt sleep well - wakes up early but isnt tired during the day Sleeping is hard and he will usually toss and turn at night and has sleep maintenance insomnia. Discussed sleep hygiene. He wakes up early and waits for the sun to come up. Last week his friend died, uncle committed suicide and killed his wife - pt reports having family for support. HE is now preparing for his friends kids to come over which is also stressful. 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 Stressed also because he is getting charged for his visits becuase they never sent it to [employer] - he will call again next week.     Expected Outcomes -- Short: continue to attend to help manage stress Long:  maintain positive attitude Short: continue to attend to help manage stress Long: maintain positive attitude     Interventions -- -- Stress management education;Relaxation education;Encouraged to attend Cardiac Rehabilitation for the exercise     Continue Psychosocial Services  -- -- Follow up required by staff     Comments -- -- Mother has dementia and shares caregiving responsibility with sister (1 month at a time),  Been on disability since  2018 with first MI, Rabbit is eating garden, Does not sleep well at least twice a week only a few hours. Last week his friend died, uncle committed suicide and killed his wife - pt reports having family for support. HE is now preparing for his friends kids to come over which is also stressful. 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 Stressed also because he is getting charged for his visits becuase they never sent it to [employer] - he will call again next week.Last week his friend died, uncle committed suicide and killed his wife - pt reports having family for support. HE is now preparing for his friends kids to come over which is also stressful. 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 Stressed also because he is getting charged for his visits becuase they never sent it to [employer] - he will call again next week.       Initial Review   Source of Stress Concerns -- -- Chronic Illness;Retirement/disability;Family;Financial            Psychosocial Discharge (Final Psychosocial Re-Evaluation):  Psychosocial Re-Evaluation - 06/17/20 1008      Psychosocial Re-Evaluation   Current issues with Current Sleep Concerns;Current Stress Concerns    Comments Sleeping is hard and he will usually toss and turn at night and has sleep maintenance insomnia. Discussed sleep hygiene. He wakes up early and waits for the sun to come up. Last week his friend died, uncle committed suicide and killed his wife - pt reports having family for support. HE is now preparing for his friends kids to come over which is also stressful. 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 Stressed also because he is getting charged for his visits becuase they never sent it to [employer] - he will call again next week.    Expected Outcomes Short: continue to attend to help manage stress Long: maintain positive attitude  Interventions Stress management education;Relaxation education;Encouraged to  attend Cardiac Rehabilitation for the exercise    Continue Psychosocial Services  Follow up required by staff    Comments Mother has dementia and shares caregiving responsibility with sister (1 month at a time),  Been on disability since 2018 with first MI, Rabbit is eating garden, Does not sleep well at least twice a week only a few hours. Last week his friend died, uncle committed suicide and killed his wife - pt reports having family for support. HE is now preparing for his friends kids to come over which is also stressful. 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 Stressed also because he is getting charged for his visits becuase they never sent it to [employer] - he will call again next week.Last week his friend died, uncle committed suicide and killed his wife - pt reports having family for support. HE is now preparing for his friends kids to come over which is also stressful. 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 Stressed also because he is getting charged for his visits becuase they never sent it to [employer] - he will call again next week.      Initial Review   Source of Stress Concerns Chronic Illness;Retirement/disability;Family;Financial           Vocational Rehabilitation: Provide vocational rehab assistance to qualifying candidates.   Vocational Rehab Evaluation & Intervention:  Vocational Rehab - 04/12/20 1417      Initial Vocational Rehab Evaluation & Intervention   Assessment shows need for Vocational Rehabilitation No           Education: Education Goals: Education classes will be provided on a variety of topics geared toward better understanding of heart health and risk factor modification. Participant will state understanding/return demonstration of topics presented as noted by education test scores.  Learning Barriers/Preferences:  Learning Barriers/Preferences - 04/12/20 1417      Learning Barriers/Preferences   Learning Barriers  Sight;Exercise Concerns   glasses for reading and outside, fearful of treadmill as father died during stress test   Learning Preferences Skilled Demonstration           General Cardiac Education Topics:  AED/CPR: - Group verbal and written instruction with the use of models to demonstrate the basic use of the AED with the basic ABC's of resuscitation.   Anatomy & Physiology of the Heart: - Group verbal and written instruction and models provide basic cardiac anatomy and physiology, with the coronary electrical and arterial systems. Review of Valvular disease and Heart Failure   Cardiac Procedures: - Group verbal and written instruction to review commonly prescribed medications for heart disease. Reviews the medication, class of the drug, and side effects. Includes the steps to properly store meds and maintain the prescription regimen. (beta blockers and nitrates)   Cardiac Rehab from 06/15/2020 in [facility] Cardiac and Pulmonary Rehab  Date 06/08/20  Educator SB  Instruction Review Code 1- Verbalizes Understanding      Cardiac Medications I: - Group verbal and written instruction to review commonly prescribed medications for heart disease. Reviews the medication, class of the drug, and side effects. Includes the steps to properly store meds and maintain the prescription regimen.   Cardiac Rehab from 06/15/2020 in [facility] Cardiac and Pulmonary Rehab  Date 05/11/20  Educator SB  Instruction Review Code 1- Verbalizes Understanding      Cardiac Medications II: -Group verbal and written instruction to review commonly prescribed medications for heart disease.  Reviews the medication, class of the drug, and side effects. (all other drug classes)   Cardiac Rehab from 06/15/2020 in [facility] Cardiac and Pulmonary Rehab  Date 04/13/20  Instruction Review Code 3- Needs Reinforcement  [need identified]       Go Sex-Intimacy & Heart Disease, Get SMART - Goal Setting: - Group verbal and written  instruction through game format to discuss heart disease and the return to sexual intimacy. Provides group verbal and written material to discuss and apply goal setting through the application of the S.M.A.R.T. Method.   Cardiac Rehab from 06/15/2020 in [facility] Cardiac and Pulmonary Rehab  Date 06/08/20  Educator SB  Instruction Review Code 1- Verbalizes Understanding      Other Matters of the Heart: - Provides group verbal, written materials and models to describe Stable Angina and Peripheral Artery. Includes description of the disease process and treatment options available to the cardiac patient.   Infection Prevention: - Provides verbal and written material to individual with discussion of infection control including proper hand washing and proper equipment cleaning during exercise session.   Cardiac Rehab from 06/15/2020 in [facility] Cardiac and Pulmonary Rehab  Date 04/13/20  Educator [facility]  Instruction Review Code 1- Verbalizes Understanding      Falls Prevention: - Provides verbal and written material to individual with discussion of falls prevention and safety.   Cardiac Rehab from 06/15/2020 in [facility] Cardiac and Pulmonary Rehab  Date 04/13/20  Educator [facility]  Instruction Review Code 1- Verbalizes Understanding      Other: -Provides group and verbal instruction on various topics (see comments)   Knowledge Questionnaire Score:  Knowledge Questionnaire Score - 04/13/20 1213      Knowledge Questionnaire Score   Pre Score 22/26 Education Focus: Depression, heart failure, exercise           Core Components/Risk Factors/Patient Goals at Admission:  Personal Goals and Risk Factors at Admission - 04/13/20 1214      Core Components/Risk Factors/Patient Goals on Admission    Weight Management Yes;Weight Maintenance    Intervention Weight Management: Develop a combined nutrition and exercise program designed to reach desired caloric intake, while maintaining appropriate intake of nutrient  and fiber, sodium and fats, and appropriate energy expenditure required for the weight goal.;Weight Management: Provide education and appropriate resources to help participant work on and attain dietary goals.    Admit Weight 157 lb 11.2 oz (71.5 kg)    Goal Weight: Short Term 158 lb (71.7 kg)    Goal Weight: Long Term 158 lb (71.7 kg)    Expected Outcomes Short Term: Continue to assess and modify interventions until short term weight is achieved;Long Term: Adherence to nutrition and physical activity/exercise program aimed toward attainment of established weight goal;Weight Maintenance: Understanding of the daily nutrition guidelines, which includes 25-35% calories from fat, 7% or less cal from saturated fats, less than 259m cholesterol, less than 1.5gm of sodium, & 5 or more servings of fruits and vegetables daily    Tobacco Cessation Yes    Number of packs per day 0.5    Intervention Assist the participant in steps to quit. Provide individualized education and counseling about committing to Tobacco Cessation, relapse prevention, and pharmacological support that can be provided by physician.;OAdvice worker assist with locating and accessing local/national Quit Smoking programs, and support quit date choice.    Expected Outcomes Short Term: Will demonstrate readiness to quit, by selecting a quit date.;Short Term: Will quit all tobacco product use, adhering to  prevention of relapse plan.;Long Term: Complete abstinence from all tobacco products for at least 12 months from quit date.    Lipids Yes    Intervention Provide education and support for participant on nutrition & aerobic/resistive exercise along with prescribed medications to achieve LDL <34m, HDL >435m    Expected Outcomes Short Term: Participant states understanding of desired cholesterol values and is compliant with medications prescribed. Participant is following exercise prescription and nutrition guidelines.;Long Term:  Cholesterol controlled with medications as prescribed, with individualized exercise RX and with personalized nutrition plan. Value goals: LDL < 7050mHDL > 40 mg.           Education:Diabetes - Individual verbal and written instruction to review signs/symptoms of diabetes, desired ranges of glucose level fasting, after meals and with exercise. Acknowledge that pre and post exercise glucose checks will be done for 3 sessions at entry of program.   Education: Know Your Numbers and Risk Factors: -Group verbal and written instruction about important numbers in your health.  Discussion of what are risk factors and how they play a role in the disease process.  Review of Cholesterol, Blood Pressure, Diabetes, and BMI and the role they play in your overall health.   [facility]  Date 04/13/20  Instruction Review Code 3- Needs Reinforcement  [need identified]      Core Components/Risk Factors/Patient Goals Review:   Goals and Risk Factor Review    Row Name 05/09/20 1017 05/09/20 1019 06/17/20 1003         Core Components/Risk Factors/Patient Goals Review   Personal Goals Review Weight Management/Obesity;Tobacco Cessation;Hypertension -- Tobacco Cessation;Hypertension     Review Tro[place] taking all meds except the "quit smoking" one.  He is down to one cigarette per day with morning coffee. He says he would like to lose weight as he has gained since his heart attack.  He is trying to eat heart healthy. Quit smoking in July - on medication to help with this and says while on it he is doing well, but next week he will be out of them and doesn't know how he will do. Does not have a way to check BP at home.  BP has been around 130s over 70s at rehab, taking BP medication as directed.     Expected Outcomes -- Short:  continue to take meds as directed Long:  manage risk factors Short:  continue to take meds as directed Long:  manage risk factors             Core Components/Risk Factors/Patient Goals at Discharge (Final Review):   Goals and Risk Factor Review - 06/17/20 1003      Core Components/Risk Factors/Patient Goals Review   Personal Goals Review Tobacco Cessation;Hypertension    Review Quit smoking in July - on medication to help with this and says while on it he is doing well, but next week he will be out of them and doesn't know how he will do. Does not have a way to check BP at home.  BP has been around 130s over 70s at rehab, taking BP medication as directed.    Expected Outcomes Short:  continue to take meds as directed Long:  manage risk factors           ITP Comments:  ITP Comments    Row Name 04/12/20 1457 04/13/20 1201 04/13/20 1228 04/20/20 1015 04/27/20 1115   ITP Comments Completed virtual orientation today.  EP evaluation is scheduled for Wed 6/30 at 11am.  Documentation for diagnosis can be found in [facility] encounter 03/07/20. Completed 6MWT and gym orientation. Initial ITP created and sent for review to Dr. Emily Filbert, Medical Director. Yamir is a current tobacco user. Intervention for tobacco cessation was provided at the initial medical review. He was asked about readiness to quit and reported trying to quit current, has reduced to half a pack a day and using Wellbutrin . Patient was advised and educated about tobacco cessation using combination therapy, tobacco cessation classes, quit line, and quit smoking apps. Patient demonstrated understanding of this material. Staff will continue to provide encouragement and follow up with the patient throughout the program. First full day of exercise!  Patient was oriented to gym and equipment including functions, settings, policies, and procedures.  Patient's individual exercise prescription and treatment plan were reviewed.  All starting workloads were established based on the results of the 6 minute walk test done at initial orientation visit.  The plan for exercise progression was  also introduced and progression will be customized based on patient's performance and goals 30 Day review completed. Medical Director ITP review done, changes made as directed, and signed approval by Medical Director. New to program   Row Name 05/25/20 0757 05/31/20 0859 06/22/20 1658 06/30/20 0832 07/20/20 0717   ITP Comments 30 Day review completed. Medical Director ITP review done, changes made as directed, and signed approval by Medical Director. Completed initial RD evaluation 30 day review completed. ITP sent to Dr. Emily Filbert, Medical Director of Cardiac and Pulmonary Rehab. Continue with ITP unless changes are made by physician. Roddrick has not attended since last review . 30 Day review completed. Medical Director ITP review done, changes made as directed, and signed approval by Medical Director.  Eithen has been absent secondary to his bill. He cannot afford the amount billed. Speaking with him, it sounds like his secondary insurance had not been filed. Once filed he should owe $3 per session. He will not return until this is resolved.          Comments:

## 2020-07-21 ENCOUNTER — Encounter: Payer: Self-pay | Admitting: *Deleted

## 2020-07-21 DIAGNOSIS — Z955 Presence of coronary angioplasty implant and graft: Secondary | ICD-10-CM

## 2020-07-21 NOTE — Progress Notes (Signed)
Cardiac Individual Treatment Plan  Patient Details  Name: [name] MRN: [id number] Date of Birth: 09/13/1961 Referring Provider:     Cardiac Rehab from 04/13/2020 in [facility] Cardiac and Pulmonary Rehab  Referring Provider Kathlyn Sacramento MD      Initial Encounter Date:    Cardiac Rehab from 04/13/2020 in [facility] Cardiac and Pulmonary Rehab  Date 04/13/20      Visit Diagnosis: Status post coronary artery stent placement  Patient's Home Medications on Admission:  Current Outpatient Medications:  .  albuterol (PROVENTIL HFA;VENTOLIN HFA) 108 (90 Base) MCG/ACT inhaler, Inhale 2 puffs into the lungs every 6 (six) hours as needed for wheezing or shortness of breath., Disp: 1 Inhaler, Rfl: 2 .  aspirin 81 MG chewable tablet, Chew 1 tablet (81 mg total) by mouth daily., Disp: 30 tablet, Rfl: 0 .  atorvastatin (LIPITOR) 80 MG tablet, TAKE 1 TABLET BY MOUTH ONCE DAILY AT  6PM, Disp: 90 tablet, Rfl: 0 .  buPROPion (WELLBUTRIN SR) 150 MG 12 hr tablet, Take 1 tablet (150 mg total) once daily for 3 days, then increase to 1 tablet (150 mg total) twice daily thereafter., Disp: 60 tablet, Rfl: 2 .  clopidogrel (PLAVIX) 75 MG tablet, Take 1 tablet by mouth once daily, Disp: 90 tablet, Rfl: 3 .  ezetimibe (ZETIA) 10 MG tablet, Take 1 tablet by mouth once daily, Disp: 90 tablet, Rfl: 0 .  metoprolol succinate (TOPROL-XL) 25 MG 24 hr tablet, TAKE 1 TABLET BY MOUTH ONCE DAILY*TAKE WITH OR IMMEDIATELY FOLLOWING A MEAL*, Disp: 90 tablet, Rfl: 0 .  nitroGLYCERIN (NITROSTAT) 0.4 MG SL tablet, DISSOLVE ONE TABLET UNDER THE TONGUE EVERY FIVE MINUTES AS NEEDED FOR CHEST PAIN, Disp: 25 tablet, Rfl: 0 .  pantoprazole (PROTONIX) 40 MG tablet, Take 1 tablet (40 mg total) by mouth daily., Disp: 30 tablet, Rfl: 5  Past Medical History: Past Medical History:  Diagnosis Date  . COPD (chronic obstructive pulmonary disease) ([place])   . Coronary artery disease 08/2017   a. 08/2017 Anterolateral STEMI/PCI: LAD mod  prox dzs, D1 99 (PCI/DES), RCA mod dist dzs, EF nl; b. 02/2020 PCI: LM nl, LAD 70 (iFR abnl - 0.86 ->2.5x22 Resolute Onyx DES), D1 10ost in-stent, LCX mild dzs, OM1/3 min irres, RCA 5m EF 55-65%.  . Diastolic dysfunction    a. 09/2017 Echo: EF 55-60%, severe mid antlat HK. Gr1 DD. Nl RV fxn. PASP 240mg.  . [place]  . Hyperlipidemia   . Medical history non-contributory   . [place]   a. Status post aortobifemoral bypass in 2015; b. 07/2019 ABI: R 1.02, L 1.12.  . Tobacco use     Tobacco Use: Social History   Tobacco Use  Smoking Status Current Every Day Smoker  . Packs/day: 1.00  . Years: 45.00  . Pack years: 45.00  . Types: Cigarettes  Smokeless Tobacco Never Used  Tobacco Comment   8/18 still one cigarette a day with morning coffee    Labs: Recent Review Flowsheet Data    Labs for ITP Cardiac and Pulmonary Rehab Latest Ref Rng & Units 09/13/2017 09/14/2017 12/20/2017 06/23/2019 03/04/2020   Cholestrol 100 - 199 mg/dL 192 171 170 170 134   LDLCALC 0 - 99 mg/dL 100(H) 85 94 80 54   LDLDIRECT 0 - 99 mg/dL - - - 87.4 55   HDL >39 mg/dL 78 68 51 67 65   Trlycerides 0 - 149 mg/dL 68 91 123 113 75   Hemoglobin  A1c 4.8 - 5.6 % 5.5 - - - -       Exercise Target Goals: Exercise Program Goal: Individual exercise prescription set using results from initial 6 min walk test and THRR while considering  patient's activity barriers and safety.   Exercise Prescription Goal: Initial exercise prescription builds to 30-45 minutes a day of aerobic activity, 2-3 days per week.  Home exercise guidelines will be given to patient during program as part of exercise prescription that the participant will acknowledge.   Education: Aerobic Exercise & Resistance Training: - Gives group verbal and written instruction on the various components of exercise. Focuses on aerobic and resistive training programs and the benefits of this training and how to safely  progress through these programs..   Cardiac Rehab from 06/15/2020 in [facility] Cardiac and Pulmonary Rehab  Date 06/08/20  Educator [facility]  Instruction Review Code 1- Verbalizes Understanding      Education: Exercise & Equipment Safety: - Individual verbal instruction and demonstration of equipment use and safety with use of the equipment.   Cardiac Rehab from 06/15/2020 in [facility] Cardiac and Pulmonary Rehab  Date 04/13/20  Educator [facility]  Instruction Review Code 1- Verbalizes Understanding      Education: Exercise Physiology & General Exercise Guidelines: - Group verbal and written instruction with models to review the exercise physiology of the cardiovascular system and associated critical values. Provides general exercise guidelines with specific guidelines to those with heart or lung disease.    Cardiac Rehab from 06/15/2020 in [facility] Cardiac and Pulmonary Rehab  Date 04/13/20  Instruction Review Code 3- Needs Reinforcement  [need identified]      Education: Flexibility, Balance, Mind/Body Relaxation: Provides group verbal/written instruction on the benefits of flexibility and balance training, including mind/body exercise modes such as yoga, pilates and tai chi.  Demonstration and skill practice provided.   Cardiac Rehab from 06/15/2020 in [facility] Cardiac and Pulmonary Rehab  Date 06/15/20  Educator as  Instruction Review Code 1- Verbalizes Understanding      Activity Barriers & Risk Stratification:  Activity Barriers & Cardiac Risk Stratification - 04/13/20 1209      Activity Barriers & Cardiac Risk Stratification   Activity Barriers Deconditioning;Muscular Weakness;Shortness of Breath;Balance Concerns;Joint Problems;Other (comment)    Comments blood clot behind right knee, numbness in feet    Cardiac Risk Stratification High           6 Minute Walk:  6 Minute Walk    Row Name 04/13/20 1201         6 Minute Walk   Phase Initial     Distance 740 feet     Walk Time 6 minutes     # of  Rest Breaks 0     MPH 1.4     METS 2.8     RPE 11     Perceived Dyspnea  3     VO2 Peak 9.79     Symptoms Yes (comment)     Comments mask difficult to breathe in, SOB, leg/claudication pain 8/10     Resting HR 78 bpm     Resting BP 116/64     Resting Oxygen Saturation  97 %     Exercise Oxygen Saturation  during 6 min walk 96 %     Max Ex. HR 94 bpm     Max Ex. BP 142/74     2 Minute Post BP 138/80            Oxygen Initial Assessment:  Oxygen Re-Evaluation:  Oxygen Re-Evaluation    Row Name 05/09/20 1004             Program Oxygen Prescription   Program Oxygen Prescription None         Home Oxygen   Home Oxygen Device None       Sleep Oxygen Prescription None       Home Exercise Oxygen Prescription None       Home Resting Oxygen Prescription None       Compliance with Home Oxygen Use Yes         Goals/Expected Outcomes   Short Term Goals To learn and understand importance of monitoring SPO2 with pulse oximeter and demonstrate accurate use of the pulse oximeter.;To learn and understand importance of maintaining oxygen saturations>88%;To learn and demonstrate proper pursed lip breathing techniques or other breathing techniques.;To learn and demonstrate proper use of respiratory medications       Long  Term Goals Maintenance of O2 saturations>88%;Compliance with respiratory medication;Demonstrates proper use of MDI's;Exhibits proper breathing techniques, such as pursed lip breathing or other method taught during program session;Verbalizes importance of monitoring SPO2 with pulse oximeter and return demonstration       Comments Spoke to patient about COPD Action Plan. Reviewed and talked with the patient about the different levels of COPD severity that they should review daily and the steps they can take to manage their COPD. Went over pertinent actions for the patient can take when they feel like they are having a COPD exacerbation and the necessary treatment if needed. If  patient has any changes with their breathing or feel like they are in a different zone of severity to inform staff for re-evaluation. Patient verbalizes understanding. Copy given to patient.       Goals/Expected Outcomes Short: Follow COPD action plan. Long: Report any changes and severity of COPD.              Oxygen Discharge (Final Oxygen Re-Evaluation):  Oxygen Re-Evaluation - 05/09/20 1004      Program Oxygen Prescription   Program Oxygen Prescription None      Home Oxygen   Home Oxygen Device None    Sleep Oxygen Prescription None    Home Exercise Oxygen Prescription None    Home Resting Oxygen Prescription None    Compliance with Home Oxygen Use Yes      Goals/Expected Outcomes   Short Term Goals To learn and understand importance of monitoring SPO2 with pulse oximeter and demonstrate accurate use of the pulse oximeter.;To learn and understand importance of maintaining oxygen saturations>88%;To learn and demonstrate proper pursed lip breathing techniques or other breathing techniques.;To learn and demonstrate proper use of respiratory medications    Long  Term Goals Maintenance of O2 saturations>88%;Compliance with respiratory medication;Demonstrates proper use of MDI's;Exhibits proper breathing techniques, such as pursed lip breathing or other method taught during program session;Verbalizes importance of monitoring SPO2 with pulse oximeter and return demonstration    Comments Spoke to patient about COPD Action Plan. Reviewed and talked with the patient about the different levels of COPD severity that they should review daily and the steps they can take to manage their COPD. Went over pertinent actions for the patient can take when they feel like they are having a COPD exacerbation and the necessary treatment if needed. If patient has any changes with their breathing or feel like they are in a different zone of severity to inform staff for re-evaluation. Patient verbalizes  understanding. Copy given  to patient.    Goals/Expected Outcomes Short: Follow COPD action plan. Long: Report any changes and severity of COPD.           Initial Exercise Prescription:  Initial Exercise Prescription - 04/13/20 1200      Date of Initial Exercise RX and Referring Provider   Date 04/13/20    Referring Provider Kathlyn Sacramento MD      Recumbant Bike   Level 1    RPM 50    Watts 18    Minutes 15    METs 2.8      NuStep   Level 1    SPM 80    Minutes 15    METs 2.8      Arm Ergometer   Level 1    Watts 34    RPM 25    Minutes 15    METs 2.8      Prescription Details   Frequency (times per week) 3    Duration Progress to 30 minutes of continuous aerobic without signs/symptoms of physical distress      Intensity   THRR 40-80% of Max Heartrate 112-146    Ratings of Perceived Exertion 11-13    Perceived Dyspnea 0-4      Progression   Progression Continue to progress workloads to maintain intensity without signs/symptoms of physical distress.      Resistance Training   Training Prescription Yes    Weight 3 lb    Reps 10-15           Perform Capillary Blood Glucose checks as needed.  Exercise Prescription Changes:  Exercise Prescription Changes    Row Name 04/13/20 1200 05/03/20 1300 05/09/20 1000 05/18/20 1700 05/31/20 1200     Response to Exercise   Blood Pressure (Admit) 116/64 122/66 -- 144/90 142/70   Blood Pressure (Exercise) 142/74 160/74 -- 132/64 164/82   Blood Pressure (Exit) 138/80 132/80 -- 118/76 122/80   Heart Rate (Admit) 78 bpm 62 bpm -- 86 bpm 94 bpm   Heart Rate (Exercise) 94 bpm 110 bpm -- 107 bpm 113 bpm   Heart Rate (Exit) -- 88 bpm -- 83 bpm 89 bpm   Oxygen Saturation (Admit) 97 % -- -- -- --   Oxygen Saturation (Exercise) 96 % -- -- -- --   Rating of Perceived Exertion (Exercise) 11 12 -- 13 13   Perceived Dyspnea (Exercise) 3 -- -- -- --   Symptoms SOB, mask harder to breathe, leg pain 8/10 none -- none none    Comments walk test results -- -- -- --   Duration -- Continue with 30 min of aerobic exercise without signs/symptoms of physical distress. -- Continue with 30 min of aerobic exercise without signs/symptoms of physical distress. Continue with 30 min of aerobic exercise without signs/symptoms of physical distress.   Intensity -- THRR unchanged -- -- --     Progression   Progression -- Continue to progress workloads to maintain intensity without signs/symptoms of physical distress. -- Continue to progress workloads to maintain intensity without signs/symptoms of physical distress. Continue to progress workloads to maintain intensity without signs/symptoms of physical distress.   Average METs -- 2.7 -- 2.77 2.78     Resistance Training   Training Prescription -- Yes -- Yes Yes   Weight -- 4 lb -- 4 lb 4 lb   Reps -- 10-15 -- 10-15 10-15     Interval Training   Interval Training -- No -- No No  Recumbant Bike   Level -- 1 -- 7 7   RPM -- 50 -- -- 50   Watts -- -- -- 25 25   Minutes -- 15 -- 15 15   METs -- -- -- 3.3 3.06     NuStep   Level -- 2 -- 3 --   SPM -- 80 -- -- --   Minutes -- 15 -- 15 --   METs -- 1.8 -- 1.8 --     Arm Ergometer   Level -- -- -- 1 1   Minutes -- -- -- 15 15   METs -- -- -- 2.1 2.5     Home Exercise Plan   Plans to continue exercise at -- -- Home (comment) Home (comment) Home (comment)   Frequency -- -- Add 1 additional day to program exercise sessions. Add 1 additional day to program exercise sessions. Add 1 additional day to program exercise sessions.   Initial Home Exercises Provided -- -- 05/09/20 05/09/20 05/09/20   Row Name 06/14/20 1100             Response to Exercise   Blood Pressure (Admit) 130/82       Blood Pressure (Exercise) 152/72       Blood Pressure (Exit) 108/76       Heart Rate (Admit) 98 bpm       Heart Rate (Exercise) 136 bpm       Heart Rate (Exit) 75 bpm       Rating of Perceived Exertion (Exercise) 14       Symptoms  none       Duration Continue with 30 min of aerobic exercise without signs/symptoms of physical distress.       Intensity THRR unchanged         Progression   Progression Continue to progress workloads to maintain intensity without signs/symptoms of physical distress.       Average METs 3.36         Resistance Training   Training Prescription Yes       Weight 5 lb       Reps 10-15         Interval Training   Interval Training No         Recumbant Bike   Level 8       Watts 41       Minutes 15       METs 3.68         NuStep   Level 5       Minutes 15       METs 2.9         Arm Ergometer   Level 4       Minutes 15       METs 3.5         Home Exercise Plan   Plans to continue exercise at Home (comment)       Frequency Add 1 additional day to program exercise sessions.       Initial Home Exercises Provided 05/09/20              Exercise Comments:   Exercise Goals and Review:  Exercise Goals    Row Name 04/13/20 1211             Exercise Goals   Increase Physical Activity Yes       Intervention Provide advice, education, support and counseling about physical activity/exercise needs.;Develop an individualized exercise prescription for aerobic and resistive training  based on initial evaluation findings, risk stratification, comorbidities and participant's personal goals.       Expected Outcomes Short Term: Attend rehab on a regular basis to increase amount of physical activity.;Long Term: Add in home exercise to make exercise part of routine and to increase amount of physical activity.;Long Term: Exercising regularly at least 3-5 days a week.       Increase Strength and Stamina Yes       Intervention Provide advice, education, support and counseling about physical activity/exercise needs.;Develop an individualized exercise prescription for aerobic and resistive training based on initial evaluation findings, risk stratification, comorbidities and participant's  personal goals.       Expected Outcomes Short Term: Increase workloads from initial exercise prescription for resistance, speed, and METs.;Short Term: Perform resistance training exercises routinely during rehab and add in resistance training at home;Long Term: Improve cardiorespiratory fitness, muscular endurance and strength as measured by increased METs and functional capacity (6MWT)       Able to understand and use rate of perceived exertion (RPE) scale Yes       Intervention Provide education and explanation on how to use RPE scale       Expected Outcomes Short Term: Able to use RPE daily in rehab to express subjective intensity level;Long Term:  Able to use RPE to guide intensity level when exercising independently       Able to understand and use Dyspnea scale Yes       Intervention Provide education and explanation on how to use Dyspnea scale       Expected Outcomes Short Term: Able to use Dyspnea scale daily in rehab to express subjective sense of shortness of breath during exertion;Long Term: Able to use Dyspnea scale to guide intensity level when exercising independently       Knowledge and understanding of Target Heart Rate Range (THRR) Yes       Intervention Provide education and explanation of THRR including how the numbers were predicted and where they are located for reference       Expected Outcomes Short Term: Able to state/look up THRR;Short Term: Able to use daily as guideline for intensity in rehab;Long Term: Able to use THRR to govern intensity when exercising independently       Able to check pulse independently Yes       Intervention Provide education and demonstration on how to check pulse in carotid and radial arteries.;Review the importance of being able to check your own pulse for safety during independent exercise       Expected Outcomes Short Term: Able to explain why pulse checking is important during independent exercise;Long Term: Able to check pulse independently and  accurately       Understanding of Exercise Prescription Yes       Intervention Provide education, explanation, and written materials on patient's individual exercise prescription       Expected Outcomes Short Term: Able to explain program exercise prescription;Long Term: Able to explain home exercise prescription to exercise independently              Exercise Goals Re-Evaluation :  Exercise Goals Re-Evaluation    Row Name 04/20/20 1017 05/03/20 1358 05/09/20 1022 05/18/20 1825 05/31/20 1255     Exercise Goal Re-Evaluation   Exercise Goals Review Increase Physical Activity;Able to understand and use rate of perceived exertion (RPE) scale;Knowledge and understanding of Target Heart Rate Range (THRR);Understanding of Exercise Prescription;Increase Strength and Stamina;Able to check pulse independently Increase Physical Activity;Increase  Strength and Stamina;Understanding of Exercise Prescription Increase Physical Activity;Increase Strength and Stamina;Understanding of Exercise Prescription Increase Physical Activity;Increase Strength and Stamina;Understanding of Exercise Prescription Increase Physical Activity;Increase Strength and Stamina;Understanding of Exercise Prescription   Comments Reviewed RPE and dyspnea scales, THR and program prescription with pt today.  Pt voiced understanding and was given a copy of goals to take home. Robbert is tolerating exercise well so far.  He does not want to try a TM.  Staff will monitor progress. Reviewed home exercise with pt today.  Pt plans to ride a bike for exercise.  Reviewed THR, pulse, RPE, sign and symptoms, pulse oximetery and when to call 911 or MD.  Also discussed weather considerations and indoor options.  Pt voiced understanding. Jeramy is doing well in rehab. He has already increased his hand weights to 4 lbs and on Level 3 on the NuStep. Staff will continue to monitor. Rishan has progressed to level 4 on NS and level 7 on RB.  He works at lower end of THR  range, but still is only on seated machines.  Staff will monitor progress.   Expected Outcomes Short: Use RPE daily to regulate intensity. Long: Follow program prescription in THR. Short: continue to attend consistently Long: increase overall stamina Short:  add exercise outside of program sessions  Long: increase stamina and maintain exercise on his own Short: Continue attending rehab consistently Long: Increase MET level/ increase strength/ stamina Short:  continue to increase workloads as tolerated Long: improve stamina and MET level   Row Name 06/14/20 1159 06/17/20 1016           Exercise Goal Re-Evaluation   Exercise Goals Review Increase Physical Activity;Increase Strength and Stamina;Understanding of Exercise Prescription Increase Physical Activity;Increase Strength and Stamina;Understanding of Exercise Prescription      Comments Randy continues to do well in rehab.  He is now up to level 8 on the bike and level 4 on the Arm Crank.  We will continue to monitor his progress. Janos continues to do well in rehab.  He is active outside of rehab and does not do any structured exercise. Pt has many stressors right now and discussed how exercise could help - especially things such as yoga.      Expected Outcomes Short: Increase hand weights Long: Continue to improve stamina. Short: exercise outside of rehab Long: Continue to improve stamina.             Discharge Exercise Prescription (Final Exercise Prescription Changes):  Exercise Prescription Changes - 06/14/20 1100      Response to Exercise   Blood Pressure (Admit) 130/82    Blood Pressure (Exercise) 152/72    Blood Pressure (Exit) 108/76    Heart Rate (Admit) 98 bpm    Heart Rate (Exercise) 136 bpm    Heart Rate (Exit) 75 bpm    Rating of Perceived Exertion (Exercise) 14    Symptoms none    Duration Continue with 30 min of aerobic exercise without signs/symptoms of physical distress.    Intensity THRR unchanged      Progression    Progression Continue to progress workloads to maintain intensity without signs/symptoms of physical distress.    Average METs 3.36      Resistance Training   Training Prescription Yes    Weight 5 lb    Reps 10-15      Interval Training   Interval Training No      Recumbant Bike   Level 8    Watts  41    Minutes 15    METs 3.68      NuStep   Level 5    Minutes 15    METs 2.9      Arm Ergometer   Level 4    Minutes 15    METs 3.5      Home Exercise Plan   Plans to continue exercise at Home (comment)    Frequency Add 1 additional day to program exercise sessions.    Initial Home Exercises Provided 05/09/20           Nutrition:  Target Goals: Understanding of nutrition guidelines, daily intake of sodium <1560m, cholesterol <2068m calories 30% from fat and 7% or less from saturated fats, daily to have 5 or more servings of fruits and vegetables.  Education: Controlling Sodium/Reading Food Labels -Group verbal and written material supporting the discussion of sodium use in heart healthy nutrition. Review and explanation with models, verbal and written materials for utilization of the food label.   Education: General Nutrition Guidelines/Fats and Fiber: -Group instruction provided by verbal, written material, models and posters to present the general guidelines for heart healthy nutrition. Gives an explanation and review of dietary fats and fiber.   Biometrics:  Pre Biometrics - 04/13/20 1212      Pre Biometrics   Height 5' 6.75" (1.695 m)    Weight 157 lb 11.2 oz (71.5 kg)    BMI (Calculated) 24.9    Single Leg Stand 30 seconds            Nutrition Therapy Plan and Nutrition Goals:  Nutrition Therapy & Goals - 05/31/20 0832      Nutrition Therapy   Diet Heart healthy, low Na, pulmonary MNT    Drug/Food Interactions Statins/Certain Fruits    Protein (specify units) 80-85g    Fiber 30 grams    Whole Grain Foods 3 servings    Saturated Fats 12 max. grams     Fruits and Vegetables 5 servings/day    Sodium 1.5 grams      Personal Nutrition Goals   Nutrition Goal ST: add heart healthy fats, experiment with new fruits and vegetables, LT: Weight loss (feeks like he is carrying around a watermelon) - gained 20 pounds - getting much less exercise    Comments Pt primary diagnosis for Cardiac Rehab is s/p coronary artery placement. Pt also presents with CAD, HLD, PAD, STEMI, COPD - emphysema, and GERD. Pt is a current smoker - pt reports smoking 1 pack per day (45 years) and has 2 drinks/week. Current Relevant Medications: lipitor, wellbutrin, protonix. Disbility limits him - cant climb stairs or climb ladders. Eats once a day at night. D: "whatever I can catch, kill, or grow" last week had some jumbalaya, has a lot of venison, does not like sugar (will have one piece of sugar in his coffee in the morning). Honoring hunger. Grows cucumbers, tomatoes, squash, zucchini. Pt reports hating salt and will use very minimal - cut sodium when he was younger due to fathers heart attack. Uses animal fat with venison to make sausage and vegetable oil or butter with everything else. Hasn't eaten at a restaurant in over 10 years. Discussed heart healthy eating and pulmonary MNT. Suggested adding more heart healthy fats and begin replacing the butter, having sausage less often to avoid the large amounts of saturated fat from the animal fat he is adding, and encouraged a variety of fruits and vegetables to make sure he is meeting his needs -  especially given he is only having one meal a day. Encouraged pt not to restrict portions as he is only eating one meal a day and his needs are higher with his lung conditions.      Intervention Plan   Intervention Prescribe, educate and counsel regarding individualized specific dietary modifications aiming towards targeted core components such as weight, hypertension, lipid management, diabetes, heart failure and other comorbidities.;Nutrition  handout(s) given to patient.    Expected Outcomes Short Term Goal: Understand basic principles of dietary content, such as calories, fat, sodium, cholesterol and nutrients.;Short Term Goal: A plan has been developed with personal nutrition goals set during dietitian appointment.;Long Term Goal: Adherence to prescribed nutrition plan.           Nutrition Assessments:  Nutrition Assessments - 04/13/20 1213      MEDFICTS Scores   Pre Score 33           MEDIFICTS Score Key:          ?70 Need to make dietary changes          40-70 Heart Healthy Diet         ? 40 Therapeutic Level Cholesterol Diet  Nutrition Goals Re-Evaluation:   Nutrition Goals Discharge (Final Nutrition Goals Re-Evaluation):   Psychosocial: Target Goals: Acknowledge presence or absence of significant depression and/or stress, maximize coping skills, provide positive support system. Participant is able to verbalize types and ability to use techniques and skills needed for reducing stress and depression.   Education: Depression - Provides group verbal and written instruction on the correlation between heart/lung disease and depressed mood, treatment options, and the stigmas associated with seeking treatment.   Cardiac Rehab from 06/15/2020 in [facility] Cardiac and Pulmonary Rehab  Date 04/13/20  Instruction Review Code 3- Needs Reinforcement  [need identified]      Education: Sleep Hygiene -Provides group verbal and written instruction about how sleep can affect your health.  Define sleep hygiene, discuss sleep cycles and impact of sleep habits. Review good sleep hygiene tips.     Education: Stress and Anxiety: - Provides group verbal and written instruction about the health risks of elevated stress and causes of high stress.  Discuss the correlation between heart/lung disease and anxiety and treatment options. Review healthy ways to manage with stress and anxiety.    Initial Review & Psychosocial Screening:   Initial Psych Review & Screening - 04/12/20 1417      Initial Review   Current issues with History of Depression;Current Stress Concerns;Current Sleep Concerns    Source of Stress Concerns Chronic Illness;Retirement/disability;Family    Comments Mother has dementia and shares caregiving responsibility with sister (1 month at a time),  Been on disability since 2018 with first MI, Rabbit is eating garden, Does not sleep well at least twice a week only a few hours      [place]ray? No   Sister helps with mother, they lean on him for support, very independent   Concerns No support system      Barriers   Psychosocial barriers to participate in program The patient should benefit from training in stress management and relaxation.;Psychosocial barriers identified (see note)      Screening Interventions   Interventions Encouraged to exercise;To provide support and resources with identified psychosocial needs;Provide feedback about the scores to participant    Expected Outcomes Short Term goal: Utilizing psychosocial counselor, staff and physician to assist with identification of specific Stressors or current issues  interfering with healing process. Setting desired goal for each stressor or current issue identified.;Long Term Goal: Stressors or current issues are controlled or eliminated.;Short Term goal: Identification and review with participant of any Quality of Life or Depression concerns found by scoring the questionnaire.;Long Term goal: The participant improves quality of Life and PHQ9 Scores as seen by post scores and/or verbalization of changes           Quality of Life Scores:   Quality of Life - 04/13/20 1212      Quality of Life   Select Quality of Life      Quality of Life Scores   Health/Function Pre 17.5 %    Socioeconomic Pre 19.75 %    Psych/Spiritual Pre 19.5 %    Family Pre 14.5 %    GLOBAL Pre 18.11 %          Scores of 19 and below usually  indicate a poorer quality of life in these areas.  A difference of  2-3 points is a clinically meaningful difference.  A difference of 2-3 points in the total score of the Quality of Life Index has been associated with significant improvement in overall quality of life, self-image, physical symptoms, and general health in studies assessing change in quality of life.  PHQ-9: Recent Review Flowsheet Data    Depression screen [facility] 2/9 04/13/2020   Decreased Interest 2   Down, Depressed, Hopeless 0   PHQ - 2 Score 2   Altered sleeping 3   Tired, decreased energy 2   Change in appetite 2   Feeling bad or failure about yourself  0   Trouble concentrating 0   Moving slowly or fidgety/restless 0   Suicidal thoughts 0   PHQ-9 Score 9   Difficult doing work/chores Somewhat difficult     Interpretation of Total Score  Total Score Depression Severity:  1-4 = Minimal depression, 5-9 = Mild depression, 10-14 = Moderate depression, 15-19 = Moderately severe depression, 20-27 = Severe depression   Psychosocial Evaluation and Intervention:  Psychosocial Evaluation - 04/12/20 1428      Psychosocial Evaluation & Interventions   Interventions Stress management education;Encouraged to exercise with the program and follow exercise prescription    Comments Mr. Stager is coming into rehab after another stent placement.  He had his first heart event back in 2018 and has been on disability since and then retirement.  He has a history of depression following his divorce in 1993, but recently has been fine.  He is trying to quit smoking and currently on Wellbutrin for it.  He has lost his desire to smoke, but still working on the habit part.  He is a little leary of exercise as his father died during a stress test at [facility] with nurses looking on.  He does want to get stronger and sleep better.  He has noticed that he sleeps best on days that he is active and hopes this will help more. He is very  indpendent and does not lean on anyone, but rather they all lean on him.  His mother has dementia and he shares caregiving with his sister.  Last month, she started have seziures and they still have not found the cause.   Exercise will be a good stress relief for him as well.    Expected Outcomes Short: Exercise for stress relief Long: Build confidence for exercise    Continue Psychosocial Services  Follow up required by staff  Psychosocial Re-Evaluation:  Psychosocial Re-Evaluation    Row Name 05/09/20 1036 05/09/20 1047 06/17/20 1008         Psychosocial Re-Evaluation   Current issues with Current Sleep Concerns Current Sleep Concerns;Current Stress Concerns Current Sleep Concerns;Current Stress Concerns     Comments -- Daryl says he has no new stress concerns.  He doesnt sleep well - wakes up early but isnt tired during the day Sleeping is hard and he will usually toss and turn at night and has sleep maintenance insomnia. Discussed sleep hygiene. He wakes up early and waits for the sun to come up. Last week his friend died, uncle committed suicide and killed his wife - pt reports having family for support. HE is now preparing for his friends kids to come over which is also stressful. 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 Stressed also because he is getting charged for his visits becuase they never sent it to [employer] - he will call again next week.     Expected Outcomes -- Short: continue to attend to help manage stress Long:  maintain positive attitude Short: continue to attend to help manage stress Long: maintain positive attitude     Interventions -- -- Stress management education;Relaxation education;Encouraged to attend Cardiac Rehabilitation for the exercise     Continue Psychosocial Services  -- -- Follow up required by staff     Comments -- -- Mother has dementia and shares caregiving responsibility with sister (1 month at a time),  Been on disability since  2018 with first MI, Rabbit is eating garden, Does not sleep well at least twice a week only a few hours. Last week his friend died, uncle committed suicide and killed his wife - pt reports having family for support. HE is now preparing for his friends kids to come over which is also stressful. 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 Stressed also because he is getting charged for his visits becuase they never sent it to [employer] - he will call again next week.Last week his friend died, uncle committed suicide and killed his wife - pt reports having family for support. HE is now preparing for his friends kids to come over which is also stressful. 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 Stressed also because he is getting charged for his visits becuase they never sent it to [employer] - he will call again next week.       Initial Review   Source of Stress Concerns -- -- Chronic Illness;Retirement/disability;Family;Financial            Psychosocial Discharge (Final Psychosocial Re-Evaluation):  Psychosocial Re-Evaluation - 06/17/20 1008      Psychosocial Re-Evaluation   Current issues with Current Sleep Concerns;Current Stress Concerns    Comments Sleeping is hard and he will usually toss and turn at night and has sleep maintenance insomnia. Discussed sleep hygiene. He wakes up early and waits for the sun to come up. Last week his friend died, uncle committed suicide and killed his wife - pt reports having family for support. HE is now preparing for his friends kids to come over which is also stressful. 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 Stressed also because he is getting charged for his visits becuase they never sent it to [employer] - he will call again next week.    Expected Outcomes Short: continue to attend to help manage stress Long: maintain positive attitude  Interventions Stress management education;Relaxation education;Encouraged to  attend Cardiac Rehabilitation for the exercise    Continue Psychosocial Services  Follow up required by staff    Comments Mother has dementia and shares caregiving responsibility with sister (1 month at a time),  Been on disability since 2018 with first MI, Rabbit is eating garden, Does not sleep well at least twice a week only a few hours. Last week his friend died, uncle committed suicide and killed his wife - pt reports having family for support. HE is now preparing for his friends kids to come over which is also stressful. 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 Stressed also because he is getting charged for his visits becuase they never sent it to [employer] - he will call again next week.Last week his friend died, uncle committed suicide and killed his wife - pt reports having family for support. HE is now preparing for his friends kids to come over which is also stressful. 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 Stressed also because he is getting charged for his visits becuase they never sent it to [employer] - he will call again next week.      Initial Review   Source of Stress Concerns Chronic Illness;Retirement/disability;Family;Financial           Vocational Rehabilitation: Provide vocational rehab assistance to qualifying candidates.   Vocational Rehab Evaluation & Intervention:  Vocational Rehab - 04/12/20 1417      Initial Vocational Rehab Evaluation & Intervention   Assessment shows need for Vocational Rehabilitation No           Education: Education Goals: Education classes will be provided on a variety of topics geared toward better understanding of heart health and risk factor modification. Participant will state understanding/return demonstration of topics presented as noted by education test scores.  Learning Barriers/Preferences:  Learning Barriers/Preferences - 04/12/20 1417      Learning Barriers/Preferences   Learning Barriers  Sight;Exercise Concerns   glasses for reading and outside, fearful of treadmill as father died during stress test   Learning Preferences Skilled Demonstration           General Cardiac Education Topics:  AED/CPR: - Group verbal and written instruction with the use of models to demonstrate the basic use of the AED with the basic ABC's of resuscitation.   Anatomy & Physiology of the Heart: - Group verbal and written instruction and models provide basic cardiac anatomy and physiology, with the coronary electrical and arterial systems. Review of Valvular disease and Heart Failure   Cardiac Procedures: - Group verbal and written instruction to review commonly prescribed medications for heart disease. Reviews the medication, class of the drug, and side effects. Includes the steps to properly store meds and maintain the prescription regimen. (beta blockers and nitrates)   Cardiac Rehab from 06/15/2020 in [facility] Cardiac and Pulmonary Rehab  Date 06/08/20  Educator SB  Instruction Review Code 1- Verbalizes Understanding      Cardiac Medications I: - Group verbal and written instruction to review commonly prescribed medications for heart disease. Reviews the medication, class of the drug, and side effects. Includes the steps to properly store meds and maintain the prescription regimen.   Cardiac Rehab from 06/15/2020 in [facility] Cardiac and Pulmonary Rehab  Date 05/11/20  Educator SB  Instruction Review Code 1- Verbalizes Understanding      Cardiac Medications II: -Group verbal and written instruction to review commonly prescribed medications for heart disease.  Reviews the medication, class of the drug, and side effects. (all other drug classes)   Cardiac Rehab from 06/15/2020 in [facility] Cardiac and Pulmonary Rehab  Date 04/13/20  Instruction Review Code 3- Needs Reinforcement  [need identified]       Go Sex-Intimacy & Heart Disease, Get SMART - Goal Setting: - Group verbal and written  instruction through game format to discuss heart disease and the return to sexual intimacy. Provides group verbal and written material to discuss and apply goal setting through the application of the S.M.A.R.T. Method.   Cardiac Rehab from 06/15/2020 in [facility] Cardiac and Pulmonary Rehab  Date 06/08/20  Educator SB  Instruction Review Code 1- Verbalizes Understanding      Other Matters of the Heart: - Provides group verbal, written materials and models to describe Stable Angina and Peripheral Artery. Includes description of the disease process and treatment options available to the cardiac patient.   Infection Prevention: - Provides verbal and written material to individual with discussion of infection control including proper hand washing and proper equipment cleaning during exercise session.   Cardiac Rehab from 06/15/2020 in [facility] Cardiac and Pulmonary Rehab  Date 04/13/20  Educator [facility]  Instruction Review Code 1- Verbalizes Understanding      Falls Prevention: - Provides verbal and written material to individual with discussion of falls prevention and safety.   Cardiac Rehab from 06/15/2020 in [facility] Cardiac and Pulmonary Rehab  Date 04/13/20  Educator [facility]  Instruction Review Code 1- Verbalizes Understanding      Other: -Provides group and verbal instruction on various topics (see comments)   Knowledge Questionnaire Score:  Knowledge Questionnaire Score - 04/13/20 1213      Knowledge Questionnaire Score   Pre Score 22/26 Education Focus: Depression, heart failure, exercise           Core Components/Risk Factors/Patient Goals at Admission:  Personal Goals and Risk Factors at Admission - 04/13/20 1214      Core Components/Risk Factors/Patient Goals on Admission    Weight Management Yes;Weight Maintenance    Intervention Weight Management: Develop a combined nutrition and exercise program designed to reach desired caloric intake, while maintaining appropriate intake of nutrient  and fiber, sodium and fats, and appropriate energy expenditure required for the weight goal.;Weight Management: Provide education and appropriate resources to help participant work on and attain dietary goals.    Admit Weight 157 lb 11.2 oz (71.5 kg)    Goal Weight: Short Term 158 lb (71.7 kg)    Goal Weight: Long Term 158 lb (71.7 kg)    Expected Outcomes Short Term: Continue to assess and modify interventions until short term weight is achieved;Long Term: Adherence to nutrition and physical activity/exercise program aimed toward attainment of established weight goal;Weight Maintenance: Understanding of the daily nutrition guidelines, which includes 25-35% calories from fat, 7% or less cal from saturated fats, less than 259m cholesterol, less than 1.5gm of sodium, & 5 or more servings of fruits and vegetables daily    Tobacco Cessation Yes    Number of packs per day 0.5    Intervention Assist the participant in steps to quit. Provide individualized education and counseling about committing to Tobacco Cessation, relapse prevention, and pharmacological support that can be provided by physician.;OAdvice worker assist with locating and accessing local/national Quit Smoking programs, and support quit date choice.    Expected Outcomes Short Term: Will demonstrate readiness to quit, by selecting a quit date.;Short Term: Will quit all tobacco product use, adhering to  prevention of relapse plan.;Long Term: Complete abstinence from all tobacco products for at least 12 months from quit date.    Lipids Yes    Intervention Provide education and support for participant on nutrition & aerobic/resistive exercise along with prescribed medications to achieve LDL <34m, HDL >435m    Expected Outcomes Short Term: Participant states understanding of desired cholesterol values and is compliant with medications prescribed. Participant is following exercise prescription and nutrition guidelines.;Long Term:  Cholesterol controlled with medications as prescribed, with individualized exercise RX and with personalized nutrition plan. Value goals: LDL < 7050mHDL > 40 mg.           Education:Diabetes - Individual verbal and written instruction to review signs/symptoms of diabetes, desired ranges of glucose level fasting, after meals and with exercise. Acknowledge that pre and post exercise glucose checks will be done for 3 sessions at entry of program.   Education: Know Your Numbers and Risk Factors: -Group verbal and written instruction about important numbers in your health.  Discussion of what are risk factors and how they play a role in the disease process.  Review of Cholesterol, Blood Pressure, Diabetes, and BMI and the role they play in your overall health.   [facility]  Date 04/13/20  Instruction Review Code 3- Needs Reinforcement  [need identified]      Core Components/Risk Factors/Patient Goals Review:   Goals and Risk Factor Review    Row Name 05/09/20 1017 05/09/20 1019 06/17/20 1003         Core Components/Risk Factors/Patient Goals Review   Personal Goals Review Weight Management/Obesity;Tobacco Cessation;Hypertension -- Tobacco Cessation;Hypertension     Review [place] taking all meds except the "quit smoking" one.  He is down to one cigarette per day with morning coffee. He says he would like to lose weight as he has gained since his heart attack.  He is trying to eat heart healthy. Quit smoking in July - on medication to help with this and says while on it he is doing well, but next week he will be out of them and doesn't know how he will do. Does not have a way to check BP at home.  BP has been around 130s over 70s at rehab, taking BP medication as directed.     Expected Outcomes -- Short:  continue to take meds as directed Long:  manage risk factors Short:  continue to take meds as directed Long:  manage risk factors             Core Components/Risk Factors/Patient Goals at Discharge (Final Review):   Goals and Risk Factor Review - 06/17/20 1003      Core Components/Risk Factors/Patient Goals Review   Personal Goals Review Tobacco Cessation;Hypertension    Review Quit smoking in July - on medication to help with this and says while on it he is doing well, but next week he will be out of them and doesn't know how he will do. Does not have a way to check BP at home.  BP has been around 130s over 70s at rehab, taking BP medication as directed.    Expected Outcomes Short:  continue to take meds as directed Long:  manage risk factors           ITP Comments:  ITP Comments    Row Name 04/12/20 1457 04/13/20 1201 04/13/20 1228 04/20/20 1015 04/27/20 1115   ITP Comments Completed virtual orientation today.  EP evaluation is scheduled for Wed 6/30 at 11am.  Documentation for diagnosis can be found in [facility] encounter 03/07/20. Completed 6MWT and gym orientation. Initial ITP created and sent for review to Dr. Emily Filbert, Medical Director. Prabhav is a current tobacco user. Intervention for tobacco cessation was provided at the initial medical review. He was asked about readiness to quit and reported trying to quit current, has reduced to half a pack a day and using Wellbutrin . Patient was advised and educated about tobacco cessation using combination therapy, tobacco cessation classes, quit line, and quit smoking apps. Patient demonstrated understanding of this material. Staff will continue to provide encouragement and follow up with the patient throughout the program. First full day of exercise!  Patient was oriented to gym and equipment including functions, settings, policies, and procedures.  Patient's individual exercise prescription and treatment plan were reviewed.  All starting workloads were established based on the results of the 6 minute walk test done at initial orientation visit.  The plan for exercise progression was  also introduced and progression will be customized based on patient's performance and goals 30 Day review completed. Medical Director ITP review done, changes made as directed, and signed approval by Medical Director. New to program   Row Name 05/25/20 0757 05/31/20 0859 06/22/20 1658 06/30/20 0832 07/20/20 0717   ITP Comments 30 Day review completed. Medical Director ITP review done, changes made as directed, and signed approval by Medical Director. Completed initial RD evaluation 30 day review completed. ITP sent to Dr. Emily Filbert, Medical Director of Cardiac and Pulmonary Rehab. Continue with ITP unless changes are made by physician. Roby has not attended since last review . 30 Day review completed. Medical Director ITP review done, changes made as directed, and signed approval by Medical Director.  Marqus has been absent secondary to his bill. He cannot afford the amount billed. Speaking with him, it sounds like his secondary insurance had not been filed. Once filed he should owe $3 per session. He will not return until this is resolved.   [place] Name 07/21/20 1040           ITP Comments Kenechukwu is being discharged from the program due to insurance complications and scheduling conflicts              Comments: Discharge ITP

## 2020-07-21 NOTE — Progress Notes (Signed)
Discharge Progress Report  Patient Details  Name: [name] MRN: [id number] Date of Birth: 04/22/1961 Referring Provider:     Cardiac Rehab from 04/13/2020 in [facility]  Referring Provider Lorine Bears MD       Number of Visits: 37  Reason for Discharge:  Early Exit:  Insurance and Personal  Smoking History:  Social History   Tobacco Use  Smoking Status Current Every Day Smoker  . Packs/day: 1.00  . Years: 45.00  . Pack years: 45.00  . Types: Cigarettes  Smokeless Tobacco Never Used  Tobacco Comment   8/18 still one cigarette a day with morning coffee    Diagnosis:  Status post coronary artery stent placement  ADL UCSD:   Initial Exercise Prescription:  Initial Exercise Prescription - 04/13/20 1200      Date of Initial Exercise RX and Referring Provider   Date 04/13/20    Referring Provider Lorine Bears MD      Recumbant Bike   Level 1    RPM 50    Watts 18    Minutes 15    METs 2.8      NuStep   Level 1    SPM 80    Minutes 15    METs 2.8      Arm Ergometer   Level 1    Watts 34    RPM 25    Minutes 15    METs 2.8      Prescription Details   Frequency (times per week) 3    Duration Progress to 30 minutes of continuous aerobic without signs/symptoms of physical distress      Intensity   THRR 40-80% of Max Heartrate 112-146    Ratings of Perceived Exertion 11-13    Perceived Dyspnea 0-4      Progression   Progression Continue to progress workloads to maintain intensity without signs/symptoms of physical distress.      Resistance Training   Training Prescription Yes    Weight 3 lb    Reps 10-15           Discharge Exercise Prescription (Final Exercise Prescription Changes):  Exercise Prescription Changes - 06/14/20 1100      Response to Exercise   Blood Pressure (Admit) 130/82    Blood Pressure (Exercise) 152/72    Blood Pressure (Exit) 108/76    Heart Rate (Admit) 98 bpm    Heart Rate  (Exercise) 136 bpm    Heart Rate (Exit) 75 bpm    Rating of Perceived Exertion (Exercise) 14    Symptoms none    Duration Continue with 30 min of aerobic exercise without signs/symptoms of physical distress.    Intensity THRR unchanged      Progression   Progression Continue to progress workloads to maintain intensity without signs/symptoms of physical distress.    Average METs 3.36      Resistance Training   Training Prescription Yes    Weight 5 lb    Reps 10-15      Interval Training   Interval Training No      Recumbant Bike   Level 8    Watts 41    Minutes 15    METs 3.68      NuStep   Level 5    Minutes 15    METs 2.9      Arm Ergometer   Level 4    Minutes 15    METs 3.5  Home Exercise Plan   Plans to continue exercise at Home (comment)    Frequency Add 1 additional day to program exercise sessions.    Initial Home Exercises Provided 05/09/20           Functional Capacity:  6 Minute Walk    Row Name 04/13/20 1201         6 Minute Walk   Phase Initial     Distance 740 feet     Walk Time 6 minutes     # of Rest Breaks 0     MPH 1.4     METS 2.8     RPE 11     Perceived Dyspnea  3     VO2 Peak 9.79     Symptoms Yes (comment)     Comments mask difficult to breathe in, SOB, leg/claudication pain 8/10     Resting HR 78 bpm     Resting BP 116/64     Resting Oxygen Saturation  97 %     Exercise Oxygen Saturation  during 6 min walk 96 %     Max Ex. HR 94 bpm     Max Ex. BP 142/74     2 Minute Post BP 138/80            Psychological, QOL, Others - Outcomes: PHQ 2/9: Depression screen PHQ 2/9 04/13/2020  Decreased Interest 2  Down, Depressed, Hopeless 0  PHQ - 2 Score 2  Altered sleeping 3  Tired, decreased energy 2  Change in appetite 2  Feeling bad or failure about yourself  0  Trouble concentrating 0  Moving slowly or fidgety/restless 0  Suicidal thoughts 0  PHQ-9 Score 9  Difficult doing work/chores Somewhat difficult     Quality of Life:  Quality of Life - 04/13/20 1212      Quality of Life   Select Quality of Life      Quality of Life Scores   Health/Function Pre 17.5 %    Socioeconomic Pre 19.75 %    Psych/Spiritual Pre 19.5 %    Family Pre 14.5 %    GLOBAL Pre 18.11 %            Nutrition & Weight - Outcomes:  Pre Biometrics - 04/13/20 1212      Pre Biometrics   Height 5' 6.75" (1.695 m)    Weight 157 lb 11.2 oz (71.5 kg)    BMI (Calculated) 24.9    Single Leg Stand 30 seconds            Nutrition:  Nutrition Therapy & Goals - 05/31/20 0832      Nutrition Therapy   Diet Heart healthy, low Na, pulmonary MNT    Drug/Food Interactions Statins/Certain Fruits    Protein (specify units) 80-85g    Fiber 30 grams    Whole Grain Foods 3 servings    Saturated Fats 12 max. grams    Fruits and Vegetables 5 servings/day    Sodium 1.5 grams      Personal Nutrition Goals   Nutrition Goal ST: add heart healthy fats, experiment with new fruits and vegetables, LT: Weight loss (feeks like he is carrying around a watermelon) - gained 20 pounds - getting much less exercise    Comments Pt primary diagnosis for Cardiac Rehab is s/p coronary artery placement. Pt also presents with CAD, HLD, PAD, STEMI, COPD - emphysema, and GERD. Pt is a current smoker - pt reports smoking 1 pack per day (45  years) and has 2 drinks/week. Current Relevant Medications: lipitor, wellbutrin, protonix. Disbility limits him - cant climb stairs or climb ladders. Eats once a day at night. D: "whatever I can catch, kill, or grow" last week had some jumbalaya, has a lot of venison, does not like sugar (will have one piece of sugar in his coffee in the morning). Honoring hunger. Grows cucumbers, tomatoes, squash, zucchini. Pt reports hating salt and will use very minimal - cut sodium when he was younger due to fathers heart attack. Uses animal fat with venison to make sausage and vegetable oil or butter with everything else.  Hasn't eaten at a restaurant in over 10 years. Discussed heart healthy eating and pulmonary MNT. Suggested adding more heart healthy fats and begin replacing the butter, having sausage less often to avoid the large amounts of saturated fat from the animal fat he is adding, and encouraged a variety of fruits and vegetables to make sure he is meeting his needs - especially given he is only having one meal a day. Encouraged pt not to restrict portions as he is only eating one meal a day and his needs are higher with his lung conditions.      Intervention Plan   Intervention Prescribe, educate and counsel regarding individualized specific dietary modifications aiming towards targeted core components such as weight, hypertension, lipid management, diabetes, heart failure and other comorbidities.;Nutrition handout(s) given to patient.    Expected Outcomes Short Term Goal: Understand basic principles of dietary content, such as calories, fat, sodium, cholesterol and nutrients.;Short Term Goal: A plan has been developed with personal nutrition goals set during dietitian appointment.;Long Term Goal: Adherence to prescribed nutrition plan.           Nutrition Discharge:  Nutrition Assessments - 04/13/20 1213      MEDFICTS Scores   Pre Score 33           Education Questionnaire Score:  Knowledge Questionnaire Score - 04/13/20 1213      Knowledge Questionnaire Score   Pre Score 22/26 Education Focus: Depression, heart failure, exercise           Goals reviewed with patient; copy given to patient.

## 2020-07-24 ENCOUNTER — Other Ambulatory Visit: Payer: Self-pay | Admitting: Cardiovascular Disease

## 2020-07-26 ENCOUNTER — Other Ambulatory Visit: Payer: Self-pay | Admitting: Cardiovascular Disease

## 2020-07-30 ENCOUNTER — Ambulatory Visit: Payer: Medicare Other

## 2020-08-03 ENCOUNTER — Other Ambulatory Visit: Payer: Self-pay

## 2020-08-03 ENCOUNTER — Ambulatory Visit (INDEPENDENT_AMBULATORY_CARE_PROVIDER_SITE_OTHER): Payer: Medicare Other | Admitting: Nurse Practitioner

## 2020-08-03 ENCOUNTER — Encounter: Payer: Self-pay | Admitting: Nurse Practitioner

## 2020-08-03 VITALS — BP 140/90 | HR 92 | Ht 67.0 in | Wt 160.0 lb

## 2020-08-03 DIAGNOSIS — E785 Hyperlipidemia, unspecified: Secondary | ICD-10-CM

## 2020-08-03 DIAGNOSIS — I2510 Atherosclerotic heart disease of native coronary artery without angina pectoris: Secondary | ICD-10-CM

## 2020-08-03 DIAGNOSIS — I739 Peripheral vascular disease, unspecified: Secondary | ICD-10-CM

## 2020-08-03 DIAGNOSIS — I10 Essential (primary) hypertension: Secondary | ICD-10-CM | POA: Diagnosis not present

## 2020-08-03 DIAGNOSIS — I200 Unstable angina: Secondary | ICD-10-CM | POA: Diagnosis not present

## 2020-08-03 DIAGNOSIS — Z23 Encounter for immunization: Secondary | ICD-10-CM | POA: Diagnosis not present

## 2020-08-03 MED ORDER — SPIRIVA HANDIHALER 18 MCG IN CAPS: 1.0000 | ORAL_CAPSULE | Freq: Every day | RESPIRATORY_TRACT | 4 refills | Status: AC

## 2020-08-03 MED ORDER — BUPROPION HCL ER (SR) 150 MG PO TB12
ORAL_TABLET | ORAL | 4 refills | Status: AC
Start: 2020-08-03 — End: ?

## 2020-08-03 MED ORDER — METOPROLOL SUCCINATE ER 50 MG PO TB24: 50.0000 mg | ORAL_TABLET | Freq: Every day | ORAL | 3 refills | Status: AC

## 2020-08-03 NOTE — Progress Notes (Signed)
Office Visit    Patient Name: [name] Date of Encounter: 08/03/2020  Primary Care Provider:  Medicine, [facility] Primary Cardiologist:  Lorine Bears, MD  Chief Complaint    59 y/o ? w/ a h/o CAD, PAD, HL, COPD, and tob abuse, who presents for f/u of CAD.  Past Medical History    Past Medical History:  Diagnosis Date  . COPD (chronic obstructive pulmonary disease) (HCC)   . Coronary artery disease 08/2017   a. 08/2017 Anterolateral STEMI/PCI: LAD mod prox dzs, D1 99 (PCI/DES), RCA mod dist dzs, EF nl; b. 02/2020 PCI: LM nl, LAD 70 (iFR abnl - 0.86 ->2.5x22 Resolute Onyx DES), D1 10ost in-stent, LCX mild dzs, OM1/3 min irres, RCA 36m. EF 55-65%.  . Diastolic dysfunction    a. 09/2017 Echo: EF 55-60%, severe mid antlat HK. Gr1 DD. Nl RV fxn. PASP .  . Emphysema lung (HCC)   . Hyperlipidemia   . Medical history non-contributory   . PAD (peripheral artery disease) (HCC)    a. Status post aortobifemoral bypass in 2015; b. 07/2020 ABI: R 1.17, L 1.18; Duplex showed patency of bypass.  . Tobacco use    Past Surgical History:  Procedure Laterality Date  . BYPASS GRAFT     in both lower limbs  . CORONARY STENT INTERVENTION N/A 03/07/2020   Procedure: CORONARY STENT INTERVENTION;  Surgeon: Iran Ouch, MD;  Location: [facility] INVASIVE CV LAB;  Service: Cardiovascular;  Laterality: N/A;  LAD  . CORONARY/GRAFT ACUTE MI REVASCULARIZATION N/A 09/13/2017   Procedure: Coronary/Graft Acute MI Revascularization;  Surgeon: Iran Ouch, MD;  Location: [facility] INVASIVE CV LAB;  Service: Cardiovascular;  Laterality: N/A;  . LEFT HEART CATH AND CORONARY ANGIOGRAPHY N/A 09/13/2017   Procedure: LEFT HEART CATH AND CORONARY ANGIOGRAPHY;  Surgeon: Iran Ouch, MD;  Location: [facility] INVASIVE CV LAB;  Service: Cardiovascular;  Laterality: N/A;  . LEFT HEART CATH AND CORONARY ANGIOGRAPHY Left 03/07/2020   Procedure: LEFT HEART CATH AND CORONARY ANGIOGRAPHY;   Surgeon: Iran Ouch, MD;  Location: [facility] INVASIVE CV LAB;  Service: Cardiovascular;  Laterality: Left;    Allergies  No Known Allergies  History of Present Illness    59 y/o ? w/ the above PMH including CAD, PAD s/p aortobifem bypass in 2015, HL, COPD, and tob abuse.  He is s/p anterolateral STMEI in 08/2017 w/ finding of subtotal occlusion of the large D1 and mod proximal LAD dzs.  He underwent successful DES placement to the D1.  Of note, drgu screen was + for cocaine @ that time.  In 07/2019, he had recurrent c/p but declined cath due to COVID-19 pandemic.  In 01/2020, he again reported intermittent angina and underwent dx cath 02/2020, revealing 70% prox LAD dzs w/ IFR of 0.86.  The D1 stent was patent and he had mod, nonobs RCA dzs (45%).  The LAD was successfully treated w/ a DES.  At his last office visit in June 2021, he noted symptomatic improvement following stenting.   He was participating in cardiac rehab but due to insurance issues, he had to drop out as he was being build $300 a month.  He has tried to remain active and has not had any recurrent angina.  He about once every 2 weeks he notes a somewhat sharp and fleeting central chest discomfort without associated symptoms, that occurs randomly and resolve spontaneously.  It is not similar to prior angina.  He has remained off of  cigarettes.  Recent ABIs were nl (R 1.17, L 1.18) and aortoiliac duplex showed patency of bypass.  He does note that his left foot might hurt during cold weather.  He denies palpitations, PND, orthopnea, dizziness, syncope, edema, or early satiety.  He has been depressed in the setting of some family issues.  He notes that when he was on Wellbutrin for smoking cessation, his mental state had improved significantly and he would be interested in a refill.  Home Medications    Prior to Admission medications   Medication Sig Start Date End Date Taking? Authorizing Provider  albuterol (PROVENTIL HFA;VENTOLIN  HFA) 108 (90 Base) MCG/ACT inhaler Inhale 2 puffs into the lungs every 6 (six) hours as needed for wheezing or shortness of breath. 12/31/18   Nita Sickle, MD  aspirin 81 MG chewable tablet Chew 1 tablet (81 mg total) by mouth daily. 09/16/17   Altamese Dilling, MD  atorvastatin (LIPITOR) 80 MG tablet TAKE 1 TABLET BY MOUTH ONCE DAILY AT  [facility] 07/26/20   Iran Ouch, MD  buPROPion (WELLBUTRIN SR) 150 MG 12 hr tablet Take 1 tablet (150 mg total) once daily for 3 days, then increase to 1 tablet (150 mg total) twice daily thereafter. 03/18/20   Creig Hines, NP  clopidogrel (PLAVIX) 75 MG tablet Take 1 tablet by mouth once daily 07/25/20   Iran Ouch, MD  ezetimibe (ZETIA) 10 MG tablet Take 1 tablet by mouth once daily 06/14/20   Sondra Barges, PA-C  metoprolol succinate (TOPROL-XL) 25 MG 24 hr tablet TAKE 1 TABLET BY MOUTH ONCE DAILY*TAKE WITH OR IMMEDIATELY FOLLOWING A MEAL* 05/23/20   Iran Ouch, MD  nitroGLYCERIN (NITROSTAT) 0.4 MG SL tablet DISSOLVE ONE TABLET UNDER THE TONGUE EVERY FIVE MINUTES AS NEEDED FOR CHEST PAIN 11/03/19   Iran Ouch, MD  pantoprazole (PROTONIX) 40 MG tablet Take 1 tablet (40 mg total) by mouth daily. 03/07/20   Sondra Barges, PA-C    Review of Systems    Occasional sharp and fleeting chest pain different from prior angina.  He denies palpitations, PND, orthopnea, dizziness, syncope, edema, or early satiety..  All other systems reviewed and are otherwise negative except as noted above.  Physical Exam    VS:  BP 140/90 (BP Location: Left Arm, Patient Position: Sitting, Cuff Size: Normal)   Pulse 92   Ht 5\' 7"  (1.702 m)   Wt 160 lb (72.6 kg)   BMI 25.06 kg/m  , BMI Body mass index is 25.06 kg/m. GEN: Well nourished, well developed, in no acute distress. HEENT: normal. Neck: Supple, no JVD, carotid bruits, or masses. Cardiac: RRR, no murmurs, rubs, or gallops. No clubbing, cyanosis, edema.  Radials 2+/PT 1+ on the right,  trace on the left. Respiratory:  Respirations regular and unlabored, clear to auscultation bilaterally. GI: Soft, nontender, nondistended, BS + x 4. MS: no deformity or atrophy. Skin: warm and dry, no rash. Neuro:  Strength and sensation are intact. Psych: Normal affect.  Accessory Clinical Findings    ECG personally reviewed by me today -regular sinus rhythm, 92- no acute changes.  Lab Results  Component Value Date   WBC 7.7 03/04/2020   HGB 14.3 03/04/2020   HCT 41.2 03/04/2020   MCV 86 03/04/2020   PLT 295 03/04/2020   Lab Results  Component Value Date   CREATININE 0.71 (L) 03/04/2020   BUN 3 (L) 03/04/2020   NA 136 03/04/2020   K 4.6 03/04/2020   CL 97  03/04/2020   CO2 22 03/04/2020   Lab Results  Component Value Date   ALT 37 03/04/2020   AST 50 (H) 03/04/2020   ALKPHOS 80 03/04/2020   BILITOT 0.4 03/04/2020   Lab Results  Component Value Date   CHOL 134 03/04/2020   HDL 65 03/04/2020   LDLCALC 54 03/04/2020   LDLDIRECT 55 03/04/2020   TRIG 75 03/04/2020   CHOLHDL 2.1 03/04/2020    Lab Results  Component Value Date   HGBA1C 5.5 09/13/2017    Assessment & Plan    1.  Coronary artery disease: Status post LAD drug-eluting stent placement earlier this year with patent diagonal stent and nonobstructive RCA disease.  He has been doing well since his last visit.  He did have to come out of cardiac rehab due to some billing issues but has not been experiencing angina or dyspnea.  He occasionally notes a sharp and fleeting chest pain which is different from prior angina and does not seem to be cardiac in origin.  He remains on aspirin, statin, Plavix, beta-blocker, and Zetia therapy.  2.  Essential hypertension: Blood pressure elevated today at 140/90 and 138/96 on repeat.  I am going to increase his Toprol-XL to 50 mg daily.  3.  Tobacco abuse: No longer smoking.  I congratulated him on this and encouraged him to remain off of cigarettes.  4.  Peripheral  arterial disease: Recent normal ABIs.  Follow-up in 1 year.  5.  Depression: Patient notes that when he was on Wellbutrin for smoking cessation, he noted significant improvement in mood.  He has had a family tragedy since then and would like to resume Wellbutrin therapy for management of depression.  He does not currently have primary care and will establish primary care until early next year.  I will provide him a prescription for Wellbutrin SR 150 mg daily x3 days then twice daily.  6.  Hyperlipidemia: LDL of 55 in May.  Continue statin therapy.  7.  COPD: On Spiriva and albuterol.  He is about to run out of Spiriva and as above, will not establish with his new primary care provider until February.  Prescription provided.    8.  Disposition: Patient will be provided a flu shot today at his request.  Follow-up in clinic in 6 months or sooner if necessary.   Nicolasa Ducking, NP 08/03/2020, 11:02 AM

## 2020-08-03 NOTE — Patient Instructions (Signed)
Medication Instructions:  - Your physician has recommended you make the following change in your medication:   1) INCREASE toprol XL (metoprolol succinate) to 50 mg- take 1 tablet by mouth once daily   *If you need a refill on your cardiac medications before your next appointment, please call your pharmacy*   Lab Work: - none ordered  If you have labs (blood work) drawn today and your tests are completely normal, you will receive your results only by: Marland Kitchen MyChart Message (if you have MyChart) OR . A paper copy in the mail If you have any lab test that is abnormal or we need to change your treatment, we will call you to review the results.   Testing/Procedures: - none ordered   Follow-Up: At [facility], you and your health needs are our priority.  As part of our continuing mission to provide you with exceptional heart care, we have created designated Provider Care Teams.  These Care Teams include your primary Cardiologist (physician) and Advanced Practice Providers (APPs -  Physician Assistants and Nurse Practitioners) who all work together to provide you with the care you need, when you need it.  We recommend signing up for the patient portal called "MyChart".  Sign up information is provided on this After Visit Summary.  MyChart is used to connect with patients for Virtual Visits (Telemedicine).  Patients are able to view lab/test results, encounter notes, upcoming appointments, etc.  Non-urgent messages can be sent to your provider as well.   To learn more about what you can do with MyChart, go to [email or web address].    Your next appointment:   6 month(s)  The format for your next appointment:   In Person  Provider:   You may see Lorine Bears, MD or one of the following Advanced Practice Providers on your designated Care Team:    Nicolasa Ducking, NP  Eula Listen, PA-C  Marisue Ivan, PA-C  Cadence Fransico Michael, [place]    Other Instructions  1) Flu Shot given  today

## 2020-08-13 ENCOUNTER — Ambulatory Visit: Payer: Medicare Other

## 2020-08-16 ENCOUNTER — Encounter: Payer: Self-pay | Admitting: *Deleted

## 2020-08-16 DIAGNOSIS — Z955 Presence of coronary angioplasty implant and graft: Secondary | ICD-10-CM

## 2020-08-16 NOTE — Progress Notes (Signed)
Cardiac Individual Treatment Plan  Patient Details  Name: [name] MRN: [id number] Date of Birth: 10-15-1961 Referring Provider:     Cardiac Rehab from 04/13/2020 in [facility] Cardiac and Pulmonary Rehab  Referring Provider Kathlyn Sacramento MD      Initial Encounter Date:    Cardiac Rehab from 04/13/2020 in [facility] Cardiac and Pulmonary Rehab  Date 04/13/20      Visit Diagnosis: Status post coronary artery stent placement  Patient's Home Medications on Admission:  Current Outpatient Medications:  .  albuterol (PROVENTIL HFA;VENTOLIN HFA) 108 (90 Base) MCG/ACT inhaler, Inhale 2 puffs into the lungs every 6 (six) hours as needed for wheezing or shortness of breath., Disp: 1 Inhaler, Rfl: 2 .  aspirin 81 MG chewable tablet, Chew 1 tablet (81 mg total) by mouth daily., Disp: 30 tablet, Rfl: 0 .  atorvastatin (LIPITOR) 80 MG tablet, TAKE 1 TABLET BY MOUTH ONCE DAILY AT  6PM, Disp: 90 tablet, Rfl: 0 .  buPROPion (WELLBUTRIN SR) 150 MG 12 hr tablet, Take 1 tablet (150 mg) by mouth twice daily, Disp: 60 tablet, Rfl: 4 .  clopidogrel (PLAVIX) 75 MG tablet, Take 1 tablet by mouth once daily, Disp: 90 tablet, Rfl: 0 .  ezetimibe (ZETIA) 10 MG tablet, Take 1 tablet by mouth once daily, Disp: 90 tablet, Rfl: 0 .  metoprolol succinate (TOPROL-XL) 50 MG 24 hr tablet, Take 1 tablet (50 mg total) by mouth daily. Take with or immediately following a meal., Disp: 90 tablet, Rfl: 3 .  nitroGLYCERIN (NITROSTAT) 0.4 MG SL tablet, DISSOLVE ONE TABLET UNDER THE TONGUE EVERY FIVE MINUTES AS NEEDED FOR CHEST PAIN, Disp: 25 tablet, Rfl: 0 .  pantoprazole (PROTONIX) 40 MG tablet, Take 1 tablet (40 mg total) by mouth daily., Disp: 30 tablet, Rfl: 5 .  SPIRIVA HANDIHALER 18 MCG inhalation capsule, Place 1 capsule (18 mcg total) into inhaler and inhale daily., Disp: 30 capsule, Rfl: 4  Past Medical History: Past Medical History:  Diagnosis Date  . COPD (chronic obstructive pulmonary disease) ([place])   .  Coronary artery disease 08/2017   a. 08/2017 Anterolateral STEMI/PCI: LAD mod prox dzs, D1 99 (PCI/DES), RCA mod dist dzs, EF nl; b. 02/2020 PCI: LM nl, LAD 70 (iFR abnl - 0.86 ->2.5x22 Resolute Onyx DES), D1 10ost in-stent, LCX mild dzs, OM1/3 min irres, RCA 30m EF 55-65%.  . Diastolic dysfunction    a. 09/2017 Echo: EF 55-60%, severe mid antlat HK. Gr1 DD. Nl RV fxn. PASP 238mg.  . Emphysema lung ([facility]  . Hyperlipidemia   . Medical history non-contributory   . PAD (peripheral artery disease) ([facility]   a. Status post aortobifemoral bypass in 2015; b. 07/2020 ABI: R 1.17, L 1.18; Duplex showed patency of bypass.  . Tobacco use     Tobacco Use: Social History   Tobacco Use  Smoking Status Current Every Day Smoker  . Packs/day: 1.00  . Years: 45.00  . Pack years: 45.00  . Types: Cigarettes  Smokeless Tobacco Never Used  Tobacco Comment   8/18 still one cigarette a day with morning coffee    Labs: Recent Review Flowsheet Data    Labs for ITP Cardiac and Pulmonary Rehab Latest Ref Rng & Units 09/13/2017 09/14/2017 12/20/2017 06/23/2019 03/04/2020   Cholestrol 100 - 199 mg/dL 192 171 170 170 134   LDLCALC 0 - 99 mg/dL 100(H) 85 94 80 54   LDLDIRECT 0 - 99 mg/dL - - - 87.4 55   HDL >39 mg/dL 78  68 51 67 65   Trlycerides 0 - 149 mg/dL 68 91 123 113 75   Hemoglobin A1c 4.8 - 5.6 % 5.5 - - - -       Exercise Target Goals: Exercise Program Goal: Individual exercise prescription set using results from initial 6 min walk test and THRR while considering  patient's activity barriers and safety.   Exercise Prescription Goal: Initial exercise prescription builds to 30-45 minutes a day of aerobic activity, 2-3 days per week.  Home exercise guidelines will be given to patient during program as part of exercise prescription that the participant will acknowledge.   Education: Aerobic Exercise & Resistance Training: - Gives group verbal and written instruction on the various components of  exercise. Focuses on aerobic and resistive training programs and the benefits of this training and how to safely progress through these programs..   Cardiac Rehab from 06/15/2020 in [facility] Cardiac and Pulmonary Rehab  Date 06/08/20  Educator [facility]  Instruction Review Code 1- Verbalizes Understanding      Education: Exercise & Equipment Safety: - Individual verbal instruction and demonstration of equipment use and safety with use of the equipment.   Cardiac Rehab from 06/15/2020 in [facility] Cardiac and Pulmonary Rehab  Date 04/13/20  Educator [facility]  Instruction Review Code 1- Verbalizes Understanding      Education: Exercise Physiology & General Exercise Guidelines: - Group verbal and written instruction with models to review the exercise physiology of the cardiovascular system and associated critical values. Provides general exercise guidelines with specific guidelines to those with heart or lung disease.    Cardiac Rehab from 06/15/2020 in [facility] Cardiac and Pulmonary Rehab  Date 04/13/20  Instruction Review Code 3- Needs Reinforcement  [need identified]      Education: Flexibility, Balance, Mind/Body Relaxation: Provides group verbal/written instruction on the benefits of flexibility and balance training, including mind/body exercise modes such as yoga, pilates and tai chi.  Demonstration and skill practice provided.   Cardiac Rehab from 06/15/2020 in [facility] Cardiac and Pulmonary Rehab  Date 06/15/20  Educator as  Instruction Review Code 1- Verbalizes Understanding      Activity Barriers & Risk Stratification:   6 Minute Walk:   Oxygen Initial Assessment:   Oxygen Re-Evaluation:   Oxygen Discharge (Final Oxygen Re-Evaluation):   Initial Exercise Prescription:   Perform Capillary Blood Glucose checks as needed.  Exercise Prescription Changes:   Exercise Comments:   Exercise Goals and Review:   Exercise Goals Re-Evaluation :  Exercise Goals Re-Evaluation    Row Name 06/17/20  1016             Exercise Goal Re-Evaluation   Exercise Goals Review Increase Physical Activity;Increase Strength and Stamina;Understanding of Exercise Prescription       Comments Ruby continues to do well in rehab.  He is active outside of rehab and does not do any structured exercise. Pt has many stressors right now and discussed how exercise could help - especially things such as yoga.       Expected Outcomes Short: exercise outside of rehab Long: Continue to improve stamina.              Discharge Exercise Prescription (Final Exercise Prescription Changes):   Nutrition:  Target Goals: Understanding of nutrition guidelines, daily intake of sodium <1522m, cholesterol <2080m calories 30% from fat and 7% or less from saturated fats, daily to have 5 or more servings of fruits and vegetables.  Education: Controlling Sodium/Reading Food Labels -Group verbal and written  material supporting the discussion of sodium use in heart healthy nutrition. Review and explanation with models, verbal and written materials for utilization of the food label.   Education: General Nutrition Guidelines/Fats and Fiber: -Group instruction provided by verbal, written material, models and posters to present the general guidelines for heart healthy nutrition. Gives an explanation and review of dietary fats and fiber.   Biometrics:    Nutrition Therapy Plan and Nutrition Goals:   Nutrition Assessments:   MEDIFICTS Score Key:          ?70 Need to make dietary changes          40-70 Heart Healthy Diet         ? 40 Therapeutic Level Cholesterol Diet  Nutrition Goals Re-Evaluation:   Nutrition Goals Discharge (Final Nutrition Goals Re-Evaluation):   Psychosocial: Target Goals: Acknowledge presence or absence of significant depression and/or stress, maximize coping skills, provide positive support system. Participant is able to verbalize types and ability to use techniques and skills needed for  reducing stress and depression.   Education: Depression - Provides group verbal and written instruction on the correlation between heart/lung disease and depressed mood, treatment options, and the stigmas associated with seeking treatment.   Cardiac Rehab from 06/15/2020 in [facility] Cardiac and Pulmonary Rehab  Date 04/13/20  Instruction Review Code 3- Needs Reinforcement  [need identified]      Education: Sleep Hygiene -Provides group verbal and written instruction about how sleep can affect your health.  Define sleep hygiene, discuss sleep cycles and impact of sleep habits. Review good sleep hygiene tips.     Education: Stress and Anxiety: - Provides group verbal and written instruction about the health risks of elevated stress and causes of high stress.  Discuss the correlation between heart/lung disease and anxiety and treatment options. Review healthy ways to manage with stress and anxiety.    Initial Review & Psychosocial Screening:   Quality of Life Scores:   Scores of 19 and below usually indicate a poorer quality of life in these areas.  A difference of  2-3 points is a clinically meaningful difference.  A difference of 2-3 points in the total score of the Quality of Life Index has been associated with significant improvement in overall quality of life, self-image, physical symptoms, and general health in studies assessing change in quality of life.  PHQ-9: Recent Review Flowsheet Data    Depression screen [facility] 2/9 04/13/2020   Decreased Interest 2   Down, Depressed, Hopeless 0   PHQ - 2 Score 2   Altered sleeping 3   Tired, decreased energy 2   Change in appetite 2   Feeling bad or failure about yourself  0   Trouble concentrating 0   Moving slowly or fidgety/restless 0   Suicidal thoughts 0   PHQ-9 Score 9   Difficult doing work/chores Somewhat difficult     Interpretation of Total Score  Total Score Depression Severity:  1-4 = Minimal depression, 5-9 = Mild  depression, 10-14 = Moderate depression, 15-19 = Moderately severe depression, 20-27 = Severe depression   Psychosocial Evaluation and Intervention:   Psychosocial Re-Evaluation:  Psychosocial Re-Evaluation    [place] Name 06/17/20 1008             Psychosocial Re-Evaluation   Current issues with Current Sleep Concerns;Current Stress Concerns       Comments Sleeping is hard and he will usually toss and turn at night and has sleep maintenance insomnia. Discussed sleep hygiene. He  wakes up early and waits for the sun to come up. Last week his friend died, uncle committed suicide and killed his wife - pt reports having family for support. HE is now preparing for his friends kids to come over which is also stressful. 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 Stressed also because he is getting charged for his visits becuase they never sent it to [employer] - he will call again next week.       Expected Outcomes Short: continue to attend to help manage stress Long: maintain positive attitude       Interventions Stress management education;Relaxation education;Encouraged to attend Cardiac Rehabilitation for the exercise       Continue Psychosocial Services  Follow up required by staff       Comments Mother has dementia and shares caregiving responsibility with sister (1 month at a time),  Been on disability since 2018 with first MI, Rabbit is eating garden, Does not sleep well at least twice a week only a few hours. Last week his friend died, uncle committed suicide and killed his wife - pt reports having family for support. HE is now preparing for his friends kids to come over which is also stressful. 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 Stressed also because he is getting charged for his visits becuase they never sent it to [employer] - he will call again next week.Last week his friend died, uncle committed suicide and killed his wife - pt reports having family for  support. HE is now preparing for his friends kids to come over which is also stressful. 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 Stressed also because he is getting charged for his visits becuase they never sent it to [employer] - he will call again next week.         Initial Review   Source of Stress Concerns Chronic Illness;Retirement/disability;Family;Financial              Psychosocial Discharge (Final Psychosocial Re-Evaluation):  Psychosocial Re-Evaluation - 06/17/20 1008      Psychosocial Re-Evaluation   Current issues with Current Sleep Concerns;Current Stress Concerns    Comments Sleeping is hard and he will usually toss and turn at night and has sleep maintenance insomnia. Discussed sleep hygiene. He wakes up early and waits for the sun to come up. Last week his friend died, uncle committed suicide and killed his wife - pt reports having family for support. HE is now preparing for his friends kids to come over which is also stressful. 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 Stressed also because he is getting charged for his visits becuase they never sent it to [employer] - he will call again next week.    Expected Outcomes Short: continue to attend to help manage stress Long: maintain positive attitude    Interventions Stress management education;Relaxation education;Encouraged to attend Cardiac Rehabilitation for the exercise    Continue Psychosocial Services  Follow up required by staff    Comments Mother has dementia and shares caregiving responsibility with sister (1 month at a time),  Been on disability since 2018 with first MI, Rabbit is eating garden, Does not sleep well at least twice a week only a few hours. Last week his friend died, uncle committed suicide and killed his wife - pt reports having family for support. HE is now preparing for his friends kids to come over which is also stressful.  no self-care techniquies - will tie flies for fly  fishing. Stressed also because he is getting charged for his visits becuase they never sent it to [employer] - he will call again next week.Last week his friend died, uncle committed suicide and killed his wife - pt reports having family for support. HE is now preparing for his friends kids to come over which is also stressful. 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 Stressed also because he is getting charged for his visits becuase they never sent it to [employer] - he will call again next week.      Initial Review   Source of Stress Concerns Chronic Illness;Retirement/disability;Family;Financial           Vocational Rehabilitation: Provide vocational rehab assistance to qualifying candidates.   Vocational Rehab Evaluation & Intervention:   Education: Education Goals: Education classes will be provided on a variety of topics geared toward better understanding of heart health and risk factor modification. Participant will state understanding/return demonstration of topics presented as noted by education test scores.  Learning Barriers/Preferences:   General Cardiac Education Topics:  AED/CPR: - Group verbal and written instruction with the use of models to demonstrate the basic use of the AED with the basic ABC's of resuscitation.   Anatomy & Physiology of the Heart: - Group verbal and written instruction and models provide basic cardiac anatomy and physiology, with the coronary electrical and arterial systems. Review of Valvular disease and Heart Failure   Cardiac Procedures: - Group verbal and written instruction to review commonly prescribed medications for heart disease. Reviews the medication, class of the drug, and side effects. Includes the steps to properly store meds and maintain the prescription regimen. (beta blockers and nitrates)   Cardiac Rehab from 06/15/2020 in [facility] Cardiac and Pulmonary Rehab  Date 06/08/20  Educator SB  Instruction Review Code 1-  Verbalizes Understanding      Cardiac Medications I: - Group verbal and written instruction to review commonly prescribed medications for heart disease. Reviews the medication, class of the drug, and side effects. Includes the steps to properly store meds and maintain the prescription regimen.   Cardiac Rehab from 06/15/2020 in [facility] Cardiac and Pulmonary Rehab  Date 05/11/20  Educator SB  Instruction Review Code 1- Verbalizes Understanding      Cardiac Medications II: -Group verbal and written instruction to review commonly prescribed medications for heart disease. Reviews the medication, class of the drug, and side effects. (all other drug classes)   Cardiac Rehab from 06/15/2020 in [facility] Cardiac and Pulmonary Rehab  Date 04/13/20  Instruction Review Code 3- Needs Reinforcement  [need identified]       Go Sex-Intimacy & Heart Disease, Get SMART - Goal Setting: - Group verbal and written instruction through game format to discuss heart disease and the return to sexual intimacy. Provides group verbal and written material to discuss and apply goal setting through the application of the S.M.A.R.T. Method.   Cardiac Rehab from 06/15/2020 in [facility] Cardiac and Pulmonary Rehab  Date 06/08/20  Educator SB  Instruction Review Code 1- Verbalizes Understanding      Other Matters of the Heart: - Provides group verbal, written materials and models to describe Stable Angina and Peripheral Artery. Includes description of the disease process and treatment options available to the cardiac patient.   Infection Prevention: - Provides verbal and written material to individual with discussion of infection control including proper hand washing and proper equipment cleaning during exercise session.  Cardiac Rehab from 06/15/2020 in [facility] Cardiac and Pulmonary Rehab  Date 04/13/20  Educator [facility]  Instruction Review Code 1- Verbalizes Understanding      Falls Prevention: - Provides verbal and written  material to individual with discussion of falls prevention and safety.   Cardiac Rehab from 06/15/2020 in [facility] Cardiac and Pulmonary Rehab  Date 04/13/20  Educator [facility]  Instruction Review Code 1- Verbalizes Understanding      Other: -Provides group and verbal instruction on various topics (see comments)   Knowledge Questionnaire Score:   Core Components/Risk Factors/Patient Goals at Admission:   Education:Diabetes - Individual verbal and written instruction to review signs/symptoms of diabetes, desired ranges of glucose level fasting, after meals and with exercise. Acknowledge that pre and post exercise glucose checks will be done for 3 sessions at entry of program.   Education: Know Your Numbers and Risk Factors: -Group verbal and written instruction about important numbers in your health.  Discussion of what are risk factors and how they play a role in the disease process.  Review of Cholesterol, Blood Pressure, Diabetes, and BMI and the role they play in your overall health.   Cardiac Rehab from 06/15/2020 in [facility] Cardiac and Pulmonary Rehab  Date 04/13/20  Instruction Review Code 3- Needs Reinforcement  [need identified]      Core Components/Risk Factors/Patient Goals Review:   Goals and Risk Factor Review    Row Name 06/17/20 1003             Core Components/Risk Factors/Patient Goals Review   Personal Goals Review Tobacco Cessation;Hypertension       Review Quit smoking in July - on medication to help with this and says while on it he is doing well, but next week he will be out of them and doesn't know how he will do. Does not have a way to check BP at home.  BP has been around 130s over 70s at rehab, taking BP medication as directed.       Expected Outcomes Short:  continue to take meds as directed Long:  manage risk factors              Core Components/Risk Factors/Patient Goals at Discharge (Final Review):   Goals and Risk Factor Review - 06/17/20 1003      Core  Components/Risk Factors/Patient Goals Review   Personal Goals Review Tobacco Cessation;Hypertension    Review Quit smoking in July - on medication to help with this and says while on it he is doing well, but next week he will be out of them and doesn't know how he will do. Does not have a way to check BP at home.  BP has been around 130s over 70s at rehab, taking BP medication as directed.    Expected Outcomes Short:  continue to take meds as directed Long:  manage risk factors           ITP Comments:  ITP Comments    Row Name 06/22/20 1658 06/30/20 0832 07/20/20 0717 07/21/20 1040 08/16/20 0838   ITP Comments 30 day review completed. ITP sent to Dr. Emily Filbert, Medical Director of Cardiac and Pulmonary Rehab. Continue with ITP unless changes are made by physician. Myles has not attended since last review . 30 Day review completed. Medical Director ITP review done, changes made as directed, and signed approval by Medical Director.  Okie has been absent secondary to his bill. He cannot afford the amount billed. Speaking with him, it sounds like  his secondary insurance had not been filed. Once filed he should owe $3 per session. He will not return until this is resolved. Bauer is being discharged from the program due to insurance complications and scheduling conflicts Finian is being discharged from the program due to insurance complications and scheduling conflicts          Comments: Discharged

## 2020-08-27 ENCOUNTER — Other Ambulatory Visit: Payer: Self-pay | Admitting: Physician Assistant

## 2020-09-20 ENCOUNTER — Other Ambulatory Visit: Payer: Self-pay | Admitting: Physician Assistant

## 2020-10-18 ENCOUNTER — Telehealth: Payer: Self-pay

## 2020-10-18 DIAGNOSIS — I509 Heart failure, unspecified: Secondary | ICD-10-CM

## 2020-10-18 NOTE — Telephone Encounter (Signed)
Spoke with the patient. Patient declined an appt in the month of January an rqst an early Feb 2022 appt.  Appt scheduled on 11/15/20 @ 3pm with Dr. Kirke Corin. Adv him to contact the office sooner if symptoms worsen.  Patient sts that he was started on Lasix 20 mg daily by Dr. Lazarus Salines. Pt weight was still fluctuating and Dr. Lazarus Salines increased Lasix 40 mg daily. Patient sts that he ran out of the medication sooner than 30 days, it was not covered by insurance since he filled it sooner. Patient is in between pcps and will see Dr. Judithann Sheen for the first time some tine in Feb. Patient is asking if Dr. Kirke Corin would auth the refill. Patient sts that potassium was not prescribed.  Adv the patient that I will fwd the rqst to Dr.Arida for approval.

## 2020-10-18 NOTE — Telephone Encounter (Signed)
Okay to refill Lasix 40 mg once daily but also given K-Dur 20 milliequivalents once daily.  Check basic metabolic profile and BNP 5 days after resuming Lasix.

## 2020-10-18 NOTE — Telephone Encounter (Signed)
Iran Ouch, MD  Laruth Bouchard, MD; Jarvis Newcomer, RN Phone Number: [phone]   Thanks for letting me know. He was last seen in October and was doing reasonably well at that time. He is not coming back to see [place] until April but will see if he wants to come back before then.   Misty Stanley,  Can you check on the patient next week and schedule a closer follow up appointment? Thanks.        Previous Messages   ----- Message -----  From: Laruth Bouchard, MD  Sent: 10/11/2020 11:15 AM EST  To: Marguarite Arbour, MD, Iran Ouch, MD  Subject: New Symptoms                   Hello Dr. Kirke Corin,  I just wanted to make you aware of some concerns about our mutual patient, Mr. Heilman. I saw him recently and he was complaining of increased abdominal girth and increasing exertional dyspnea and fatigue for some months. He is transferring his primary care to Dr. Aram Beecham at [facility] since I will leaving at the end of the year. I considered possible fluid buildup from new CHF or chronic liver disease given long history of alcohol abuse and CAD. I recommended labs, an abdominal ultrasound and repeat echocardiogram but he requested that I defer workup until he sees new PCP in February. I advised him to call you to get in sooner if symptoms worsen.  Respectfully,  Laruth Bouchard, MD

## 2020-10-19 MED ORDER — POTASSIUM CHLORIDE ER 20 MEQ PO TBCR: 20.0000 meq | EXTENDED_RELEASE_TABLET | Freq: Every day | ORAL | 1 refills | Status: AC

## 2020-10-19 MED ORDER — FUROSEMIDE 40 MG PO TABS: 40.0000 mg | ORAL_TABLET | Freq: Every day | ORAL | 1 refills | Status: AC

## 2020-10-19 NOTE — Telephone Encounter (Signed)
Patient made aware of Dr. Jari Sportsman response and recommendation. Rx for Lasix 40 mg qd and Potassium 20 meq qd has been sent to the pt pharmacy. Pt to have lab work in 5 days. Lab orders for a bnp and bmp to be drawn [facility] have been placed.  Patient verbalized understanding to instructions given and voiced appreciation for the call.

## 2020-10-26 ENCOUNTER — Other Ambulatory Visit
Admission: RE | Admit: 2020-10-26 | Discharge: 2020-10-26 | Disposition: A | Discharge status: Home / Self Care | Payer: Medicare Other | Attending: Cardiovascular Disease | Admitting: Cardiovascular Disease

## 2020-10-26 ENCOUNTER — Other Ambulatory Visit: Payer: Self-pay

## 2020-10-26 DIAGNOSIS — I509 Heart failure, unspecified: Secondary | ICD-10-CM | POA: Diagnosis present

## 2020-10-26 LAB — BASIC METABOLIC PANEL
Anion gap: 14 (ref 5–15)
BUN: 7 mg/dL (ref 6–20)
CO2: 23 mmol/L (ref 22–32)
Calcium: 9.6 mg/dL (ref 8.9–10.3)
Chloride: 96 mmol/L — ABNORMAL LOW (ref 98–111)
Creatinine, Ser: 0.81 mg/dL (ref 0.61–1.24)
GFR, Estimated: >60 mL/min (ref >60)
Glucose, Bld: 108 mg/dL — ABNORMAL HIGH (ref 70–99)
Potassium: 3.9 mmol/L (ref 3.5–5.1)
Sodium: 133 mmol/L — ABNORMAL LOW (ref 135–145)

## 2020-10-26 LAB — BRAIN NATRIURETIC PEPTIDE: B Natriuretic Peptide: 54.5 pg/mL (ref 0.0–100.0)

## 2020-10-27 ENCOUNTER — Other Ambulatory Visit: Payer: Self-pay | Admitting: Cardiovascular Disease

## 2020-11-01 ENCOUNTER — Telehealth: Payer: Self-pay

## 2020-11-02 NOTE — Telephone Encounter (Signed)
Spoke with the patients sister. They are not sure on the cause of death. There was nothing suspicious and the coroner has not been called. Patient was found in his home yesterday. He was in the bathroom. It looked as if he was attempting to change the toilet paper.  They think that he passed away on Feb 13, 2023.  Expressed our condolences to the patients family. Pt sister voiced appreciation for the call.

## 2020-11-15 ENCOUNTER — Ambulatory Visit: Payer: Medicare Other | Admitting: Cardiovascular Disease

## 2020-11-15 NOTE — Telephone Encounter (Signed)
-----   Message from Iran Ouch, MD sent at 10/31/2020  4:08 PM EST ----- Inform patient that labs showed normal BNP indicating no excess fluid and normal renal function.

## 2020-11-15 NOTE — Telephone Encounter (Signed)
Noted. Will fwd the update to Dr. Kirke Corin.

## 2020-11-15 NOTE — Telephone Encounter (Signed)
That is sad and surprising. Do they know the cause of death?

## 2020-11-15 NOTE — Telephone Encounter (Signed)
Called to give the patient lab results. [email or web address]

## 2020-11-15 NOTE — Telephone Encounter (Signed)
Sister calling in to state that patient has passed away sometime between now and sunday

## 2020-11-15 DEATH — deceased

## 2020-12-14 ENCOUNTER — Telehealth: Payer: Self-pay | Admitting: Cardiovascular Disease

## 2020-12-14 NOTE — Telephone Encounter (Signed)
Patients sister called in to state patient has passed back in 11-07-22

## 2022-04-02 IMAGING — CR DG CHEST 2V
1 series · 2 of 2 positions shown · non-contrast
Comparison: Chest x-ray 12/31/2018.  CT chest 12/31/2018.

CLINICAL DATA: Cough and congestion.

EXAM:
CHEST - 2 VIEW

[Series 1: dg chest 2 view · 0.14mm/px · 2 of 2 slices shown]
[im 1/2]
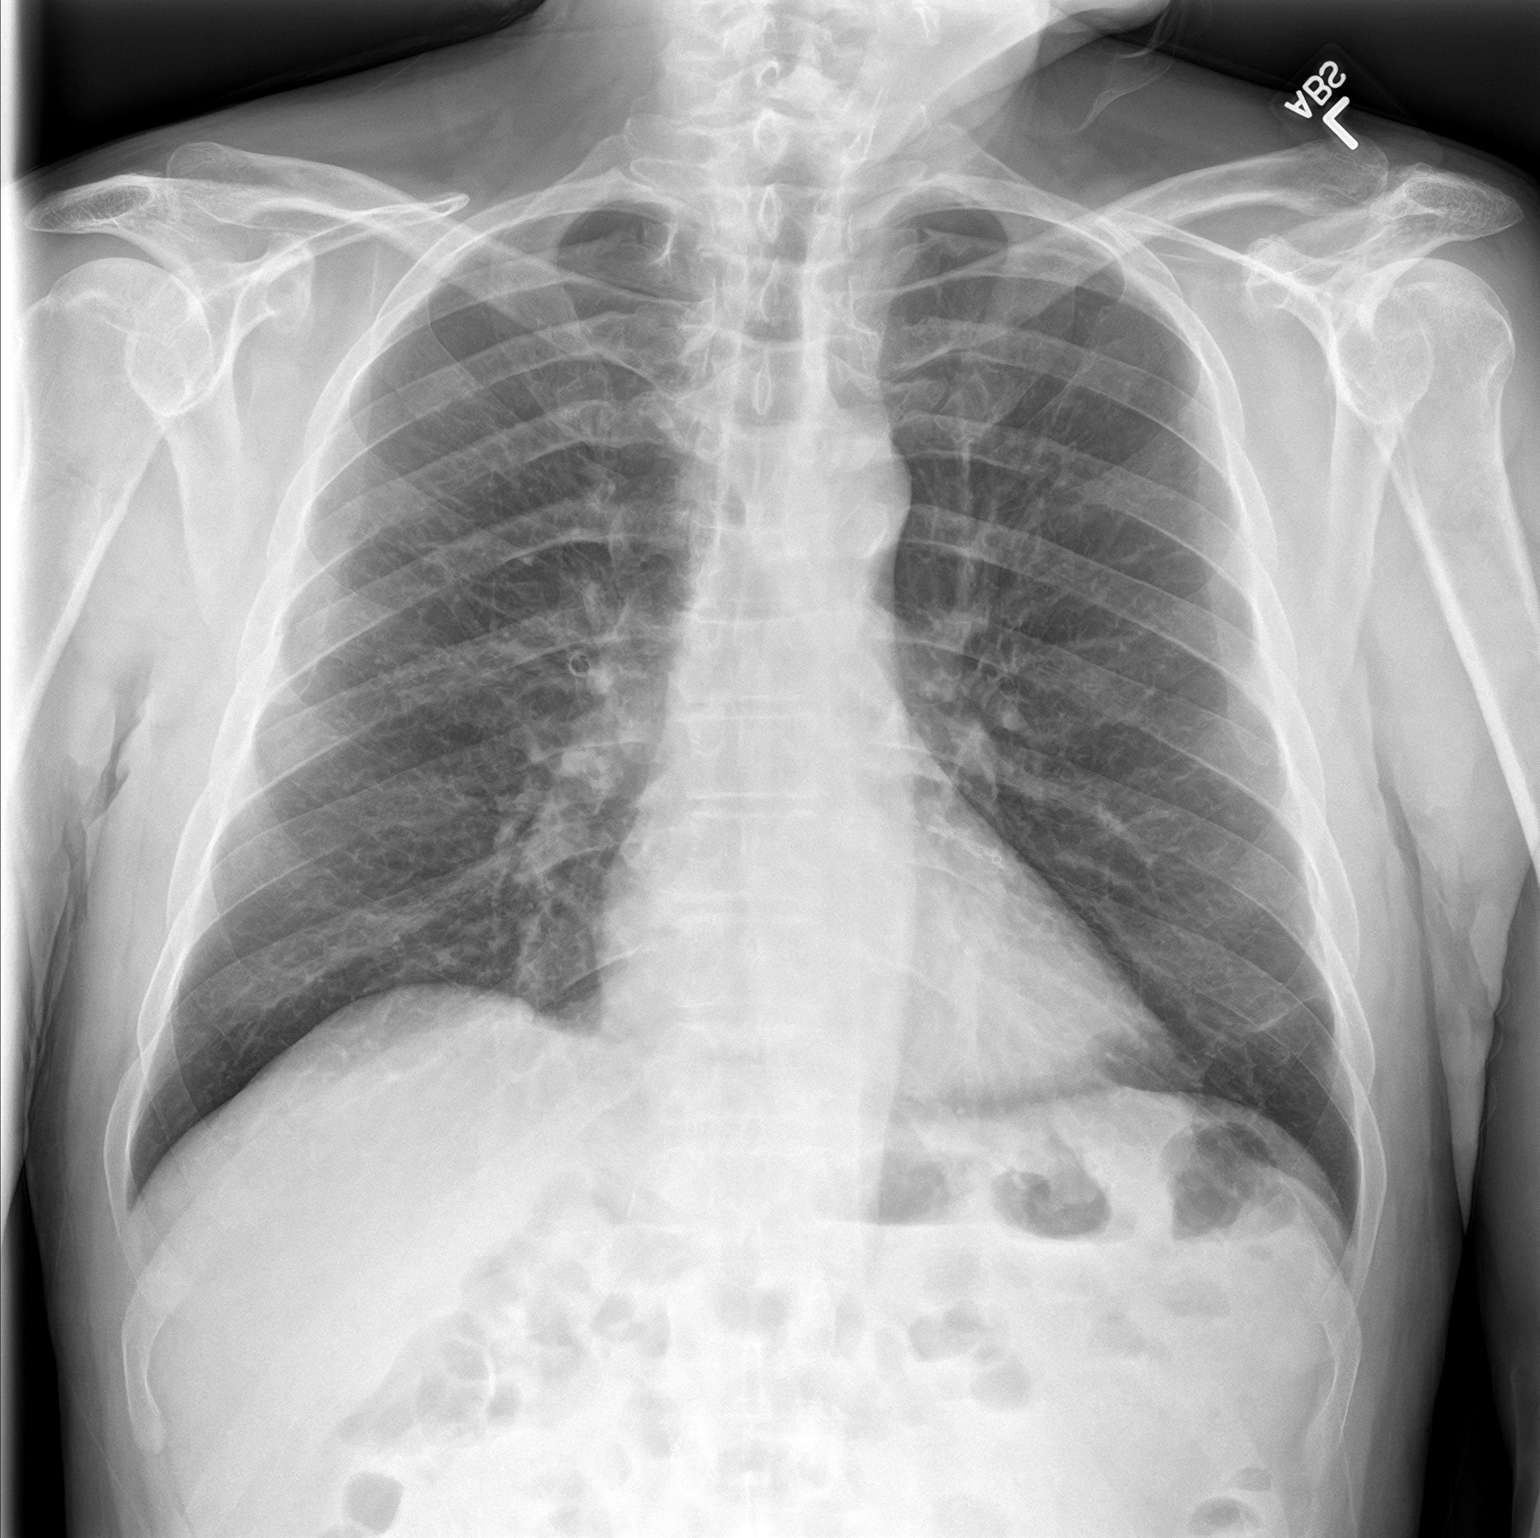
[im 2/2]
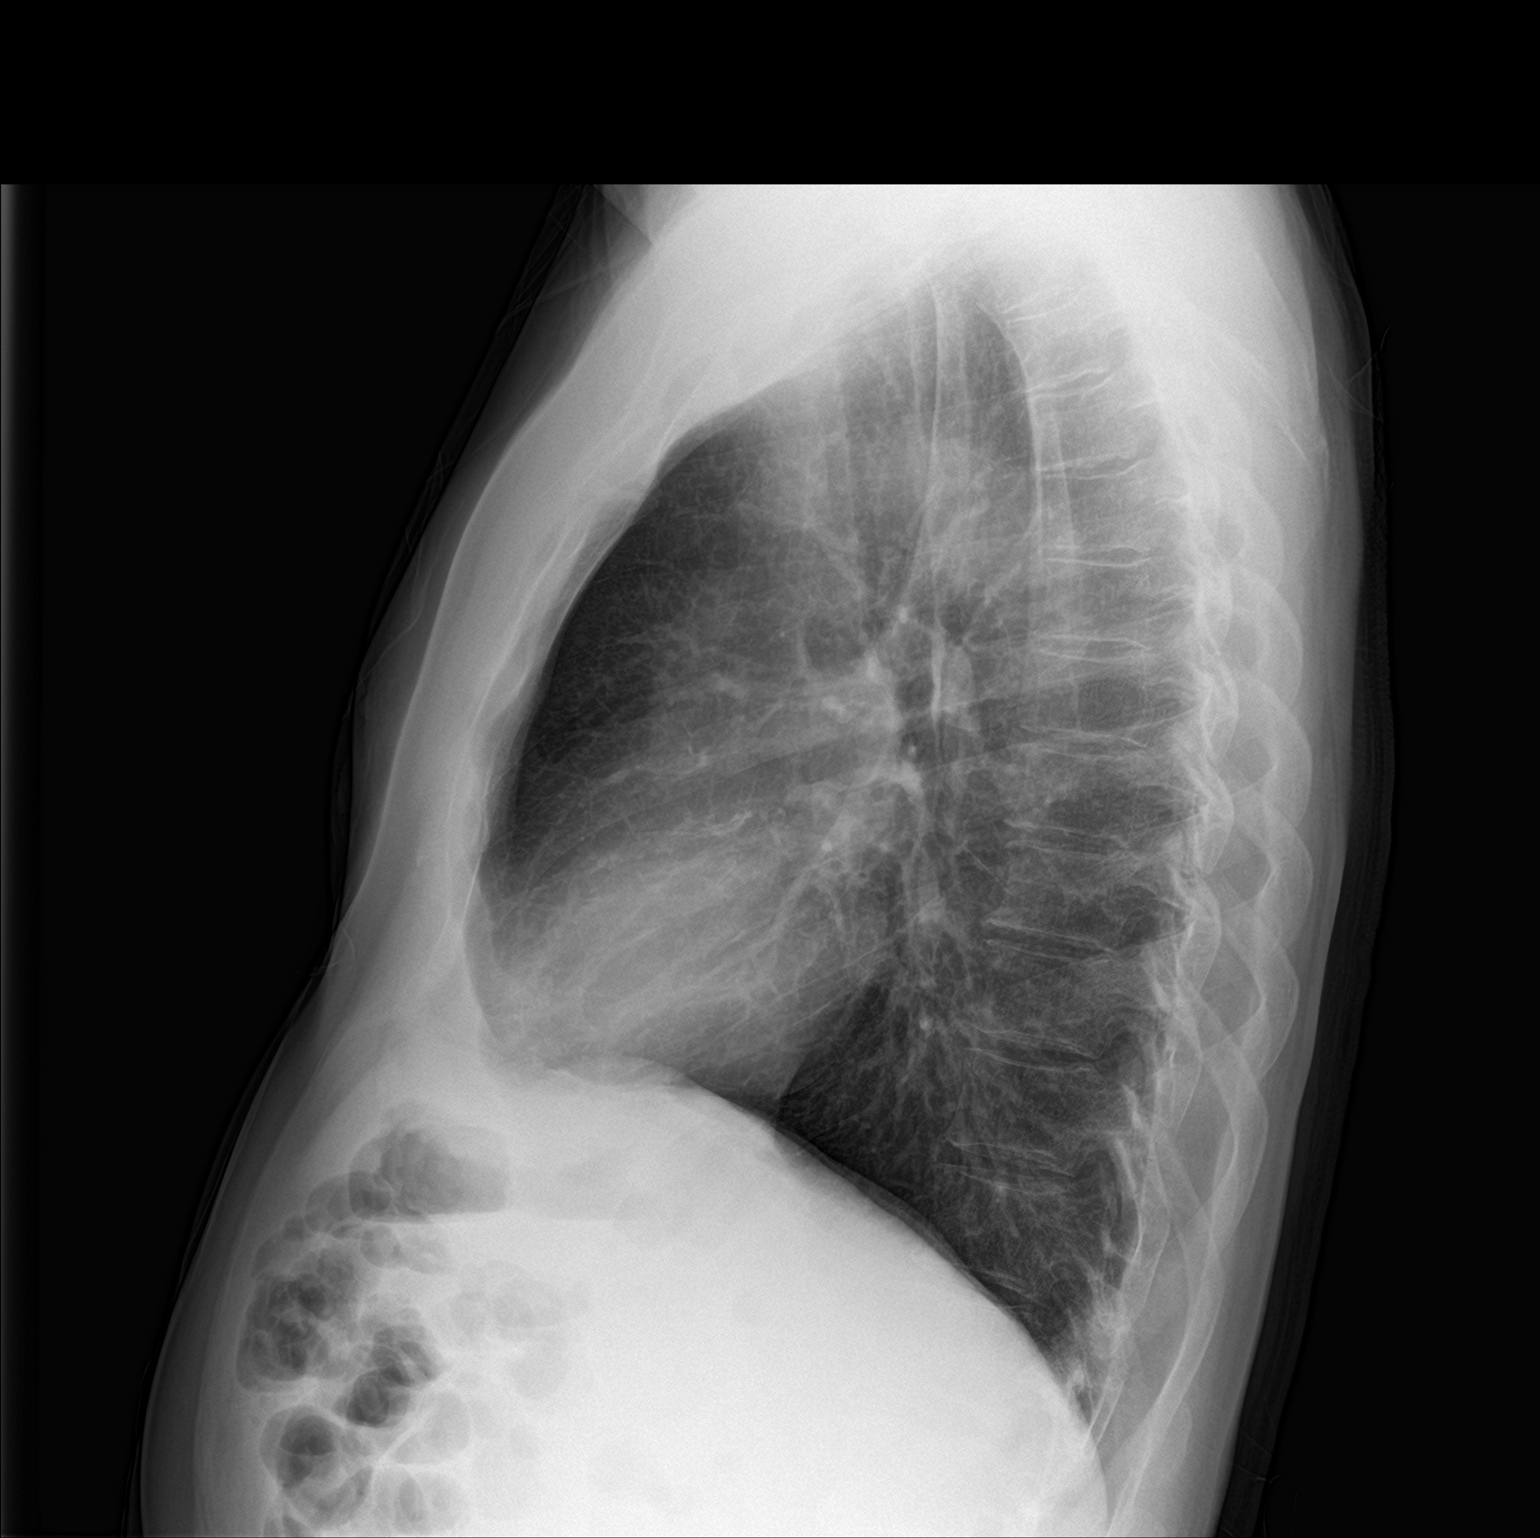

[2 of 2 positions shown; findings below may reference images not displayed]

FINDINGS: Mediastinum and hilar structures normal. Heart size normal. Mild
peribronchial cuffing. Bronchitis cannot be excluded. Mild left base
subsegmental atelectasis and or scarring. No focal infiltrate. No
pleural effusion or pneumothorax. Degenerative change thoracic
spine.
IMPRESSION: Mild peribronchial cuffing. Bronchitis cannot be excluded. Mild left
base subsegmental atelectasis and or scarring.
